# Patient Record
Sex: Female | Born: 1945 | Race: Asian | Hispanic: No | Marital: Married | Smoking: Never smoker
Health system: Southern US, Community
[De-identification: ages and names within clinical notes are randomized; demographics above are authoritative.]

## PROBLEM LIST (undated history)

## (undated) DIAGNOSIS — J45909 Unspecified asthma, uncomplicated: Secondary | ICD-10-CM

## (undated) DIAGNOSIS — Z972 Presence of dental prosthetic device (complete) (partial): Secondary | ICD-10-CM

## (undated) DIAGNOSIS — I10 Essential (primary) hypertension: Secondary | ICD-10-CM

## (undated) DIAGNOSIS — H269 Unspecified cataract: Secondary | ICD-10-CM

## (undated) DIAGNOSIS — K219 Gastro-esophageal reflux disease without esophagitis: Secondary | ICD-10-CM

## (undated) HISTORY — DX: Unspecified asthma, uncomplicated: J45.909

## (undated) HISTORY — PX: EYE SURGERY: SHX253

## (undated) HISTORY — PX: KNEE ARTHROSCOPY: SUR90

## (undated) HISTORY — PX: TUBAL LIGATION: SHX77

## (undated) HISTORY — DX: Unspecified cataract: H26.9

## (undated) HISTORY — DX: Essential (primary) hypertension: I10

## (undated) HISTORY — PX: FOOT SURGERY: SHX648

---

## 2004-08-13 ENCOUNTER — Ambulatory Visit: Payer: Self-pay | Admitting: Unknown Physician Specialty

## 2005-10-07 ENCOUNTER — Ambulatory Visit: Payer: Self-pay | Admitting: Internal Medicine

## 2006-03-24 ENCOUNTER — Ambulatory Visit: Payer: Self-pay | Admitting: Internal Medicine

## 2006-05-26 ENCOUNTER — Ambulatory Visit: Payer: Self-pay | Admitting: Gastroenterology

## 2006-10-12 ENCOUNTER — Ambulatory Visit: Payer: Self-pay | Admitting: Internal Medicine

## 2006-11-21 ENCOUNTER — Emergency Department: Payer: Self-pay | Admitting: Emergency Medicine

## 2007-10-16 ENCOUNTER — Ambulatory Visit: Payer: Self-pay | Admitting: Internal Medicine

## 2008-10-18 ENCOUNTER — Ambulatory Visit: Payer: Self-pay | Admitting: Internal Medicine

## 2008-12-20 ENCOUNTER — Ambulatory Visit: Payer: Self-pay | Admitting: Internal Medicine

## 2009-11-02 ENCOUNTER — Emergency Department: Payer: Self-pay | Admitting: Emergency Medicine

## 2009-12-04 ENCOUNTER — Ambulatory Visit: Payer: Self-pay | Admitting: Unknown Physician Specialty

## 2010-01-06 ENCOUNTER — Ambulatory Visit: Payer: Self-pay | Admitting: Unknown Physician Specialty

## 2010-02-05 ENCOUNTER — Ambulatory Visit: Payer: Self-pay | Admitting: Internal Medicine

## 2010-07-20 ENCOUNTER — Ambulatory Visit: Payer: Self-pay

## 2011-08-19 ENCOUNTER — Ambulatory Visit: Payer: Self-pay | Admitting: Obstetrics and Gynecology

## 2012-11-07 ENCOUNTER — Ambulatory Visit: Payer: Self-pay | Admitting: Obstetrics and Gynecology

## 2012-11-28 ENCOUNTER — Ambulatory Visit: Payer: Self-pay | Admitting: Obstetrics and Gynecology

## 2012-12-19 ENCOUNTER — Ambulatory Visit: Payer: Self-pay | Admitting: Obstetrics and Gynecology

## 2013-01-29 ENCOUNTER — Ambulatory Visit: Payer: Self-pay | Admitting: Internal Medicine

## 2013-06-13 ENCOUNTER — Ambulatory Visit: Payer: Self-pay | Admitting: Surgery

## 2013-12-20 ENCOUNTER — Ambulatory Visit: Payer: Self-pay | Admitting: Internal Medicine

## 2014-02-06 ENCOUNTER — Ambulatory Visit: Payer: Self-pay | Admitting: Internal Medicine

## 2014-09-27 ENCOUNTER — Emergency Department: Payer: Self-pay | Admitting: Emergency Medicine

## 2014-10-08 ENCOUNTER — Emergency Department: Payer: Self-pay | Admitting: Emergency Medicine

## 2014-12-11 ENCOUNTER — Ambulatory Visit
Admit: 2014-12-11 | Disposition: A | Discharge status: Home / Self Care | Payer: Self-pay | Attending: Internal Medicine | Admitting: Internal Medicine

## 2015-03-10 HISTORY — PX: TEAR DUCT PROBING: SHX793

## 2015-03-11 ENCOUNTER — Ambulatory Visit
Admission: RE | Admit: 2015-03-11 | Discharge: 2015-03-11 | Disposition: A | Discharge status: Home / Self Care | Payer: PPO | Source: Ambulatory Visit | Attending: Internal Medicine | Admitting: Internal Medicine

## 2015-03-11 ENCOUNTER — Other Ambulatory Visit: Payer: Self-pay | Admitting: Internal Medicine

## 2015-03-11 ENCOUNTER — Ambulatory Visit
Admission: RE | Admit: 2015-03-11 | Discharge: 2015-03-11 | Disposition: A | Discharge status: Home / Self Care | Payer: PPO | Source: Ambulatory Visit | Attending: Cardiology | Admitting: Cardiology

## 2015-03-11 DIAGNOSIS — M25561 Pain in right knee: Secondary | ICD-10-CM | POA: Diagnosis present

## 2015-03-11 DIAGNOSIS — M1711 Unilateral primary osteoarthritis, right knee: Secondary | ICD-10-CM | POA: Insufficient documentation

## 2015-03-11 DIAGNOSIS — M898X5 Other specified disorders of bone, thigh: Secondary | ICD-10-CM

## 2015-03-11 DIAGNOSIS — R05 Cough: Secondary | ICD-10-CM

## 2015-03-11 DIAGNOSIS — R059 Cough, unspecified: Secondary | ICD-10-CM

## 2015-03-11 DIAGNOSIS — M79604 Pain in right leg: Secondary | ICD-10-CM | POA: Diagnosis present

## 2015-03-13 DIAGNOSIS — K219 Gastro-esophageal reflux disease without esophagitis: Secondary | ICD-10-CM | POA: Insufficient documentation

## 2015-03-13 DIAGNOSIS — I10 Essential (primary) hypertension: Secondary | ICD-10-CM | POA: Insufficient documentation

## 2015-04-24 ENCOUNTER — Ambulatory Visit (INDEPENDENT_AMBULATORY_CARE_PROVIDER_SITE_OTHER): Payer: PPO | Admitting: Obstetrics and Gynecology

## 2015-04-24 ENCOUNTER — Encounter: Payer: Self-pay | Admitting: Obstetrics and Gynecology

## 2015-04-24 VITALS — BP 170/73 | HR 93 | Resp 16 | Ht 63.0 in | Wt 170.5 lb

## 2015-04-24 DIAGNOSIS — N814 Uterovaginal prolapse, unspecified: Secondary | ICD-10-CM | POA: Diagnosis not present

## 2015-04-24 DIAGNOSIS — Z4689 Encounter for fitting and adjustment of other specified devices: Secondary | ICD-10-CM | POA: Diagnosis not present

## 2015-04-24 NOTE — Progress Notes (Signed)
GYNECOLOGY PROGRESS NOTE  Subjective:    Patient ID: Amanda Cruz, female    DOB: 1945/09/21, 69 y.o.   MRN: [id number]  HPI  Patient is a 69 y.o. No obstetric history on file. female who presents for a pessary check. She reports no vaginal bleeding or discharge. She denies pelvic discomfort and difficulty urinating or moving her bowels. ?  The following portions of the patient's history were reviewed and updated as appropriate: allergies, current medications, past family history, past medical history, past social history, past surgical history and problem list.  Review of Systems A comprehensive review of systems was negative.   Objective:   There were no vitals taken for this visit. The patient's Size 4 ring pessary with support was removed, cleaned and replaced without complications. Speculum examination revealed normal vaginal mucosa with no lesions or lacerations.    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  Rubie Maid, MD [facility]

## 2015-07-03 ENCOUNTER — Ambulatory Visit: Payer: PPO | Admitting: Obstetrics and Gynecology

## 2015-10-16 ENCOUNTER — Ambulatory Visit (INDEPENDENT_AMBULATORY_CARE_PROVIDER_SITE_OTHER): Payer: PPO | Admitting: Obstetrics and Gynecology

## 2015-10-16 ENCOUNTER — Encounter: Payer: Self-pay | Admitting: Obstetrics and Gynecology

## 2015-10-16 VITALS — BP 184/85 | HR 102 | Ht 63.0 in | Wt 166.3 lb

## 2015-10-16 DIAGNOSIS — I83899 Varicose veins of unspecified lower extremities with other complications: Secondary | ICD-10-CM | POA: Diagnosis not present

## 2015-10-16 DIAGNOSIS — N950 Postmenopausal bleeding: Secondary | ICD-10-CM

## 2015-10-16 DIAGNOSIS — R05 Cough: Secondary | ICD-10-CM | POA: Diagnosis not present

## 2015-10-16 DIAGNOSIS — R0689 Other abnormalities of breathing: Secondary | ICD-10-CM | POA: Diagnosis not present

## 2015-10-16 DIAGNOSIS — R0600 Dyspnea, unspecified: Secondary | ICD-10-CM | POA: Diagnosis not present

## 2015-10-16 NOTE — Progress Notes (Signed)
    GYNECOLOGY PROGRESS NOTE  Subjective:    Patient ID: Amanda Cruz, female    DOB: 10/28/1945, 70 y.o.   MRN: [id number]  HPI  Patient is a 70 y.o. Marland Kitchen female who presents for complaints of PMB. Patient with pessary in place for cystocele (Stage III) with uterine prolapse (Stage II). Has not had pessary checked in 6 months due to caring for family member with ailments. Notes that the bleeding has been ongoing x 1 month.  Bleeding is described as spotting to light bleeding, occuring almost daily.  Denies any pelvic pain, passage of clots, dysuria or hematuria. Notes that she does not use her Trimo-San gel regularly as prescribed  The following portions of the patient's history were reviewed and updated as appropriate: allergies, current medications, past family history, past medical history, past social history, past surgical history and problem list.  Review of Systems A comprehensive review of systems was negative except for: Respiratory: positive for cough   Objective:   Blood pressure 184/85, pulse 102, height 5\' 3"  (1.6 m), weight 166 lb 4.8 oz (75.433 kg). General appearance: alert and no distress Abdomen: soft, non-tender; bowel sounds normal; no masses,  no organomegaly Pelvic:  External genitalia was normal.  The patient's Size 4 ring pessary with support was removed and cleaned. Speculum examination revealed atrophic but otherwise normal vaginal mucosa with no lesions or lacerations.  Cystocele Grade III present. Cervix appeared friable with small amount of blood.  Uterine prolapse (Grade II).  Uterus normal size, shape, consistency.  Adnexae non-palpable.  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  Will order pelvic ultrasound to assess uterine lining; if thickened, will need endometrial biopsy.  Will also order TSH and CBC.  Removed pessary to allow cervix time to heal. Patient to f/u in 2 weeks.  If cervix no longer friable, will reinsert.   Rubie Maid, MD [facility]

## 2015-10-17 LAB — CBC
HEMATOCRIT: 37.6 % (ref 34.0–46.6)
HEMOGLOBIN: 13 g/dL (ref 11.1–15.9)
MCH: 30.1 pg (ref 26.6–33.0)
MCHC: 34.6 g/dL (ref 31.5–35.7)
MCV: 87 fL (ref 79–97)
NRBC: 0 % (ref 0–0)
Platelets: 189 10*3/uL (ref 150–379)
RBC: 4.32 x10E6/uL (ref 3.77–5.28)
RDW: 13.1 % (ref 12.3–15.4)
WBC: 4 10*3/uL (ref 3.4–10.8)

## 2015-10-17 LAB — TSH: TSH: 1.52 u[IU]/mL (ref 0.450–4.500)

## 2015-11-04 ENCOUNTER — Ambulatory Visit (INDEPENDENT_AMBULATORY_CARE_PROVIDER_SITE_OTHER): Payer: PPO | Admitting: Obstetrics and Gynecology

## 2015-11-04 ENCOUNTER — Ambulatory Visit (INDEPENDENT_AMBULATORY_CARE_PROVIDER_SITE_OTHER): Payer: PPO

## 2015-11-04 ENCOUNTER — Encounter: Payer: Self-pay | Admitting: Obstetrics and Gynecology

## 2015-11-04 VITALS — BP 169/73 | HR 80 | Ht 63.0 in | Wt 167.1 lb

## 2015-11-04 DIAGNOSIS — N950 Postmenopausal bleeding: Secondary | ICD-10-CM

## 2015-11-04 DIAGNOSIS — D259 Leiomyoma of uterus, unspecified: Secondary | ICD-10-CM

## 2015-11-04 DIAGNOSIS — N814 Uterovaginal prolapse, unspecified: Secondary | ICD-10-CM | POA: Diagnosis not present

## 2015-11-04 DIAGNOSIS — N952 Postmenopausal atrophic vaginitis: Secondary | ICD-10-CM | POA: Diagnosis not present

## 2015-11-04 MED ORDER — OXYQUINOLONE SULFATE 0.025 % VA GEL: 1.0000 | VAGINAL | Status: DC

## 2015-11-09 NOTE — Progress Notes (Signed)
GYNECOLOGY PROGRESS NOTE  Subjective:    Patient ID: Amanda Cruz, female    DOB: 1945-12-16, 70 y.o.   MRN: [id number]  HPI  Patient is a 70 y.o. Marland Kitchen female who presents for f/u of ultrasound for complaints of PMB. Patient was instructed last visit to leave pessary out x 2 weeks in case it was causing vaginal irritation and bleeding.  Notes that she left if out for ~ 1 week, but was having difficulty voiding without it, so she reinserted it.  Has not had any further bleeding episodes.  Reported last visit that she does not use her Trimo-San gel regularly as prescribed, and had left pessary in place x 6 months without having it removed and cleaned.   The following portions of the patient's history were reviewed and updated as appropriate: allergies, current medications, past family history, past medical history, past social history, past surgical history and problem list.  Review of Systems A comprehensive review of systems was negative except for: Respiratory: positive for cough   Objective:   Blood pressure 169/73, pulse 80, height 5\' 3"  (1.6 m), weight 167 lb 1.6 oz (75.796 kg). General appearance: alert and no distress Abdomen: soft, non-tender; bowel sounds normal; no masses,  no organomegaly Pelvic:  External genitalia was normal.   Speculum examination revealed atrophic but otherwise normal vaginal mucosa with no lesions or lacerations.  No blood in vaginal vault.  Cystocele Grade III present. Cervix appeared less friable. Uterine prolapse (Grade II).  Uterus normal size, shape, consistency.  Adnexae non-palpable.  Extremities: extremities normal, atraumatic, no cyanosis or edema and venous stasis dermatitis noted on right lower leg Neurologic: Grossly normal    Labs:  Results for orders placed or performed in visit on 10/16/15  TSH  Result Value Ref Range   TSH 1.520 0.450 - 4.500 uIU/mL  CBC  Result Value Ref Range   WBC 4.0 3.4 - 10.8 x10E3/uL   RBC 4.32 3.77 - 5.28  x10E6/uL   Hemoglobin 13.0 11.1 - 15.9 g/dL   Hematocrit 37.6 34.0 - 46.6 %   MCV 87 79 - 97 fL   MCH 30.1 26.6 - 33.0 pg   MCHC 34.6 31.5 - 35.7 g/dL   RDW 13.1 12.3 - 15.4 %   Platelets 189 150 - 379 x10E3/uL   NRBC 0 0-0 %    Imaging 11/04/2015:  Indications:PMB Findings:  The uterus measures 5.8 x 2.1 x 3.7 cm. Echo texture is heterogenous with evidence of focal mass. Within the uterus is a suspected fibroid measuring: Fibroid 1: left lower uterine segment 1 x 1 x .9cm The Endometrium measures 4.9 mm.  Ovaries are not visualized.  Survey of the adnexa demonstrates no adnexal masses.  There is no free fluid in the cul de sac.  Impression: 1. Fibroid left LUS 2. Endometrium 4.9 mm   Recommendations: 1.Clinical correlation with the patient's History and Physical Exam.   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  No need for endometrial biopsy at this time. TSH and CBC also normal.  Discussed need to use Trimo-san gel 1-2 x weekly. Still declines use of estrogen cream for pessary maintenance and treatment for vaginal atrophy. Reinserted vaginal pessary (  size 4 ring with support) today.  RTC in 2-3 months for next pessary check.    A total of 15 minutes were spent face-to-face with the patient during this encounter and over half of that time dealt with counseling and coordination of care.   Rubie Maid, MD [facility]

## 2016-01-21 DIAGNOSIS — M170 Bilateral primary osteoarthritis of knee: Secondary | ICD-10-CM | POA: Diagnosis not present

## 2016-02-05 ENCOUNTER — Ambulatory Visit (INDEPENDENT_AMBULATORY_CARE_PROVIDER_SITE_OTHER): Payer: PPO | Admitting: Obstetrics and Gynecology

## 2016-02-05 ENCOUNTER — Encounter: Payer: Self-pay | Admitting: Obstetrics and Gynecology

## 2016-02-05 VITALS — BP 168/70 | HR 88 | Ht 63.0 in | Wt 165.5 lb

## 2016-02-05 DIAGNOSIS — Z4689 Encounter for fitting and adjustment of other specified devices: Secondary | ICD-10-CM | POA: Diagnosis not present

## 2016-02-05 DIAGNOSIS — F4321 Adjustment disorder with depressed mood: Secondary | ICD-10-CM

## 2016-02-05 DIAGNOSIS — Z634 Disappearance and death of family member: Secondary | ICD-10-CM

## 2016-02-08 NOTE — Progress Notes (Signed)
   GYNECOLOGY PROGRESS NOTE  Subjective:    Patient ID: Amanda Cruz, female    DOB: [birth year], 70 y.o.   MRN: [id number]  HPI  Patient is a 70 y.o. female who presents for a pessary check. She reports no vaginal bleeding or discharge. She denies pelvic discomfort and difficulty urinating or moving her bowels. ?  The following portions of the patient's history were reviewed and updated as appropriate: allergies, current medications, past family history, past medical history, past social history, past surgical history and problem list.   Review of Systems Behavioral/Psych: positive for sadness and grief.  Notes that her son recently passed away ~ 6 weeks ago   Objective:   Blood pressure 168/70, pulse 88, height 5\' 3"  (1.6 m), weight 165 lb 8 oz (75.07 kg). Gen App: NAD, sad Pelvis:The patient's Size 4 ring pessary with support was removed, cleaned and replaced without complications. Speculum examination revealed normal vaginal mucosa with no lesions or lacerations. Psych: normal though process, no agitation, no SI/HI, tearful when discussing her son.     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 Patient currently grieving, no evidence of depression at this time.  Offered comfort to patient, advised to notify MD if symptoms change, she begins having harmful thoughts, unable to perform ADL due to sadness/depression.     Rubie Maid, MD [facility]

## 2016-07-28 DIAGNOSIS — H2513 Age-related nuclear cataract, bilateral: Secondary | ICD-10-CM | POA: Diagnosis not present

## 2017-06-28 DIAGNOSIS — M7711 Lateral epicondylitis, right elbow: Secondary | ICD-10-CM | POA: Diagnosis not present

## 2017-06-28 DIAGNOSIS — I83899 Varicose veins of unspecified lower extremities with other complications: Secondary | ICD-10-CM | POA: Diagnosis not present

## 2017-06-28 DIAGNOSIS — I10 Essential (primary) hypertension: Secondary | ICD-10-CM | POA: Diagnosis not present

## 2017-06-28 DIAGNOSIS — Z23 Encounter for immunization: Secondary | ICD-10-CM | POA: Diagnosis not present

## 2017-06-28 DIAGNOSIS — R0600 Dyspnea, unspecified: Secondary | ICD-10-CM | POA: Diagnosis not present

## 2017-07-04 DIAGNOSIS — L309 Dermatitis, unspecified: Secondary | ICD-10-CM | POA: Diagnosis not present

## 2017-07-12 DIAGNOSIS — I8312 Varicose veins of left lower extremity with inflammation: Secondary | ICD-10-CM | POA: Diagnosis not present

## 2017-07-12 DIAGNOSIS — I10 Essential (primary) hypertension: Secondary | ICD-10-CM | POA: Diagnosis not present

## 2017-07-12 DIAGNOSIS — I8311 Varicose veins of right lower extremity with inflammation: Secondary | ICD-10-CM | POA: Diagnosis not present

## 2017-07-12 DIAGNOSIS — M7711 Lateral epicondylitis, right elbow: Secondary | ICD-10-CM | POA: Diagnosis not present

## 2017-08-01 DIAGNOSIS — I10 Essential (primary) hypertension: Secondary | ICD-10-CM | POA: Diagnosis not present

## 2017-08-01 DIAGNOSIS — E7849 Other hyperlipidemia: Secondary | ICD-10-CM | POA: Diagnosis not present

## 2017-08-01 DIAGNOSIS — R5381 Other malaise: Secondary | ICD-10-CM | POA: Diagnosis not present

## 2017-08-01 DIAGNOSIS — E119 Type 2 diabetes mellitus without complications: Secondary | ICD-10-CM | POA: Diagnosis not present

## 2017-08-04 DIAGNOSIS — Z0000 Encounter for general adult medical examination without abnormal findings: Secondary | ICD-10-CM | POA: Diagnosis not present

## 2017-08-15 DIAGNOSIS — L209 Atopic dermatitis, unspecified: Secondary | ICD-10-CM | POA: Diagnosis not present

## 2017-08-15 DIAGNOSIS — I8393 Asymptomatic varicose veins of bilateral lower extremities: Secondary | ICD-10-CM | POA: Diagnosis not present

## 2017-08-15 DIAGNOSIS — I781 Nevus, non-neoplastic: Secondary | ICD-10-CM | POA: Diagnosis not present

## 2017-09-26 DIAGNOSIS — R21 Rash and other nonspecific skin eruption: Secondary | ICD-10-CM | POA: Diagnosis not present

## 2017-09-26 DIAGNOSIS — L299 Pruritus, unspecified: Secondary | ICD-10-CM | POA: Diagnosis not present

## 2017-09-26 DIAGNOSIS — D485 Neoplasm of uncertain behavior of skin: Secondary | ICD-10-CM | POA: Diagnosis not present

## 2017-09-26 DIAGNOSIS — L309 Dermatitis, unspecified: Secondary | ICD-10-CM | POA: Diagnosis not present

## 2017-10-12 DIAGNOSIS — I8311 Varicose veins of right lower extremity with inflammation: Secondary | ICD-10-CM | POA: Diagnosis not present

## 2017-10-12 DIAGNOSIS — B353 Tinea pedis: Secondary | ICD-10-CM | POA: Diagnosis not present

## 2017-10-12 DIAGNOSIS — L578 Other skin changes due to chronic exposure to nonionizing radiation: Secondary | ICD-10-CM | POA: Diagnosis not present

## 2017-10-13 DIAGNOSIS — I83899 Varicose veins of unspecified lower extremities with other complications: Secondary | ICD-10-CM | POA: Diagnosis not present

## 2017-10-13 DIAGNOSIS — I10 Essential (primary) hypertension: Secondary | ICD-10-CM | POA: Diagnosis not present

## 2017-10-13 DIAGNOSIS — R0600 Dyspnea, unspecified: Secondary | ICD-10-CM | POA: Diagnosis not present

## 2017-10-13 DIAGNOSIS — J441 Chronic obstructive pulmonary disease with (acute) exacerbation: Secondary | ICD-10-CM | POA: Diagnosis not present

## 2017-10-20 DIAGNOSIS — H2513 Age-related nuclear cataract, bilateral: Secondary | ICD-10-CM | POA: Diagnosis not present

## 2017-11-03 DIAGNOSIS — J441 Chronic obstructive pulmonary disease with (acute) exacerbation: Secondary | ICD-10-CM | POA: Diagnosis not present

## 2017-11-03 DIAGNOSIS — I10 Essential (primary) hypertension: Secondary | ICD-10-CM | POA: Diagnosis not present

## 2017-11-03 DIAGNOSIS — I8311 Varicose veins of right lower extremity with inflammation: Secondary | ICD-10-CM | POA: Diagnosis not present

## 2017-11-03 DIAGNOSIS — R0600 Dyspnea, unspecified: Secondary | ICD-10-CM | POA: Diagnosis not present

## 2017-12-20 DIAGNOSIS — R0600 Dyspnea, unspecified: Secondary | ICD-10-CM | POA: Diagnosis not present

## 2017-12-20 DIAGNOSIS — I10 Essential (primary) hypertension: Secondary | ICD-10-CM | POA: Diagnosis not present

## 2017-12-20 DIAGNOSIS — J441 Chronic obstructive pulmonary disease with (acute) exacerbation: Secondary | ICD-10-CM | POA: Diagnosis not present

## 2017-12-20 DIAGNOSIS — I8311 Varicose veins of right lower extremity with inflammation: Secondary | ICD-10-CM | POA: Diagnosis not present

## 2017-12-21 DIAGNOSIS — R5381 Other malaise: Secondary | ICD-10-CM | POA: Diagnosis not present

## 2017-12-21 DIAGNOSIS — R5383 Other fatigue: Secondary | ICD-10-CM | POA: Diagnosis not present

## 2017-12-21 DIAGNOSIS — R531 Weakness: Secondary | ICD-10-CM | POA: Diagnosis not present

## 2017-12-21 DIAGNOSIS — R109 Unspecified abdominal pain: Secondary | ICD-10-CM | POA: Diagnosis not present

## 2017-12-23 DIAGNOSIS — I8311 Varicose veins of right lower extremity with inflammation: Secondary | ICD-10-CM | POA: Diagnosis not present

## 2017-12-23 DIAGNOSIS — M25562 Pain in left knee: Secondary | ICD-10-CM | POA: Diagnosis not present

## 2017-12-23 DIAGNOSIS — J441 Chronic obstructive pulmonary disease with (acute) exacerbation: Secondary | ICD-10-CM | POA: Diagnosis not present

## 2017-12-23 DIAGNOSIS — R0600 Dyspnea, unspecified: Secondary | ICD-10-CM | POA: Diagnosis not present

## 2017-12-23 DIAGNOSIS — I10 Essential (primary) hypertension: Secondary | ICD-10-CM | POA: Diagnosis not present

## 2018-01-13 DIAGNOSIS — M25562 Pain in left knee: Secondary | ICD-10-CM | POA: Diagnosis not present

## 2018-01-13 DIAGNOSIS — I8311 Varicose veins of right lower extremity with inflammation: Secondary | ICD-10-CM | POA: Diagnosis not present

## 2018-01-13 DIAGNOSIS — I10 Essential (primary) hypertension: Secondary | ICD-10-CM | POA: Diagnosis not present

## 2018-01-13 DIAGNOSIS — R0600 Dyspnea, unspecified: Secondary | ICD-10-CM | POA: Diagnosis not present

## 2018-03-27 DIAGNOSIS — I8311 Varicose veins of right lower extremity with inflammation: Secondary | ICD-10-CM | POA: Diagnosis not present

## 2018-03-27 DIAGNOSIS — R0600 Dyspnea, unspecified: Secondary | ICD-10-CM | POA: Diagnosis not present

## 2018-03-27 DIAGNOSIS — M25562 Pain in left knee: Secondary | ICD-10-CM | POA: Diagnosis not present

## 2018-03-27 DIAGNOSIS — Z0000 Encounter for general adult medical examination without abnormal findings: Secondary | ICD-10-CM | POA: Diagnosis not present

## 2018-03-27 DIAGNOSIS — J441 Chronic obstructive pulmonary disease with (acute) exacerbation: Secondary | ICD-10-CM | POA: Diagnosis not present

## 2018-03-28 DIAGNOSIS — E119 Type 2 diabetes mellitus without complications: Secondary | ICD-10-CM | POA: Diagnosis not present

## 2018-03-28 DIAGNOSIS — R5381 Other malaise: Secondary | ICD-10-CM | POA: Diagnosis not present

## 2018-03-28 DIAGNOSIS — R531 Weakness: Secondary | ICD-10-CM | POA: Diagnosis not present

## 2018-03-31 ENCOUNTER — Other Ambulatory Visit: Payer: Self-pay

## 2018-03-31 DIAGNOSIS — Z1211 Encounter for screening for malignant neoplasm of colon: Secondary | ICD-10-CM

## 2018-04-24 DIAGNOSIS — R0600 Dyspnea, unspecified: Secondary | ICD-10-CM | POA: Diagnosis not present

## 2018-04-24 DIAGNOSIS — I8311 Varicose veins of right lower extremity with inflammation: Secondary | ICD-10-CM | POA: Diagnosis not present

## 2018-04-24 DIAGNOSIS — J441 Chronic obstructive pulmonary disease with (acute) exacerbation: Secondary | ICD-10-CM | POA: Diagnosis not present

## 2018-04-24 DIAGNOSIS — M25562 Pain in left knee: Secondary | ICD-10-CM | POA: Diagnosis not present

## 2018-05-08 ENCOUNTER — Encounter: Payer: Self-pay | Admitting: *Deleted

## 2018-05-09 ENCOUNTER — Ambulatory Visit: Payer: PPO | Admitting: Certified Registered"

## 2018-05-09 ENCOUNTER — Ambulatory Visit
Admission: RE | Admit: 2018-05-09 | Discharge: 2018-05-09 | Disposition: A | Discharge status: Home / Self Care | Payer: PPO | Source: Ambulatory Visit | Attending: Gastroenterology | Admitting: Gastroenterology

## 2018-05-09 ENCOUNTER — Encounter: Payer: Self-pay | Admitting: Emergency Medicine

## 2018-05-09 ENCOUNTER — Encounter
Admission: RE | Disposition: A | Discharge status: Home / Self Care | Payer: Self-pay | Source: Ambulatory Visit | Attending: Gastroenterology

## 2018-05-09 DIAGNOSIS — Z1211 Encounter for screening for malignant neoplasm of colon: Secondary | ICD-10-CM | POA: Diagnosis not present

## 2018-05-09 DIAGNOSIS — I10 Essential (primary) hypertension: Secondary | ICD-10-CM | POA: Insufficient documentation

## 2018-05-09 DIAGNOSIS — K219 Gastro-esophageal reflux disease without esophagitis: Secondary | ICD-10-CM | POA: Diagnosis not present

## 2018-05-09 DIAGNOSIS — K5790 Diverticulosis of intestine, part unspecified, without perforation or abscess without bleeding: Secondary | ICD-10-CM | POA: Diagnosis not present

## 2018-05-09 DIAGNOSIS — K640 First degree hemorrhoids: Secondary | ICD-10-CM | POA: Diagnosis not present

## 2018-05-09 DIAGNOSIS — K5730 Diverticulosis of large intestine without perforation or abscess without bleeding: Secondary | ICD-10-CM | POA: Insufficient documentation

## 2018-05-09 DIAGNOSIS — D125 Benign neoplasm of sigmoid colon: Secondary | ICD-10-CM

## 2018-05-09 DIAGNOSIS — K635 Polyp of colon: Secondary | ICD-10-CM

## 2018-05-09 DIAGNOSIS — Z0000 Encounter for general adult medical examination without abnormal findings: Secondary | ICD-10-CM

## 2018-05-09 HISTORY — PX: COLONOSCOPY WITH PROPOFOL: SHX5780

## 2018-05-09 SURGERY — COLONOSCOPY WITH PROPOFOL
Anesthesia: General

## 2018-05-09 MED ORDER — SODIUM CHLORIDE 0.9 % IV SOLN
INTRAVENOUS | Status: DC
  Administered 2018-05-09: 1000 mL via INTRAVENOUS

## 2018-05-09 MED ORDER — PROPOFOL 10 MG/ML IV BOLUS
INTRAVENOUS | Status: DC | PRN
  Administered 2018-05-09: 20 mg via INTRAVENOUS
  Administered 2018-05-09: 70 mg via INTRAVENOUS

## 2018-05-09 MED ORDER — PROPOFOL 500 MG/50ML IV EMUL
INTRAVENOUS | Status: DC | PRN
  Administered 2018-05-09: 120 ug/kg/min via INTRAVENOUS

## 2018-05-09 MED ORDER — FENTANYL CITRATE (PF) 100 MCG/2ML IJ SOLN: 25.0000 ug | INTRAMUSCULAR | Status: DC | PRN

## 2018-05-09 MED ORDER — PROPOFOL 500 MG/50ML IV EMUL
INTRAVENOUS | Status: AC
  Filled 2018-05-09: qty 50

## 2018-05-09 MED ORDER — ONDANSETRON HCL 4 MG/2ML IJ SOLN: 4.0000 mg | Freq: Once | INTRAMUSCULAR | Status: DC | PRN

## 2018-05-09 MED ORDER — LIDOCAINE HCL (CARDIAC) PF 100 MG/5ML IV SOSY
PREFILLED_SYRINGE | INTRAVENOUS | Status: DC | PRN
  Administered 2018-05-09: 50 mg via INTRAVENOUS

## 2018-05-09 NOTE — Anesthesia Procedure Notes (Signed)
Performed by: Xai Frerking, CRNA Pre-anesthesia Checklist: Patient identified, Emergency Drugs available, Suction available, Patient being monitored and Timeout performed Patient Re-evaluated:Patient Re-evaluated prior to induction Oxygen Delivery Method: Nasal cannula Induction Type: IV induction       

## 2018-05-09 NOTE — H&P (Signed)
[name], MD [address] [place], [place] [address] Phone: [phone] Fax : [phone]  Primary Care Physician:  [name], MD Primary Gastroenterologist:  Dr. Allen Norris  Pre-Procedure History & Physical: HPI:  Amanda Cruz is a 72 y.o. female is here for a screening colonoscopy.   Past Medical History:  Diagnosis Date  . Hypertension     Past Surgical History:  Procedure Laterality Date  . TEAR DUCT PROBING  03/10/2015  . TUBAL LIGATION      Prior to Admission medications   Medication Sig Start Date End Date Taking? Authorizing Provider  metoprolol (LOPRESSOR) 50 MG tablet  10/03/15  Yes [provider]  metoprolol succinate (TOPROL-XL) 50 MG 24 hr tablet Take 50 mg by mouth.   Yes [provider]  OXYQUINOLONE SULFATE VAGINAL (TRIMO-SAN) 0.025 % GEL Place 1 Applicatorful vaginally 2 (two) times a week. 11/04/15  Yes Rubie Maid, MD    Allergies as of 03/31/2018  . (No Known Allergies)    Family History  Problem Relation Age of Onset  . Stroke Mother   . Heart failure Mother   . Thyroid disease Paternal Grandfather     Social History   Socioeconomic History  . Marital status: Married    Spouse name: Not on file  . Number of children: Not on file  . Years of education: Not on file  . Highest education level: Not on file  Occupational History  . Not on file  Social Needs  . Financial resource strain: Somewhat hard  . Food insecurity:    Worry: Never true    Inability: Never true  . Transportation needs:    Medical: No    Non-medical: No  Tobacco Use  . Smoking status: Never Smoker  . Smokeless tobacco: Never Used  Substance and Sexual Activity  . Alcohol use: Yes    Alcohol/week: 1.0 standard drinks    Types: 1 Glasses of wine per week  . Drug use: No  . Sexual activity: Not Currently    Birth control/protection: Post-menopausal  Lifestyle  . Physical activity:    Days per week: 0 days    Minutes per  session: 0 min  . Stress: To some extent  Relationships  . Social connections:    Talks on phone: Patient refused    Gets together: Patient refused    Attends religious service: Patient refused    Active member of club or organization: Patient refused    Attends meetings of clubs or organizations: Patient refused    Relationship status: Patient refused  . Intimate partner violence:    Fear of current or ex partner: Patient refused    Emotionally abused: Patient refused    Physically abused: Patient refused    Forced sexual activity: Patient refused  Other Topics Concern  . Not on file  Social History Narrative  . Not on file    Review of Systems: See HPI, otherwise negative ROS  Physical Exam: BP (!) 170/86   Pulse 72   Temp (!) 96.3 F (35.7 C) (Tympanic)   Resp 18   Ht 5\' 3"  (1.6 m)   Wt 75.8 kg   SpO2 97%   BMI 29.58 kg/m  General:   Alert,  pleasant and cooperative in NAD Head:  Normocephalic and atraumatic. Neck:  Supple; no masses or thyromegaly. Lungs:  Clear throughout to auscultation.    Heart:  Regular rate and rhythm. Abdomen:  Soft, nontender and nondistended. Normal bowel sounds,  without guarding, and without rebound.   Neurologic:  Alert and  oriented x4;  grossly normal neurologically.  Impression/Plan: Amanda Cruz is now here to undergo a screening colonoscopy.  Risks, benefits, and alternatives regarding colonoscopy have been reviewed with the patient.  Questions have been answered.  All parties agreeable.

## 2018-05-09 NOTE — Anesthesia Preprocedure Evaluation (Addendum)
Anesthesia Evaluation  Patient identified by MRN, date of birth, ID band Patient awake    Reviewed: Allergy & Precautions, NPO status , Patient's Chart, lab work & pertinent test results, reviewed documented beta blocker date and time   Airway Mallampati: III       Dental  (+) Upper Dentures   Pulmonary neg pulmonary ROS,    Pulmonary exam normal        Cardiovascular hypertension, Pt. on medications and Pt. on home beta blockers Normal cardiovascular exam     Neuro/Psych negative neurological ROS  negative psych ROS   GI/Hepatic Neg liver ROS, GERD  Medicated,  Endo/Other  negative endocrine ROS  Renal/GU negative Renal ROS  negative genitourinary   Musculoskeletal negative musculoskeletal ROS (+)   Abdominal Normal abdominal exam  (+)   Peds negative pediatric ROS (+)  Hematology negative hematology ROS (+)   Anesthesia Other Findings   Reproductive/Obstetrics                            Anesthesia Physical Anesthesia Plan  ASA: II  Anesthesia Plan: General   Post-op Pain Management:    Induction: Intravenous  PONV Risk Score and Plan: Propofol infusion  Airway Management Planned: Nasal Cannula  Additional Equipment:   Intra-op Plan:   Post-operative Plan:   Informed Consent: I have reviewed the patients History and Physical, chart, labs and discussed the procedure including the risks, benefits and alternatives for the proposed anesthesia with the patient or authorized representative who has indicated his/her understanding and acceptance.   Dental advisory given  Plan Discussed with: CRNA and Surgeon  Anesthesia Plan Comments:         Anesthesia Quick Evaluation

## 2018-05-09 NOTE — Anesthesia Post-op Follow-up Note (Signed)
Anesthesia QCDR form completed.        

## 2018-05-09 NOTE — Transfer of Care (Signed)
Immediate Anesthesia Transfer of Care Note  Patient: Amanda Cruz  Procedure(s) Performed: COLONOSCOPY WITH PROPOFOL (N/A )  Patient Location: PACU  Anesthesia Type:General  Level of Consciousness: sedated  Airway & Oxygen Therapy: Patient Spontanous Breathing and Patient connected to nasal cannula oxygen  Post-op Assessment: Report given to RN and Post -op Vital signs reviewed and stable  Post vital signs: Reviewed and stable  Last Vitals:  Vitals Value Taken Time  BP 106/53 05/09/2018  9:06 AM  Temp 35.7 C 05/09/2018  9:00 AM  Pulse 70 05/09/2018  9:06 AM  Resp 14 05/09/2018  9:06 AM  SpO2 97 % 05/09/2018  9:06 AM    Last Pain:  Vitals:   05/09/18 0900  TempSrc: Tympanic  PainSc:          Complications: No apparent anesthesia complications

## 2018-05-09 NOTE — Op Note (Signed)
[facility] Patient Name: Amanda Cruz Procedure Date: 05/09/2018 8:45 AM MRN: [id number] Account #: [id number] Date of Birth: Oct 27, 1945 Admit Type: Outpatient Age: 72 Room: [facility] ENDO ROOM 4 Gender: Female Note Status: Finalized Procedure:            Colonoscopy Indications:          Screening for colorectal malignant neoplasm Providers:            [name] MD, MD Referring MD:         [name], MD (Referring MD) Medicines:            Propofol per Anesthesia Complications:        No immediate complications. Procedure:            Pre-Anesthesia Assessment:                       - Prior to the procedure, a History and Physical was                        performed, and patient medications and allergies were                        reviewed. The patient's tolerance of previous                        anesthesia was also reviewed. The risks and benefits of                        the procedure and the sedation options and risks were                        discussed with the patient. All questions were                        answered, and informed consent was obtained. Prior                        Anticoagulants: The patient has taken no previous                        anticoagulant or antiplatelet agents. ASA Grade                        Assessment: II - A patient with mild systemic disease.                        After reviewing the risks and benefits, the patient was                        deemed in satisfactory condition to undergo the                        procedure.                       After obtaining informed consent, the colonoscope was                        passed under direct vision. Throughout the procedure,  the patient's blood pressure, pulse, and oxygen                        saturations were monitored continuously. The                        Colonoscope was introduced through the anus and        advanced to the the cecum, identified by appendiceal                        orifice and ileocecal valve. The colonoscopy was                        performed without difficulty. The patient tolerated the                        procedure well. The quality of the bowel preparation                        was excellent. Findings:      The perianal and digital rectal examinations were normal.      Two sessile polyps were found in the sigmoid colon. The polyps were 2 to       3 mm in size. These polyps were removed with a cold biopsy forceps.       Resection and retrieval were complete.      Multiple small-mouthed diverticula were found in the sigmoid colon.      Non-bleeding internal hemorrhoids were found during retroflexion. The       hemorrhoids were Grade I (internal hemorrhoids that do not prolapse). Impression:           - Two 2 to 3 mm polyps in the sigmoid colon, removed                        with a cold biopsy forceps. Resected and retrieved.                       - Diverticulosis in the sigmoid colon.                       - Non-bleeding internal hemorrhoids. Recommendation:       - Discharge patient to home.                       - Resume previous diet.                       - Continue present medications.                       - Await pathology results.                       - Repeat colonoscopy in 5 years if polyp adenoma and 10                        years if hyperplastic Procedure Code(s):    --- Professional ---                       [phone], Colonoscopy, flexible; with biopsy, single or  multiple Diagnosis Code(s):    --- Professional ---                       Z12.11, Encounter for screening for malignant neoplasm                        of colon                       D12.5, Benign neoplasm of sigmoid colon CPT copyright 2017 [employer]. All rights reserved. The codes documented in this report are preliminary and upon coder review  may  be revised to meet current compliance requirements. [name] MD, MD 05/09/2018 9:03:36 AM This report has been signed electronically. Number of Addenda: 0 Note Initiated On: 05/09/2018 8:45 AM Scope Withdrawal Time: 0 hours 6 minutes 55 seconds  Total Procedure Duration: 0 hours 11 minutes 29 seconds       [facility]

## 2018-05-10 ENCOUNTER — Encounter: Payer: Self-pay | Admitting: Gastroenterology

## 2018-05-10 LAB — SURGICAL PATHOLOGY

## 2018-05-10 NOTE — Anesthesia Postprocedure Evaluation (Signed)
Anesthesia Post Note  Patient: Amanda Cruz  Procedure(s) Performed: COLONOSCOPY WITH PROPOFOL (N/A )  Patient location during evaluation: PACU Anesthesia Type: General Level of consciousness: awake and alert and oriented Pain management: pain level controlled Vital Signs Assessment: post-procedure vital signs reviewed and stable Respiratory status: spontaneous breathing Cardiovascular status: blood pressure returned to baseline Anesthetic complications: no     Last Vitals:  Vitals:   05/09/18 0920 05/09/18 0930  BP: 135/82 (!) 171/74  Pulse: 72 72  Resp: 17 18  Temp:    SpO2: 99% 99%    Last Pain:  Vitals:   05/10/18 0749  TempSrc:   PainSc: 0-No pain                 Darshana Curnutt

## 2018-05-11 ENCOUNTER — Encounter: Payer: Self-pay | Admitting: Gastroenterology

## 2018-05-26 DIAGNOSIS — I83899 Varicose veins of unspecified lower extremities with other complications: Secondary | ICD-10-CM | POA: Diagnosis not present

## 2018-05-26 DIAGNOSIS — J028 Acute pharyngitis due to other specified organisms: Secondary | ICD-10-CM | POA: Diagnosis not present

## 2018-05-26 DIAGNOSIS — E669 Obesity, unspecified: Secondary | ICD-10-CM | POA: Diagnosis not present

## 2018-05-26 DIAGNOSIS — J209 Acute bronchitis, unspecified: Secondary | ICD-10-CM | POA: Diagnosis not present

## 2018-06-05 DIAGNOSIS — J209 Acute bronchitis, unspecified: Secondary | ICD-10-CM | POA: Diagnosis not present

## 2018-06-05 DIAGNOSIS — I83899 Varicose veins of unspecified lower extremities with other complications: Secondary | ICD-10-CM | POA: Diagnosis not present

## 2018-06-05 DIAGNOSIS — I10 Essential (primary) hypertension: Secondary | ICD-10-CM | POA: Diagnosis not present

## 2018-06-05 DIAGNOSIS — E669 Obesity, unspecified: Secondary | ICD-10-CM | POA: Diagnosis not present

## 2018-06-12 DIAGNOSIS — J40 Bronchitis, not specified as acute or chronic: Secondary | ICD-10-CM | POA: Diagnosis not present

## 2018-07-24 DIAGNOSIS — I8310 Varicose veins of unspecified lower extremity with inflammation: Secondary | ICD-10-CM | POA: Diagnosis not present

## 2018-07-24 DIAGNOSIS — L720 Epidermal cyst: Secondary | ICD-10-CM | POA: Diagnosis not present

## 2018-08-07 ENCOUNTER — Encounter (INDEPENDENT_AMBULATORY_CARE_PROVIDER_SITE_OTHER): Payer: Self-pay | Admitting: Vascular Surgery

## 2018-08-07 ENCOUNTER — Ambulatory Visit (INDEPENDENT_AMBULATORY_CARE_PROVIDER_SITE_OTHER): Payer: PPO | Admitting: Vascular Surgery

## 2018-08-07 VITALS — BP 175/75 | HR 76 | Ht 63.0 in | Wt 168.0 lb

## 2018-08-07 DIAGNOSIS — E782 Mixed hyperlipidemia: Secondary | ICD-10-CM | POA: Diagnosis not present

## 2018-08-07 DIAGNOSIS — I872 Venous insufficiency (chronic) (peripheral): Secondary | ICD-10-CM | POA: Diagnosis not present

## 2018-08-07 DIAGNOSIS — K219 Gastro-esophageal reflux disease without esophagitis: Secondary | ICD-10-CM

## 2018-08-07 DIAGNOSIS — E785 Hyperlipidemia, unspecified: Secondary | ICD-10-CM | POA: Insufficient documentation

## 2018-08-07 DIAGNOSIS — I10 Essential (primary) hypertension: Secondary | ICD-10-CM | POA: Diagnosis not present

## 2018-08-07 NOTE — Progress Notes (Signed)
MRN : [id number]  Amanda Cruz is a 72 y.o. (1946-02-14) female who presents with chief complaint of  Chief Complaint  Patient presents with  . Advice Only    discoloration of bilateral LE  .  History of Present Illness:   Patient is seen for evaluation of leg pain and leg swelling. The patient first noticed the swelling remotely. The swelling is associated with pain and discoloration. The pain and swelling worsens with prolonged dependency and improves with elevation. The pain is unrelated to activity.  The patient notes that in the morning the legs are significantly improved but they steadily worsened throughout the course of the day. The patient also notes a steady worsening of the discoloration in the ankle and shin area.   The patient denies claudication symptoms.  The patient denies symptoms consistent with rest pain.  The patient denies and extensive history of DJD and LS spine disease.  The patient has no had any past angiography, interventions or vascular surgery.  Elevation makes the leg symptoms better, dependency makes them much worse. There is no history of ulcerations. The patient denies any recent changes in medications.  The patient has not been wearing graduated compression.  The patient denies a history of DVT or PE. There is no prior history of phlebitis. There is no history of primary lymphedema.  No history of malignancies. No history of trauma or groin or pelvic surgery. There is no history of radiation treatment to the groin or pelvis  The patient denies amaurosis fugax or recent TIA symptoms. There are no recent neurological changes noted. The patient denies recent episodes of angina or shortness of breath  Current Meds  Medication Sig  . albuterol (PROVENTIL HFA;VENTOLIN HFA) 108 (90 Base) MCG/ACT inhaler Inhale into the lungs.  Marland Kitchen atorvastatin (LIPITOR) 20 MG tablet   . metoprolol (LOPRESSOR) 50 MG tablet     Past Medical History:  Diagnosis Date    . Hypertension     Past Surgical History:  Procedure Laterality Date  . COLONOSCOPY WITH PROPOFOL N/A 05/09/2018   Procedure: COLONOSCOPY WITH PROPOFOL;  Surgeon: [name], MD;  Location: [facility] ENDOSCOPY;  Service: Endoscopy;  Laterality: N/A;  . TEAR DUCT PROBING  03/10/2015  . TUBAL LIGATION      Social History Social History   Tobacco Use  . Smoking status: Never Smoker  . Smokeless tobacco: Never Used  Substance Use Topics  . Alcohol use: Yes    Alcohol/week: 1.0 standard drinks    Types: 1 Glasses of wine per week  . Drug use: No    Family History Family History  Problem Relation Age of Onset  . Stroke Mother   . Heart failure Mother   . Thyroid disease Paternal Grandfather   No family history of bleeding/clotting disorders, porphyria or autoimmune disease   No Known Allergies   REVIEW OF SYSTEMS (Negative unless checked)  Constitutional: [] Weight loss  [] Fever  [] Chills Cardiac: [] Chest pain   [] Chest pressure   [] Palpitations   [] Shortness of breath when laying flat   [] Shortness of breath with exertion. Vascular:  [x] Pain in legs with walking   [x] Pain in legs with dependency  [] History of DVT   [] Phlebitis   [x] Swelling in legs   [] Varicose veins   [] Non-healing ulcers Pulmonary:   [] Uses home oxygen   [] Productive cough   [] Hemoptysis   [] Wheeze  [] COPD   [] Asthma Neurologic:  [] Dizziness   [] Seizures   [] History of stroke   [] History  of TIA  [] Aphasia   [] Vissual changes   [] Weakness or numbness in arm   [] Weakness or numbness in leg Musculoskeletal:   [] Joint swelling   [] Joint pain   [] Low back pain Hematologic:  [] Easy bruising  [] Easy bleeding   [] Hypercoagulable state   [] Anemic Gastrointestinal:  [] Diarrhea   [] Vomiting  [] Gastroesophageal reflux/heartburn   [] Difficulty swallowing. Genitourinary:  [] Chronic kidney disease   [] Difficult urination  [] Frequent urination   [] Blood in urine Skin:  [x] Rashes   [] Ulcers  Psychological:  [] History of  anxiety   []  History of major depression.  Physical Examination  Vitals:   08/07/18 0903  BP: (!) 175/75  Pulse: 76  Weight: 168 lb (76.2 kg)  Height: 5\' 3"  (1.6 m)   Body mass index is 29.76 kg/m. Gen: WD/WN, NAD Head: [place]/AT, No temporalis wasting.  Ear/Nose/Throat: Hearing grossly intact, nares w/o erythema or drainage, poor dentition Eyes: PER, EOMI, sclera nonicteric.  Neck: Supple, no masses.  No bruit or JVD.  Pulmonary:  Good air movement, clear to auscultation bilaterally, no use of accessory muscles.  Cardiac: RRR, normal S1, S2, no Murmurs. Vascular: scattered varicosities present bilaterally.  Moderate to severe venous stasis changes to the legs bilaterally right > left.  2-3+ soft pitting edema. Vessel Right Left  Radial Palpable Palpable  PT Palpable Palpable  DP Palpable Palpable  Gastrointestinal: soft, non-distended. No guarding/no peritoneal signs.  Musculoskeletal: M/S 5/5 throughout.  No deformity or atrophy.  Neurologic: CN 2-12 intact. Pain and light touch intact in extremities.  Symmetrical.  Speech is fluent. Motor exam as listed above. Psychiatric: Judgment intact, Mood & affect appropriate for pt's clinical situation. Dermatologic: No rashes or ulcers noted.  No changes consistent with cellulitis. Lymph : No Cervical lymphadenopathy, no lichenification or skin changes of chronic lymphedema.  CBC Lab Results  Component Value Date   WBC 4.0 10/16/2015   HGB 13.0 10/16/2015   HCT 37.6 10/16/2015   MCV 87 10/16/2015   PLT 189 10/16/2015    BMET No results found for: NA, K, CL, CO2, GLUCOSE, BUN, CREATININE, CALCIUM, GFRNONAA, GFRAA CrCl cannot be calculated (No successful lab value found.).  COAG No results found for: INR, PROTIME  Radiology No results found.   Assessment/Plan 1. Chronic venous insufficiency No surgery or intervention at this point in time.    I have had a long discussion with the patient regarding venous insufficiency and  why it  causes symptoms. I have discussed with the patient the chronic skin changes that accompany venous insufficiency and the long term sequela such as infection and ulceration.  Patient will begin wearing graduated compression stockings class 1 (20-30 mmHg) or compression wraps on a daily basis a prescription was given. The patient will put the stockings on first thing in the morning and removing them in the evening. The patient is instructed specifically not to sleep in the stockings.    In addition, behavioral modification including several periods of elevation of the lower extremities during the day will be continued. I have demonstrated that proper elevation is a position with the ankles at heart level.  The patient is instructed to begin routine exercise, especially walking on a daily basis  Patient should undergo duplex ultrasound of the venous system to ensure that DVT or reflux is not present.  Following the review of the ultrasound the patient will follow up in 2-3 months to reassess the degree of swelling and the control that graduated compression stockings or compression wraps  is offering.   The patient can be assessed for a Lymph Pump at that time - VAS [place] LOWER EXTREMITY VENOUS REFLUX; Future  2. Essential (primary) hypertension Continue antihypertensive medications as already ordered, these medications have been reviewed and there are no changes at this time.   3. Mixed hyperlipidemia Continue statin as ordered and reviewed, no changes at this time   4. Gastroesophageal reflux disease without esophagitis Continue PPI as already ordered, this medication has been reviewed and there are no changes at this time.  Avoidence of caffeine and alcohol  Moderate elevation of the head of the bed      Hortencia Pilar, MD  08/07/2018 9:19 AM

## 2018-08-10 DIAGNOSIS — E669 Obesity, unspecified: Secondary | ICD-10-CM | POA: Diagnosis not present

## 2018-08-10 DIAGNOSIS — J441 Chronic obstructive pulmonary disease with (acute) exacerbation: Secondary | ICD-10-CM | POA: Diagnosis not present

## 2018-08-10 DIAGNOSIS — R0602 Shortness of breath: Secondary | ICD-10-CM | POA: Diagnosis not present

## 2018-08-10 DIAGNOSIS — I10 Essential (primary) hypertension: Secondary | ICD-10-CM | POA: Diagnosis not present

## 2018-08-10 DIAGNOSIS — I83899 Varicose veins of unspecified lower extremities with other complications: Secondary | ICD-10-CM | POA: Diagnosis not present

## 2018-08-14 DIAGNOSIS — R0602 Shortness of breath: Secondary | ICD-10-CM | POA: Diagnosis not present

## 2018-08-14 DIAGNOSIS — J441 Chronic obstructive pulmonary disease with (acute) exacerbation: Secondary | ICD-10-CM | POA: Diagnosis not present

## 2018-08-14 DIAGNOSIS — I10 Essential (primary) hypertension: Secondary | ICD-10-CM | POA: Diagnosis not present

## 2018-08-14 DIAGNOSIS — I83899 Varicose veins of unspecified lower extremities with other complications: Secondary | ICD-10-CM | POA: Diagnosis not present

## 2018-08-17 ENCOUNTER — Ambulatory Visit (INDEPENDENT_AMBULATORY_CARE_PROVIDER_SITE_OTHER): Payer: PPO

## 2018-08-17 ENCOUNTER — Ambulatory Visit (INDEPENDENT_AMBULATORY_CARE_PROVIDER_SITE_OTHER): Payer: PPO | Admitting: Nurse Practitioner

## 2018-08-17 ENCOUNTER — Encounter (INDEPENDENT_AMBULATORY_CARE_PROVIDER_SITE_OTHER): Payer: Self-pay | Admitting: Nurse Practitioner

## 2018-08-17 VITALS — BP 189/77 | HR 74 | Resp 12 | Ht 63.0 in | Wt 168.4 lb

## 2018-08-17 DIAGNOSIS — E782 Mixed hyperlipidemia: Secondary | ICD-10-CM | POA: Diagnosis not present

## 2018-08-17 DIAGNOSIS — I872 Venous insufficiency (chronic) (peripheral): Secondary | ICD-10-CM

## 2018-08-17 DIAGNOSIS — I83893 Varicose veins of bilateral lower extremities with other complications: Secondary | ICD-10-CM | POA: Diagnosis not present

## 2018-08-17 DIAGNOSIS — I10 Essential (primary) hypertension: Secondary | ICD-10-CM

## 2018-08-24 ENCOUNTER — Encounter (INDEPENDENT_AMBULATORY_CARE_PROVIDER_SITE_OTHER): Payer: Self-pay | Admitting: Nurse Practitioner

## 2018-08-24 NOTE — Progress Notes (Addendum)
Subjective:    Patient ID: Amanda Cruz, female    DOB: 1946-07-13, 71 y.o.   MRN: [id number] Chief Complaint  Patient presents with  . Follow-up    HPI  Amanda Cruz is a 72 y.o. female presents today with complaints of pain, leg swelling and varicosities.  She states that the pain is more so localized around her knee area with some instances of a sharp shooting pain.  She also states that she has some burning and stinging in her lower extremities where her varicosities lie.  She has also had more swelling in her lower extremities.  She denies any ulcerations or wounds of her lower extremities.  She denies any recent trauma of her lower extremities.  The patient states that she has had bilateral endovenous laser ablations in the past.  She states that she is having some of the same pain and irritation that she has had previously.  She states that with the bilateral swelling that she has going on at this time it is been difficult to utilize medical grade 1 compression stockings.  She underwent a lower extremity venous reflux study today which revealed reflux at the level of the knee and the great saphenous vein of the right lower extremity.  Her left lower extremity revealed a Baker's cyst measuring 7.62 cm x 2.61 cm x 2.27 cm.  There is no evidence of DVT bilaterally there was no evidence of superficial venous thrombosis bilaterally.  Her right lower extremity also demonstrates that the great saphenous vein is not visualized from the mid to distal portion.  Past Medical History:  Diagnosis Date  . Hypertension     Past Surgical History:  Procedure Laterality Date  . COLONOSCOPY WITH PROPOFOL N/A 05/09/2018   Procedure: COLONOSCOPY WITH PROPOFOL;  Surgeon: [name], MD;  Location: [facility] ENDOSCOPY;  Service: Endoscopy;  Laterality: N/A;  . TEAR DUCT PROBING  03/10/2015  . TUBAL LIGATION      Social History   Socioeconomic History  . Marital status: Married    Spouse name:  Not on file  . Number of children: Not on file  . Years of education: Not on file  . Highest education level: Not on file  Occupational History  . Not on file  Social Needs  . Financial resource strain: Somewhat hard  . Food insecurity:    Worry: Never true    Inability: Never true  . Transportation needs:    Medical: No    Non-medical: No  Tobacco Use  . Smoking status: Never Smoker  . Smokeless tobacco: Never Used  Substance and Sexual Activity  . Alcohol use: Yes    Alcohol/week: 1.0 standard drinks    Types: 1 Glasses of wine per week  . Drug use: No  . Sexual activity: Not Currently    Birth control/protection: Post-menopausal  Lifestyle  . Physical activity:    Days per week: 0 days    Minutes per session: 0 min  . Stress: To some extent  Relationships  . Social connections:    Talks on phone: Patient refused    Gets together: Patient refused    Attends religious service: Patient refused    Active member of club or organization: Patient refused    Attends meetings of clubs or organizations: Patient refused    Relationship status: Patient refused  . Intimate partner violence:    Fear of current or ex partner: Patient refused    Emotionally abused: Patient refused  Physically abused: Patient refused    Forced sexual activity: Patient refused  Other Topics Concern  . Not on file  Social History Narrative  . Not on file    Family History  Problem Relation Age of Onset  . Stroke Mother   . Heart failure Mother   . Thyroid disease Paternal Grandfather     No Known Allergies   Review of Systems   Review of Systems: Negative Unless Checked Constitutional: [] Weight loss  [] Fever  [] Chills Cardiac: [] Chest pain   []  Atrial Fibrillation  [] Palpitations   [] Shortness of breath when laying flat   [] Shortness of breath with exertion. [] Shortness of breath at rest Vascular:  [] Pain in legs with walking   [x] Pain in legs with standing [] Pain in legs when laying  flat   [] Claudication    [] Pain in feet when laying flat    [] History of DVT   [] Phlebitis   [x] Swelling in legs   [x] Varicose veins   [] Non-healing ulcers Pulmonary:   [] Uses home oxygen   [] Productive cough   [] Hemoptysis   [] Wheeze  [] COPD   [] Asthma Neurologic:  [] Dizziness   [] Seizures  [] Blackouts [] History of stroke   [] History of TIA  [] Aphasia   [] Temporary Blindness   [] Weakness or numbness in arm   [] Weakness or numbness in leg Musculoskeletal:   [x] Joint swelling   [x] Joint pain   [] Low back pain  []  History of Knee Replacement [x] Arthritis [] back Surgeries  []  Spinal Stenosis    Hematologic:  [] Easy bruising  [] Easy bleeding   [] Hypercoagulable state   [] Anemic Gastrointestinal:  [] Diarrhea   [] Vomiting  [] Gastroesophageal reflux/heartburn   [] Difficulty swallowing. [] Abdominal pain Genitourinary:  [] Chronic kidney disease   [] Difficult urination  [] Anuric   [] Blood in urine [] Frequent urination  [] Burning with urination   [] Hematuria Skin:  [] Rashes   [] Ulcers [] Wounds Psychological:  [] History of anxiety   []  History of major depression  []  Memory Difficulties     Objective:   Physical Exam  BP (!) 189/77 (BP Location: Right Arm, Patient Position: Sitting)   Pulse 74   Resp 12   Ht 5\' 3"  (1.6 m)   Wt 168 lb 6.4 oz (76.4 kg)   BMI 29.83 kg/m   Gen: WD/WN, NAD Head: [place]/AT, No temporalis wasting.  Ear/Nose/Throat: Hearing grossly intact, nares w/o erythema or drainage Eyes: PER, EOMI, sclera nonicteric.  Neck: Supple, no masses.  No JVD.  Pulmonary:  Good air movement, no use of accessory muscles.  Cardiac: RRR Vascular:  Scattered spider veins and small varicosities measuring 2 to 3 mm, 2+ nonpitting edema bilaterally Vessel Right Left  Radial Palpable Palpable  Dorsalis Pedis Palpable Palpable  Posterior Tibial Palpable Palpable   Gastrointestinal: soft, non-distended. No guarding/no peritoneal signs.  Musculoskeletal: M/S 5/5 throughout.  No deformity or atrophy.    Neurologic: Pain and light touch intact in extremities.  Symmetrical.  Speech is fluent. Motor exam as listed above. Psychiatric: Judgment intact, Mood & affect appropriate for pt's clinical situation. Dermatologic:No Ulcers Noted.  No changes consistent with cellulitis. Lymph : No Cervical lymphadenopathy, no lichenification or skin changes of chronic lymphedema.      Assessment & Plan:   1. Chronic venous insufficiency She underwent a lower extremity venous reflux study today which revealed reflux at the level of the knee and the great saphenous vein of the right lower extremity.  Her left lower extremity revealed a Baker's cyst measuring 7.62 cm x 2.61 cm x 2.27 cm.  There  is no evidence of DVT bilaterally there was no evidence of superficial venous thrombosis bilaterally.  Her right lower extremity also demonstrates that the great saphenous vein is not visualized from the mid to distal portion.  Spoke with patient by using medical grade 1 pressure stockings to further control and assist with her lower extremity swelling.  We talked extensively about the different types of brands of compression stockings that may allow her a better fit so that she can wear them without pain and difficulty as she is doing currently.  The patient's husband currently has a lymphedema pump and she will try to utilize his to see how she likes it as well as to see how it works for her.  The patient has had a previous history of bilateral great saphenous vein ablations.  I do not feel that the patient would benefit from a repeat endovenous laser ablation of the reflux that is in her right lower extremity as it is a very small area, which is likely not to be as successful.  The patient will continue wearing the graduated compression stockings and using the over-the-counter pain medications to treat her symptoms.     2. Essential (primary) hypertension Continue antihypertensive medications as already ordered, these  medications have been reviewed and there are no changes at this time.   3. 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  Patient should undergo injection sclerotherapy to treat the residual varicosities.  The risks, benefits and alternative therapies were reviewed in detail with the patient.  All questions were answered.  The patient agrees to proceed with sclerotherapy at their convenience.      Current Outpatient Medications on File Prior to Visit  Medication Sig Dispense Refill  . albuterol (PROVENTIL HFA;VENTOLIN HFA) 108 (90 Base) MCG/ACT inhaler Inhale into the lungs.    Marland Kitchen atorvastatin (LIPITOR) 20 MG tablet     . metoprolol succinate (TOPROL-XL) 50 MG 24 hr tablet Take 50 mg by mouth.    Levin Erp SULFATE VAGINAL (TRIMO-SAN) 0.025 % GEL Place 1 Applicatorful vaginally 2 (two) times a week. 1 Tube 3   No current facility-administered medications on file prior to visit.     There are no Patient Instructions on file for this visit. No follow-ups on file.   Kris Hartmann, NP  This note was completed with Sales executive.  Any errors are purely unintentional.

## 2018-08-25 DIAGNOSIS — I83893 Varicose veins of bilateral lower extremities with other complications: Secondary | ICD-10-CM | POA: Insufficient documentation

## 2018-09-07 ENCOUNTER — Ambulatory Visit (INDEPENDENT_AMBULATORY_CARE_PROVIDER_SITE_OTHER): Payer: PPO | Admitting: Nurse Practitioner

## 2018-09-07 ENCOUNTER — Encounter (INDEPENDENT_AMBULATORY_CARE_PROVIDER_SITE_OTHER): Payer: Self-pay | Admitting: Nurse Practitioner

## 2018-09-07 VITALS — BP 147/77 | HR 92 | Resp 16 | Ht 63.0 in | Wt 165.2 lb

## 2018-09-07 DIAGNOSIS — I83812 Varicose veins of left lower extremities with pain: Secondary | ICD-10-CM | POA: Diagnosis not present

## 2018-09-07 DIAGNOSIS — I83811 Varicose veins of right lower extremities with pain: Secondary | ICD-10-CM

## 2018-09-07 DIAGNOSIS — I83893 Varicose veins of bilateral lower extremities with other complications: Secondary | ICD-10-CM

## 2018-09-12 ENCOUNTER — Encounter (INDEPENDENT_AMBULATORY_CARE_PROVIDER_SITE_OTHER): Payer: Self-pay | Admitting: Nurse Practitioner

## 2018-09-12 NOTE — Progress Notes (Signed)
Varicose veins of lef lower extremity with inflammation (454.1  I83.10) Current Plans   Indication: Patient presents with symptomatic varicose veins of the left  lower extremity.   Procedure: Sclerotherapy using hypertonic saline mixed with 1% Lidocaine was performed on the left lower extremity. Compression wraps were placed. The patient tolerated the procedure well.

## 2018-09-25 DIAGNOSIS — L720 Epidermal cyst: Secondary | ICD-10-CM | POA: Diagnosis not present

## 2018-09-28 ENCOUNTER — Encounter (INDEPENDENT_AMBULATORY_CARE_PROVIDER_SITE_OTHER): Payer: Self-pay | Admitting: Nurse Practitioner

## 2018-09-28 ENCOUNTER — Ambulatory Visit (INDEPENDENT_AMBULATORY_CARE_PROVIDER_SITE_OTHER): Payer: PPO | Admitting: Nurse Practitioner

## 2018-09-28 VITALS — BP 145/69 | HR 78 | Resp 16 | Ht 63.0 in | Wt 165.6 lb

## 2018-09-28 DIAGNOSIS — I83893 Varicose veins of bilateral lower extremities with other complications: Secondary | ICD-10-CM

## 2018-09-28 NOTE — Progress Notes (Signed)
Varicose veins of left lower extremity with inflammation (454.1  I83.10) Current Plans   Indication: Patient presents with symptomatic varicose veins of the left lower extremity.   Procedure: Sclerotherapy using hypertonic saline mixed with 1% Lidocaine was performed on the left lower extremity. Compression wraps were placed. The patient tolerated the procedure well. 

## 2018-10-12 ENCOUNTER — Encounter (INDEPENDENT_AMBULATORY_CARE_PROVIDER_SITE_OTHER): Payer: Self-pay | Admitting: Nurse Practitioner

## 2018-10-12 ENCOUNTER — Ambulatory Visit (INDEPENDENT_AMBULATORY_CARE_PROVIDER_SITE_OTHER): Payer: PPO | Admitting: Nurse Practitioner

## 2018-10-12 VITALS — BP 148/95 | HR 69 | Resp 16 | Ht 63.0 in | Wt 167.4 lb

## 2018-10-12 DIAGNOSIS — I872 Venous insufficiency (chronic) (peripheral): Secondary | ICD-10-CM | POA: Diagnosis not present

## 2018-10-23 ENCOUNTER — Encounter (INDEPENDENT_AMBULATORY_CARE_PROVIDER_SITE_OTHER): Payer: Self-pay | Admitting: Nurse Practitioner

## 2018-10-23 NOTE — Progress Notes (Signed)
Varicose veins of right  lower extremity with inflammation (454.1  I83.10) Current Plans   Indication: Patient presents with symptomatic varicose veins of the right  lower extremity.   Procedure: Sclerotherapy using hypertonic saline mixed with 1% Lidocaine was performed on the right lower extremity. Compression wraps were placed. The patient tolerated the procedure well. 

## 2018-11-02 ENCOUNTER — Ambulatory Visit (INDEPENDENT_AMBULATORY_CARE_PROVIDER_SITE_OTHER): Payer: PPO | Admitting: Nurse Practitioner

## 2018-11-02 ENCOUNTER — Other Ambulatory Visit: Payer: Self-pay

## 2018-11-02 ENCOUNTER — Encounter (INDEPENDENT_AMBULATORY_CARE_PROVIDER_SITE_OTHER): Payer: Self-pay | Admitting: Nurse Practitioner

## 2018-11-02 VITALS — BP 187/75 | HR 86 | Resp 10 | Ht 63.0 in | Wt 170.0 lb

## 2018-11-02 DIAGNOSIS — I83893 Varicose veins of bilateral lower extremities with other complications: Secondary | ICD-10-CM

## 2018-11-02 DIAGNOSIS — L99 Other disorders of skin and subcutaneous tissue in diseases classified elsewhere: Secondary | ICD-10-CM | POA: Diagnosis not present

## 2018-11-02 DIAGNOSIS — L720 Epidermal cyst: Secondary | ICD-10-CM | POA: Diagnosis not present

## 2018-11-02 DIAGNOSIS — L98499 Non-pressure chronic ulcer of skin of other sites with unspecified severity: Secondary | ICD-10-CM | POA: Diagnosis not present

## 2018-11-02 MED ORDER — DOXYCYCLINE HYCLATE 100 MG PO CAPS: 100.0000 mg | ORAL_CAPSULE | Freq: Two times a day (BID) | ORAL | 0 refills | Status: DC

## 2018-11-02 NOTE — Progress Notes (Signed)
Varicose veins of the posterior bilateral  lower extremities with inflammation (454.1  I83.10) Current Plans   Indication: Patient presents with symptomatic varicose veins of the posterior lower extremities    Procedure: Sclerotherapy using hypertonic saline mixed with 1% Lidocaine was performed on the posterior lower extremities. Compression wraps were placed. The patient tolerated the procedure well.

## 2018-12-04 ENCOUNTER — Ambulatory Visit (INDEPENDENT_AMBULATORY_CARE_PROVIDER_SITE_OTHER): Payer: PPO | Admitting: Nurse Practitioner

## 2019-02-13 DIAGNOSIS — D485 Neoplasm of uncertain behavior of skin: Secondary | ICD-10-CM | POA: Diagnosis not present

## 2019-02-13 DIAGNOSIS — L720 Epidermal cyst: Secondary | ICD-10-CM | POA: Diagnosis not present

## 2019-02-20 DIAGNOSIS — L720 Epidermal cyst: Secondary | ICD-10-CM | POA: Diagnosis not present

## 2019-03-30 ENCOUNTER — Other Ambulatory Visit: Payer: Self-pay

## 2019-06-22 DIAGNOSIS — Z20828 Contact with and (suspected) exposure to other viral communicable diseases: Secondary | ICD-10-CM | POA: Diagnosis not present

## 2019-07-03 DIAGNOSIS — R5381 Other malaise: Secondary | ICD-10-CM | POA: Diagnosis not present

## 2019-07-03 DIAGNOSIS — I10 Essential (primary) hypertension: Secondary | ICD-10-CM | POA: Diagnosis not present

## 2019-07-03 DIAGNOSIS — Z0000 Encounter for general adult medical examination without abnormal findings: Secondary | ICD-10-CM | POA: Diagnosis not present

## 2019-07-03 DIAGNOSIS — R0602 Shortness of breath: Secondary | ICD-10-CM | POA: Diagnosis not present

## 2019-07-03 DIAGNOSIS — E669 Obesity, unspecified: Secondary | ICD-10-CM | POA: Diagnosis not present

## 2019-07-03 DIAGNOSIS — E119 Type 2 diabetes mellitus without complications: Secondary | ICD-10-CM | POA: Diagnosis not present

## 2019-07-03 DIAGNOSIS — E7849 Other hyperlipidemia: Secondary | ICD-10-CM | POA: Diagnosis not present

## 2019-07-03 DIAGNOSIS — J441 Chronic obstructive pulmonary disease with (acute) exacerbation: Secondary | ICD-10-CM | POA: Diagnosis not present

## 2019-07-17 DIAGNOSIS — H35411 Lattice degeneration of retina, right eye: Secondary | ICD-10-CM | POA: Diagnosis not present

## 2019-08-03 DIAGNOSIS — H2512 Age-related nuclear cataract, left eye: Secondary | ICD-10-CM | POA: Diagnosis not present

## 2019-08-03 DIAGNOSIS — I10 Essential (primary) hypertension: Secondary | ICD-10-CM | POA: Diagnosis not present

## 2019-08-16 ENCOUNTER — Encounter: Payer: Self-pay | Admitting: Ophthalmology

## 2019-08-16 ENCOUNTER — Other Ambulatory Visit: Payer: Self-pay

## 2019-08-17 ENCOUNTER — Other Ambulatory Visit
Admission: RE | Admit: 2019-08-17 | Discharge: 2019-08-17 | Disposition: A | Discharge status: Home / Self Care | Payer: PPO | Source: Ambulatory Visit | Attending: Ophthalmology | Admitting: Ophthalmology

## 2019-08-17 ENCOUNTER — Other Ambulatory Visit: Payer: PPO

## 2019-08-17 DIAGNOSIS — Z01812 Encounter for preprocedural laboratory examination: Secondary | ICD-10-CM | POA: Insufficient documentation

## 2019-08-17 DIAGNOSIS — Z20828 Contact with and (suspected) exposure to other viral communicable diseases: Secondary | ICD-10-CM | POA: Diagnosis not present

## 2019-08-17 LAB — SARS CORONAVIRUS 2 (TAT 6-24 HRS): SARS Coronavirus 2: NEGATIVE

## 2019-08-20 NOTE — Discharge Instructions (Signed)

## 2019-08-22 ENCOUNTER — Other Ambulatory Visit: Payer: Self-pay

## 2019-08-22 ENCOUNTER — Ambulatory Visit
Admission: RE | Admit: 2019-08-22 | Discharge: 2019-08-22 | Disposition: A | Discharge status: Home / Self Care | Payer: PPO | Attending: Ophthalmology | Admitting: Ophthalmology

## 2019-08-22 ENCOUNTER — Encounter: Payer: Self-pay | Admitting: Ophthalmology

## 2019-08-22 ENCOUNTER — Encounter
Admission: RE | Disposition: A | Discharge status: Home / Self Care | Payer: Self-pay | Source: Home / Self Care | Attending: Ophthalmology

## 2019-08-22 ENCOUNTER — Ambulatory Visit: Payer: PPO | Admitting: Anesthesiology

## 2019-08-22 DIAGNOSIS — I10 Essential (primary) hypertension: Secondary | ICD-10-CM | POA: Insufficient documentation

## 2019-08-22 DIAGNOSIS — H2512 Age-related nuclear cataract, left eye: Secondary | ICD-10-CM | POA: Insufficient documentation

## 2019-08-22 DIAGNOSIS — M1990 Unspecified osteoarthritis, unspecified site: Secondary | ICD-10-CM | POA: Insufficient documentation

## 2019-08-22 DIAGNOSIS — J45909 Unspecified asthma, uncomplicated: Secondary | ICD-10-CM | POA: Insufficient documentation

## 2019-08-22 DIAGNOSIS — H25812 Combined forms of age-related cataract, left eye: Secondary | ICD-10-CM | POA: Diagnosis not present

## 2019-08-22 HISTORY — DX: Gastro-esophageal reflux disease without esophagitis: K21.9

## 2019-08-22 HISTORY — PX: CATARACT EXTRACTION W/PHACO: SHX586

## 2019-08-22 HISTORY — DX: Presence of dental prosthetic device (complete) (partial): Z97.2

## 2019-08-22 SURGERY — PHACOEMULSIFICATION, CATARACT, WITH IOL INSERTION
Anesthesia: Monitor Anesthesia Care | Site: Eye | Laterality: Left

## 2019-08-22 MED ORDER — LIDOCAINE HCL (PF) 2 % IJ SOLN
INTRAOCULAR | Status: DC | PRN
  Administered 2019-08-22: 1 mL

## 2019-08-22 MED ORDER — EPINEPHRINE PF 1 MG/ML IJ SOLN
INTRAOCULAR | Status: DC | PRN
  Administered 2019-08-22: 54 mL via OPHTHALMIC

## 2019-08-22 MED ORDER — TETRACAINE HCL 0.5 % OP SOLN
1.0000 [drp] | OPHTHALMIC | Status: DC | PRN
  Administered 2019-08-22 (×3): 1 [drp] via OPHTHALMIC

## 2019-08-22 MED ORDER — BRIMONIDINE TARTRATE-TIMOLOL 0.2-0.5 % OP SOLN
OPHTHALMIC | Status: DC | PRN
  Administered 2019-08-22: 1 [drp] via OPHTHALMIC

## 2019-08-22 MED ORDER — CEFUROXIME OPHTHALMIC INJECTION 1 MG/0.1 ML
INJECTION | OPHTHALMIC | Status: DC | PRN
  Administered 2019-08-22: 0.1 mL via INTRACAMERAL

## 2019-08-22 MED ORDER — MIDAZOLAM HCL 2 MG/2ML IJ SOLN
INTRAMUSCULAR | Status: DC | PRN
  Administered 2019-08-22 (×2): 1 mg via INTRAVENOUS

## 2019-08-22 MED ORDER — ACETAMINOPHEN 160 MG/5ML PO SOLN: 325.0000 mg | Freq: Once | ORAL | Status: DC

## 2019-08-22 MED ORDER — FENTANYL CITRATE (PF) 100 MCG/2ML IJ SOLN
INTRAMUSCULAR | Status: DC | PRN
  Administered 2019-08-22 (×2): 25 ug via INTRAVENOUS
  Administered 2019-08-22: 50 ug via INTRAVENOUS

## 2019-08-22 MED ORDER — MOXIFLOXACIN HCL 0.5 % OP SOLN
1.0000 [drp] | OPHTHALMIC | Status: DC | PRN
  Administered 2019-08-22 (×3): 1 [drp] via OPHTHALMIC

## 2019-08-22 MED ORDER — LACTATED RINGERS IV SOLN: 10.0000 mL/h | INTRAVENOUS | Status: DC

## 2019-08-22 MED ORDER — NA HYALUR & NA CHOND-NA HYALUR 0.4-0.35 ML IO KIT
PACK | INTRAOCULAR | Status: DC | PRN
  Administered 2019-08-22: 1 mL via INTRAOCULAR

## 2019-08-22 MED ORDER — ACETAMINOPHEN 325 MG PO TABS: 325.0000 mg | ORAL_TABLET | Freq: Once | ORAL | Status: DC

## 2019-08-22 MED ORDER — ARMC OPHTHALMIC DILATING DROPS
1.0000 "application " | OPHTHALMIC | Status: DC | PRN
  Administered 2019-08-22 (×3): 1 via OPHTHALMIC

## 2019-08-22 MED ORDER — LABETALOL HCL 5 MG/ML IV SOLN
INTRAVENOUS | Status: DC | PRN
  Administered 2019-08-22: 5 mg via INTRAVENOUS

## 2019-08-22 SURGICAL SUPPLY — 18 items
CANNULA ANT/CHMB 27G (MISCELLANEOUS) ×1 IMPLANT
CANNULA ANT/CHMB 27GA (MISCELLANEOUS) ×2 IMPLANT
GLOVE SURG LX 7.5 STRW (GLOVE) ×1
GLOVE SURG LX STRL 7.5 STRW (GLOVE) ×1 IMPLANT
GLOVE SURG TRIUMPH 8.0 PF LTX (GLOVE) ×2 IMPLANT
GOWN STRL REUS W/ TWL LRG LVL3 (GOWN DISPOSABLE) ×2 IMPLANT
GOWN STRL REUS W/TWL LRG LVL3 (GOWN DISPOSABLE) ×2
LENS IOL IQ PAN TRC 30 20.0 IMPLANT
LENS IOL PANOP TORIC 30 20.0 ×1 IMPLANT
LENS IOL PANOPTIX TORIC 20.0 ×1 IMPLANT
MARKER SKIN DUAL TIP RULER LAB (MISCELLANEOUS) ×2 IMPLANT
PACK CATARACT BRASINGTON (MISCELLANEOUS) ×2 IMPLANT
PACK EYE AFTER SURG (MISCELLANEOUS) ×2 IMPLANT
PACK OPTHALMIC (MISCELLANEOUS) ×2 IMPLANT
SYR 3ML LL SCALE MARK (SYRINGE) ×2 IMPLANT
SYR TB 1ML LUER SLIP (SYRINGE) ×2 IMPLANT
WATER STERILE IRR 500ML POUR (IV SOLUTION) ×2 IMPLANT
WIPE NON LINTING 3.25X3.25 (MISCELLANEOUS) ×2 IMPLANT

## 2019-08-22 NOTE — Anesthesia Procedure Notes (Signed)
Procedure Name: MAC Date/Time: 08/22/2019 9:25 AM Performed by: Georga Bora, CRNA Pre-anesthesia Checklist: Patient identified, Emergency Drugs available, Suction available, Patient being monitored and Timeout performed Patient Re-evaluated:Patient Re-evaluated prior to induction Oxygen Delivery Method: Nasal cannula

## 2019-08-22 NOTE — Anesthesia Postprocedure Evaluation (Signed)
Anesthesia Post Note  Patient: Amanda Cruz  Procedure(s) Performed: CATARACT EXTRACTION PHACO AND INTRAOCULAR LENS PLACEMENT (IOC) LEFT  9.98  01:10.4  14.2% (Left Eye)     Patient location during evaluation: PACU Anesthesia Type: MAC Level of consciousness: awake and alert and oriented Pain management: satisfactory to patient Vital Signs Assessment: post-procedure vital signs reviewed and stable Respiratory status: spontaneous breathing, nonlabored ventilation and respiratory function stable Cardiovascular status: blood pressure returned to baseline and stable Postop Assessment: Adequate PO intake and No signs of nausea or vomiting Anesthetic complications: no    Raliegh Ip

## 2019-08-22 NOTE — Anesthesia Preprocedure Evaluation (Signed)
Anesthesia Evaluation  Patient identified by MRN, date of birth, ID band Patient awake    Reviewed: Allergy & Precautions, H&P , NPO status , Patient's Chart, lab work & pertinent test results  Airway Mallampati: II  TM Distance: >3 FB Neck ROM: full    Dental  (+) Upper Dentures, Lower Dentures   Pulmonary    Pulmonary exam normal breath sounds clear to auscultation       Cardiovascular hypertension, Normal cardiovascular exam Rhythm:regular Rate:Normal     Neuro/Psych    GI/Hepatic GERD  ,  Endo/Other    Renal/GU      Musculoskeletal   Abdominal   Peds  Hematology   Anesthesia Other Findings   Reproductive/Obstetrics                             Anesthesia Physical Anesthesia Plan  ASA: II  Anesthesia Plan: MAC   Post-op Pain Management:    Induction:   PONV Risk Score and Plan: 2 and TIVA, Midazolam and Treatment may vary due to age or medical condition  Airway Management Planned:   Additional Equipment:   Intra-op Plan:   Post-operative Plan:   Informed Consent: I have reviewed the patients History and Physical, chart, labs and discussed the procedure including the risks, benefits and alternatives for the proposed anesthesia with the patient or authorized representative who has indicated his/her understanding and acceptance.       Plan Discussed with: CRNA  Anesthesia Plan Comments:         Anesthesia Quick Evaluation

## 2019-08-22 NOTE — Op Note (Signed)
LOCATION:  [place]   PREOPERATIVE DIAGNOSIS:  Nuclear sclerotic cataract of the left eye.  H25.12  POSTOPERATIVE DIAGNOSIS:  Nuclear sclerotic cataract of the left eye.   PROCEDURE:  Phacoemulsification with Toric posterior chamber intraocular lens placement of the left eye.  Ultrasound time: Procedure(s): CATARACT EXTRACTION PHACO AND INTRAOCULAR LENS PLACEMENT (IOC) LEFT  9.98  01:10.4  14.2% (Left) LENS:[id number] 20.0 D Panoptix Toric Toric intraocular lens with 1.5 diopters of cylindrical power with axis orientation at 9 degrees.    SURGEON:  Wyonia Hough, MD   ANESTHESIA:  Topical with tetracaine drops and 2% Xylocaine jelly, augmented with 1% preservative-free intracameral lidocaine.  COMPLICATIONS:  None.   DESCRIPTION OF PROCEDURE:  The patient was identified in the holding room and transported to the operating suite and placed in the supine position under the operating microscope.  The left eye was identified as the operative eye, and it was prepped and draped in the usual sterile ophthalmic fashion.    A clear-corneal paracentesis incision was made at the 1:30 position.  0.5 ml of preservative-free 1% lidocaine was injected into the anterior chamber. The anterior chamber was filled with Viscoat.  A 2.4 millimeter near clear corneal incision was then made at the 10:30 position.  A cystotome and capsulorrhexis forceps were then used to make a curvilinear capsulorrhexis.  Hydrodissection and hydrodelineation were then performed using balanced salt solution.   Phacoemulsification was then used in stop and chop fashion to remove the lens, nucleus and epinucleus.  The remaining cortex was aspirated using the irrigation and aspiration handpiece.  Provisc viscoelastic was then placed into the capsular bag to distend it for lens placement.  The Verion digital marker was used to align the implant at the intended axis.   A 20.0 diopter lens was then injected into the  capsular bag.  It was rotated clockwise until the axis marks on the lens were approximately 15 degrees in the counterclockwise direction to the intended alignment.  The viscoelastic was aspirated from the eye using the irrigation aspiration handpiece.  Then, a Koch spatula through the sideport incision was used to rotate the lens in a clockwise direction until the axis markings of the intraocular lens were lined up with the Verion alignment.  Balanced salt solution was then used to hydrate the wounds. Cefuroxime 0.1 ml of a 10mg /ml solution was injected into the anterior chamber for a dose of 1 mg of intracameral antibiotic at the completion of the case.    The eye was noted to have a physiologic pressure and there was no wound leak noted.   Timolol and Brimonidine drops were applied to the eye.  The patient was taken to the recovery room in stable condition having had no complications of anesthesia or surgery.  Deisi Salonga 08/22/2019, 9:52 AM

## 2019-08-22 NOTE — H&P (Signed)

## 2019-08-22 NOTE — Transfer of Care (Signed)
Immediate Anesthesia Transfer of Care Note  Patient: Amanda Cruz  Procedure(s) Performed: CATARACT EXTRACTION PHACO AND INTRAOCULAR LENS PLACEMENT (IOC) LEFT  9.98  01:10.4  14.2% (Left Eye)  Patient Location: PACU  Anesthesia Type: MAC  Level of Consciousness: awake, alert  and patient cooperative  Airway and Oxygen Therapy: Patient Spontanous Breathing and Patient connected to supplemental oxygen  Post-op Assessment: Post-op Vital signs reviewed, Patient's Cardiovascular Status Stable, Respiratory Function Stable, Patent Airway and No signs of Nausea or vomiting  Post-op Vital Signs: Reviewed and stable  Complications: No apparent anesthesia complications

## 2019-08-28 DIAGNOSIS — J45909 Unspecified asthma, uncomplicated: Secondary | ICD-10-CM | POA: Diagnosis not present

## 2019-08-28 DIAGNOSIS — H2511 Age-related nuclear cataract, right eye: Secondary | ICD-10-CM | POA: Diagnosis not present

## 2019-08-30 ENCOUNTER — Institutional Professional Consult (permissible substitution): Payer: PPO | Admitting: Pulmonary Disease

## 2019-08-31 DIAGNOSIS — U071 COVID-19: Secondary | ICD-10-CM

## 2019-08-31 HISTORY — DX: COVID-19: U07.1

## 2019-09-11 ENCOUNTER — Other Ambulatory Visit: Payer: Self-pay

## 2019-09-11 ENCOUNTER — Encounter: Payer: Self-pay | Admitting: Ophthalmology

## 2019-09-17 ENCOUNTER — Other Ambulatory Visit
Admission: RE | Admit: 2019-09-17 | Discharge: 2019-09-17 | Disposition: A | Discharge status: Home / Self Care | Payer: PPO | Source: Ambulatory Visit | Attending: Ophthalmology | Admitting: Ophthalmology

## 2019-09-17 DIAGNOSIS — Z20822 Contact with and (suspected) exposure to covid-19: Secondary | ICD-10-CM | POA: Diagnosis not present

## 2019-09-17 DIAGNOSIS — Z01812 Encounter for preprocedural laboratory examination: Secondary | ICD-10-CM | POA: Diagnosis not present

## 2019-09-17 LAB — SARS CORONAVIRUS 2 (TAT 6-24 HRS): SARS Coronavirus 2: NEGATIVE

## 2019-09-18 NOTE — Discharge Instructions (Signed)

## 2019-09-19 ENCOUNTER — Ambulatory Visit
Admission: RE | Admit: 2019-09-19 | Discharge: 2019-09-19 | Disposition: A | Discharge status: Home / Self Care | Payer: PPO | Attending: Ophthalmology | Admitting: Ophthalmology

## 2019-09-19 ENCOUNTER — Ambulatory Visit: Payer: PPO | Admitting: Anesthesiology

## 2019-09-19 ENCOUNTER — Other Ambulatory Visit: Payer: Self-pay

## 2019-09-19 ENCOUNTER — Encounter
Admission: RE | Disposition: A | Discharge status: Home / Self Care | Payer: Self-pay | Source: Home / Self Care | Attending: Ophthalmology

## 2019-09-19 ENCOUNTER — Encounter: Payer: Self-pay | Admitting: Ophthalmology

## 2019-09-19 DIAGNOSIS — H2511 Age-related nuclear cataract, right eye: Secondary | ICD-10-CM | POA: Diagnosis not present

## 2019-09-19 DIAGNOSIS — M1990 Unspecified osteoarthritis, unspecified site: Secondary | ICD-10-CM | POA: Diagnosis not present

## 2019-09-19 DIAGNOSIS — Z79899 Other long term (current) drug therapy: Secondary | ICD-10-CM | POA: Insufficient documentation

## 2019-09-19 DIAGNOSIS — J45909 Unspecified asthma, uncomplicated: Secondary | ICD-10-CM | POA: Insufficient documentation

## 2019-09-19 DIAGNOSIS — I10 Essential (primary) hypertension: Secondary | ICD-10-CM | POA: Insufficient documentation

## 2019-09-19 DIAGNOSIS — Z7982 Long term (current) use of aspirin: Secondary | ICD-10-CM | POA: Diagnosis not present

## 2019-09-19 DIAGNOSIS — E7800 Pure hypercholesterolemia, unspecified: Secondary | ICD-10-CM | POA: Insufficient documentation

## 2019-09-19 DIAGNOSIS — H25811 Combined forms of age-related cataract, right eye: Secondary | ICD-10-CM | POA: Diagnosis not present

## 2019-09-19 DIAGNOSIS — K219 Gastro-esophageal reflux disease without esophagitis: Secondary | ICD-10-CM | POA: Insufficient documentation

## 2019-09-19 HISTORY — PX: CATARACT EXTRACTION W/PHACO: SHX586

## 2019-09-19 SURGERY — PHACOEMULSIFICATION, CATARACT, WITH IOL INSERTION
Anesthesia: Monitor Anesthesia Care | Site: Eye | Laterality: Right

## 2019-09-19 MED ORDER — FENTANYL CITRATE (PF) 100 MCG/2ML IJ SOLN
INTRAMUSCULAR | Status: DC | PRN
  Administered 2019-09-19 (×2): 25 ug via INTRAVENOUS
  Administered 2019-09-19: 50 ug via INTRAVENOUS

## 2019-09-19 MED ORDER — LACTATED RINGERS IV SOLN: 100.0000 mL/h | INTRAVENOUS | Status: DC

## 2019-09-19 MED ORDER — NA HYALUR & NA CHOND-NA HYALUR 0.4-0.35 ML IO KIT
PACK | INTRAOCULAR | Status: DC | PRN
  Administered 2019-09-19: 1 mL via INTRAOCULAR

## 2019-09-19 MED ORDER — MIDAZOLAM HCL 2 MG/2ML IJ SOLN
INTRAMUSCULAR | Status: DC | PRN
  Administered 2019-09-19 (×2): 1 mg via INTRAVENOUS

## 2019-09-19 MED ORDER — ARMC OPHTHALMIC DILATING DROPS
1.0000 "application " | OPHTHALMIC | Status: DC | PRN
  Administered 2019-09-19 (×3): 1 via OPHTHALMIC

## 2019-09-19 MED ORDER — BRIMONIDINE TARTRATE-TIMOLOL 0.2-0.5 % OP SOLN
OPHTHALMIC | Status: DC | PRN
  Administered 2019-09-19: 1 [drp] via OPHTHALMIC

## 2019-09-19 MED ORDER — MOXIFLOXACIN HCL 0.5 % OP SOLN
1.0000 [drp] | OPHTHALMIC | Status: DC | PRN
  Administered 2019-09-19 (×3): 1 [drp] via OPHTHALMIC

## 2019-09-19 MED ORDER — CEFUROXIME OPHTHALMIC INJECTION 1 MG/0.1 ML
INJECTION | OPHTHALMIC | Status: DC | PRN
  Administered 2019-09-19: 0.1 mL via INTRACAMERAL

## 2019-09-19 MED ORDER — TETRACAINE HCL 0.5 % OP SOLN
1.0000 [drp] | OPHTHALMIC | Status: DC | PRN
  Administered 2019-09-19 (×3): 1 [drp] via OPHTHALMIC

## 2019-09-19 MED ORDER — LACTATED RINGERS IV SOLN: INTRAVENOUS | Status: DC

## 2019-09-19 MED ORDER — EPINEPHRINE PF 1 MG/ML IJ SOLN
INTRAOCULAR | Status: DC | PRN
  Administered 2019-09-19: 62 mL via OPHTHALMIC

## 2019-09-19 MED ORDER — LIDOCAINE HCL (PF) 2 % IJ SOLN
INTRAOCULAR | Status: DC | PRN
  Administered 2019-09-19: 1 mL

## 2019-09-19 SURGICAL SUPPLY — 17 items
CANNULA ANT/CHMB 27G (MISCELLANEOUS) ×1 IMPLANT
CANNULA ANT/CHMB 27GA (MISCELLANEOUS) ×2 IMPLANT
GLOVE SURG LX 7.5 STRW (GLOVE) ×1
GLOVE SURG LX STRL 7.5 STRW (GLOVE) ×1 IMPLANT
GLOVE SURG TRIUMPH 8.0 PF LTX (GLOVE) ×2 IMPLANT
GOWN STRL REUS W/ TWL LRG LVL3 (GOWN DISPOSABLE) ×2 IMPLANT
GOWN STRL REUS W/TWL LRG LVL3 (GOWN DISPOSABLE) ×2
LENS IOL ACRSF IQ PAN 19.5 IMPLANT
LENS IOL IQ PANOPTIX 19.5 ×2 IMPLANT
MARKER SKIN DUAL TIP RULER LAB (MISCELLANEOUS) ×2 IMPLANT
PACK CATARACT BRASINGTON (MISCELLANEOUS) ×2 IMPLANT
PACK EYE AFTER SURG (MISCELLANEOUS) ×2 IMPLANT
PACK OPTHALMIC (MISCELLANEOUS) ×2 IMPLANT
SYR 3ML LL SCALE MARK (SYRINGE) ×2 IMPLANT
SYR TB 1ML LUER SLIP (SYRINGE) ×2 IMPLANT
WATER STERILE IRR 500ML POUR (IV SOLUTION) ×2 IMPLANT
WIPE NON LINTING 3.25X3.25 (MISCELLANEOUS) ×2 IMPLANT

## 2019-09-19 NOTE — Anesthesia Procedure Notes (Signed)
Procedure Name: MAC Performed by: Dashana Guizar, CRNA Pre-anesthesia Checklist: Patient identified, Emergency Drugs available, Suction available, Timeout performed and Patient being monitored Patient Re-evaluated:Patient Re-evaluated prior to induction Oxygen Delivery Method: Nasal cannula Placement Confirmation: positive ETCO2       

## 2019-09-19 NOTE — H&P (Signed)

## 2019-09-19 NOTE — Op Note (Signed)
D OCATION:  [place]   PREOPERATIVE DIAGNOSIS:    Nuclear sclerotic cataract right eye. H25.11   POSTOPERATIVE DIAGNOSIS:  Nuclear sclerotic cataract right eye.     PROCEDURE:  Phacoemusification with posterior chamber intraocular lens placement of the right eye   ULTRASOUND TIME: Procedure(s): CATARACT EXTRACTION PHACO AND INTRAOCULAR LENS PLACEMENT (IOC) RIGHT PANOPTIX LENS 7.44  00:52.4  14.3% (Right)  LENS:   Implant Name Type Inv. Item Serial No. Manufacturer Lot No. LRB No. Used Action  ACRYSOF IQ PANOPTIX IOL Intraocular Lens  [id number]   Right 1 Implanted    [id number] 19.5 D Panoptix IOL     SURGEON:  Wyonia Hough, MD   ANESTHESIA:  Topical with tetracaine drops and 2% Xylocaine jelly, augmented with 1% preservative-free intracameral lidocaine.    COMPLICATIONS:  None.   DESCRIPTION OF PROCEDURE:  The patient was identified in the holding room and transported to the operating room and placed in the supine position under the operating microscope.  The right eye was identified as the operative eye and it was prepped and draped in the usual sterile ophthalmic fashion.   A 1 millimeter clear-corneal paracentesis was made at the 12:00 position.  0.5 ml of preservative-free 1% lidocaine was injected into the anterior chamber. The anterior chamber was filled with Viscoat viscoelastic.  A 2.4 millimeter keratome was used to make a near-clear corneal incision at the 9:00 position.  A curvilinear capsulorrhexis was made with a cystotome and capsulorrhexis forceps.  Balanced salt solution was used to hydrodissect and hydrodelineate the nucleus.   Phacoemulsification was then used in stop and chop fashion to remove the lens nucleus and epinucleus.  The remaining cortex was then removed using the irrigation and aspiration handpiece. Provisc was then placed into the capsular bag to distend it for lens placement.  A lens was then injected into the capsular bag.  The  remaining viscoelastic was aspirated.   Wounds were hydrated with balanced salt solution.  The anterior chamber was inflated to a physiologic pressure with balanced salt solution.  No wound leaks were noted. Cefuroxime 0.1 ml of a 10mg /ml solution was injected into the anterior chamber for a dose of 1 mg of intracameral antibiotic at the completion of the case.    Timolol and Brimonidine drops were applied to the eye.  The patient was taken to the recovery room in stable condition without complications of anesthesia or surgery.   Meganne Rita 09/19/2019, 9:26 AM

## 2019-09-19 NOTE — Anesthesia Postprocedure Evaluation (Signed)
Anesthesia Post Note  Patient: Amanda Cruz  Procedure(s) Performed: CATARACT EXTRACTION PHACO AND INTRAOCULAR LENS PLACEMENT (IOC) RIGHT PANOPTIX LENS 7.44  00:52.4  14.3% (Right Eye)     Patient location during evaluation: PACU Anesthesia Type: MAC Level of consciousness: awake and alert Pain management: pain level controlled Vital Signs Assessment: post-procedure vital signs reviewed and stable Respiratory status: spontaneous breathing, nonlabored ventilation, respiratory function stable and patient connected to nasal cannula oxygen Cardiovascular status: stable and blood pressure returned to baseline Postop Assessment: no apparent nausea or vomiting Anesthetic complications: no    Breton Berns

## 2019-09-19 NOTE — Anesthesia Preprocedure Evaluation (Signed)
Anesthesia Evaluation  Patient identified by MRN, date of birth, ID band Patient awake    Reviewed: NPO status   History of Anesthesia Complications Negative for: history of anesthetic complications  Airway Mallampati: II  TM Distance: >3 FB Neck ROM: full    Dental no notable dental hx.    Pulmonary neg pulmonary ROS,    Pulmonary exam normal        Cardiovascular Exercise Tolerance: Good hypertension, Normal cardiovascular exam     Neuro/Psych negative neurological ROS  negative psych ROS   GI/Hepatic Neg liver ROS, GERD  Controlled,  Endo/Other  negative endocrine ROS  Renal/GU negative Renal ROS  negative genitourinary   Musculoskeletal   Abdominal   Peds  Hematology negative hematology ROS (+)   Anesthesia Other Findings Covid: NEG.  Had ECCE on 12/23  Reproductive/Obstetrics                             Anesthesia Physical Anesthesia Plan  ASA: II  Anesthesia Plan: MAC   Post-op Pain Management:    Induction:   PONV Risk Score and Plan: 2 and Midazolam and Ondansetron  Airway Management Planned:   Additional Equipment:   Intra-op Plan:   Post-operative Plan:   Informed Consent: I have reviewed the patients History and Physical, chart, labs and discussed the procedure including the risks, benefits and alternatives for the proposed anesthesia with the patient or authorized representative who has indicated his/her understanding and acceptance.       Plan Discussed with: CRNA  Anesthesia Plan Comments:         Anesthesia Quick Evaluation

## 2019-09-19 NOTE — Transfer of Care (Signed)
Immediate Anesthesia Transfer of Care Note  Patient: Amanda Cruz  Procedure(s) Performed: CATARACT EXTRACTION PHACO AND INTRAOCULAR LENS PLACEMENT (IOC) RIGHT PANOPTIX LENS 7.44  00:52.4  14.3% (Right Eye)  Patient Location: PACU  Anesthesia Type: MAC  Level of Consciousness: awake, alert  and patient cooperative  Airway and Oxygen Therapy: Patient Spontanous Breathing and Patient connected to supplemental oxygen  Post-op Assessment: Post-op Vital signs reviewed, Patient's Cardiovascular Status Stable, Respiratory Function Stable, Patent Airway and No signs of Nausea or vomiting  Post-op Vital Signs: Reviewed and stable  Complications: No apparent anesthesia complications

## 2019-09-20 ENCOUNTER — Encounter: Payer: Self-pay | Admitting: *Deleted

## 2019-09-26 ENCOUNTER — Other Ambulatory Visit: Payer: Self-pay

## 2019-09-26 ENCOUNTER — Ambulatory Visit
Admission: RE | Admit: 2019-09-26 | Discharge: 2019-09-26 | Disposition: A | Discharge status: Home / Self Care | Payer: PPO | Source: Ambulatory Visit | Attending: Pulmonary Disease | Admitting: Pulmonary Disease

## 2019-09-26 ENCOUNTER — Encounter: Payer: Self-pay | Admitting: Pulmonary Disease

## 2019-09-26 ENCOUNTER — Ambulatory Visit: Payer: PPO | Admitting: Pulmonary Disease

## 2019-09-26 VITALS — BP 160/92 | HR 76 | Temp 97.0°F | Ht 63.0 in | Wt 166.0 lb

## 2019-09-26 DIAGNOSIS — R0602 Shortness of breath: Secondary | ICD-10-CM | POA: Diagnosis not present

## 2019-09-26 DIAGNOSIS — R059 Cough, unspecified: Secondary | ICD-10-CM

## 2019-09-26 DIAGNOSIS — K219 Gastro-esophageal reflux disease without esophagitis: Secondary | ICD-10-CM

## 2019-09-26 DIAGNOSIS — R05 Cough: Secondary | ICD-10-CM

## 2019-09-26 MED ORDER — PANTOPRAZOLE SODIUM 40 MG PO TBEC: 40.0000 mg | DELAYED_RELEASE_TABLET | Freq: Every day | ORAL | 6 refills | Status: DC

## 2019-09-26 NOTE — Progress Notes (Signed)
    Assessment & Plan:  1. Cough (Primary) - DG Chest 2 View; Future  2. LPRD (laryngopharyngeal reflux disease)  3. Gastroesophageal reflux disease, unspecified whether esophagitis present   Patient Instructions  We will give you a trial of Protonix  1 tablet daily  Try not eating past 8:00 in the evening  We will perform a chest x-ray today  We will see you in follow-up in  4 to 6 weeks time  Please note: late entry documentation due to logistical difficulties during COVID-19 pandemic. This note is filed for information purposes only, and is not intended to be used for billing, nor does it represent the full scope/nature of the visit in question. Please see any associated scanned media linked to date of encounter for additional pertinent information.  Subjective:    HPI: Amanda Cruz is a 74 y.o. female presenting to the pulmonology clinic on 09/26/2019 with report of: pulmonary consult (per Dr. Britta- pt reports of chronic cough, occ wheezing and sob with exertion- cough is non prod. )     Outpatient Encounter Medications as of 09/26/2019  Medication Sig   Biotin w/ Vitamins C & E (HAIR/SKIN/NAILS PO) Take 1 tablet by mouth daily.   Cholecalciferol  (VITAMIN D3) 25 MCG (1000 UT) CAPS Take 1,000 Units by mouth daily.   [DISCONTINUED] alum & mag hydroxide-simeth (MAALOX/MYLANTA) 200-200-20 MG/5ML suspension Take by mouth as needed for indigestion or heartburn. (Patient not taking: Reported on 06/13/2020)   [DISCONTINUED] ASPIRIN  81 PO Take by mouth every other day. (Patient not taking: Reported on 05/20/2021)   [DISCONTINUED] atorvastatin  (LIPITOR ) 20 MG tablet    [DISCONTINUED] fexofenadine (ALLEGRA) 180 MG tablet Take 180 mg by mouth as needed.  (Patient not taking: Reported on 08/08/2020)   [DISCONTINUED] metoprolol  succinate (TOPROL -XL) 50 MG 24 hr tablet Take 50 mg by mouth daily.   [DISCONTINUED] TELMISARTAN PO Take by mouth.   [DISCONTINUED] atorvastatin  (LIPITOR ) 40  MG tablet Take 40 mg by mouth daily.   [DISCONTINUED] pantoprazole  (PROTONIX ) 40 MG tablet Take 1 tablet (40 mg total) by mouth daily. (Patient not taking: Reported on 03/13/2020)   No facility-administered encounter medications on file as of 09/26/2019.      Objective:   Vitals:   09/26/19 1555  BP: (!) 160/92  Pulse: 76  Temp: (!) 97 F (36.1 C)  Height: 5' 3 (1.6 m)  Weight: 166 lb (75.3 kg)  SpO2: 95%  TempSrc: Temporal  BMI (Calculated): 29.41     Physical exam documentation is limited by delayed entry of information.

## 2019-09-26 NOTE — Patient Instructions (Signed)
We will give you a trial of Protonix 1 tablet daily  Try not eating past 8:00 in the evening  We will perform a chest x-ray today  We will see you in follow-up in  4 to 6 weeks time

## 2019-09-27 DIAGNOSIS — I10 Essential (primary) hypertension: Secondary | ICD-10-CM | POA: Diagnosis not present

## 2019-09-27 DIAGNOSIS — J441 Chronic obstructive pulmonary disease with (acute) exacerbation: Secondary | ICD-10-CM | POA: Diagnosis not present

## 2019-09-27 DIAGNOSIS — E669 Obesity, unspecified: Secondary | ICD-10-CM | POA: Diagnosis not present

## 2019-09-27 DIAGNOSIS — R0602 Shortness of breath: Secondary | ICD-10-CM | POA: Diagnosis not present

## 2019-10-25 ENCOUNTER — Ambulatory Visit: Payer: PPO | Admitting: Pulmonary Disease

## 2019-10-25 ENCOUNTER — Other Ambulatory Visit: Payer: Self-pay

## 2019-10-25 ENCOUNTER — Encounter: Payer: Self-pay | Admitting: Pulmonary Disease

## 2019-10-25 VITALS — BP 124/68 | HR 66 | Temp 97.7°F | Ht 63.0 in | Wt 168.4 lb

## 2019-10-25 DIAGNOSIS — K219 Gastro-esophageal reflux disease without esophagitis: Secondary | ICD-10-CM

## 2019-10-25 DIAGNOSIS — R059 Cough, unspecified: Secondary | ICD-10-CM

## 2019-10-25 DIAGNOSIS — R05 Cough: Secondary | ICD-10-CM

## 2019-10-25 NOTE — Progress Notes (Signed)
    Assessment & Plan:  1. Gastroesophageal reflux disease, unspecified whether esophagitis present (Primary) - Ambulatory referral to Gastroenterology  2. Cough  3. LPRD (laryngopharyngeal reflux disease)   Patient Instructions  Continue Protonix  (stomach pill)  To try to not eat after 8 PM at night  We will refer you to gastroenterology for evaluation of your reflux issues.  I think this is also what is making you cough.  Please note: late entry documentation due to logistical difficulties during COVID-19 pandemic. This note is filed for information purposes only, and is not intended to be used for billing, nor does it represent the full scope/nature of the visit in question. Please see any associated scanned media linked to date of encounter for additional pertinent information.  Subjective:    HPI: Amanda Cruz is a 74 y.o. female presenting to the pulmonology clinic on 10/25/2019 with report of: Follow-up (Pt states she is feeling good. Coughing is still the same. Wheezing at night. Pt denies any sob, fever, chills, or sweats)   Amanda Cruz is a 74 year old lifelong never smoker, who follows up for the issue of cough.  This is associated with severe reflux symptoms.  She was started on Protonix  at her initial visit.  She has noted minimal improvement.  Outpatient Encounter Medications as of 10/25/2019  Medication Sig   Biotin w/ Vitamins C & E (HAIR/SKIN/NAILS PO) Take 1 tablet by mouth daily.   Cholecalciferol  (VITAMIN D3) 25 MCG (1000 UT) CAPS Take 1,000 Units by mouth daily.   [DISCONTINUED] alum & mag hydroxide-simeth (MAALOX/MYLANTA) 200-200-20 MG/5ML suspension Take by mouth as needed for indigestion or heartburn. (Patient not taking: Reported on 06/13/2020)   [DISCONTINUED] ASPIRIN  81 PO Take by mouth every other day. (Patient not taking: Reported on 05/20/2021)   [DISCONTINUED] atorvastatin  (LIPITOR ) 40 MG tablet Take 40 mg by mouth daily.   [DISCONTINUED]  fexofenadine (ALLEGRA) 180 MG tablet Take 180 mg by mouth as needed.  (Patient not taking: Reported on 08/08/2020)   [DISCONTINUED] metoprolol  succinate (TOPROL -XL) 50 MG 24 hr tablet Take 50 mg by mouth daily.   [DISCONTINUED] pantoprazole  (PROTONIX ) 40 MG tablet Take 1 tablet (40 mg total) by mouth daily. (Patient not taking: Reported on 03/13/2020)   [DISCONTINUED] TELMISARTAN PO Take by mouth.   No facility-administered encounter medications on file as of 10/25/2019.      Objective:   Vitals:   10/25/19 1531  BP: 124/68  Pulse: 66  Temp: 97.7 F (36.5 C)  Height: 5' 3 (1.6 m)  Weight: 168 lb 6.4 oz (76.4 kg)  SpO2: 97% Comment: on ra  TempSrc: Temporal  BMI (Calculated): 29.84     Physical exam documentation is limited by delayed entry of information.

## 2019-10-25 NOTE — Patient Instructions (Signed)
Continue Protonix (stomach pill)  To try to not eat after 8 PM at night  We will refer you to gastroenterology for evaluation of your reflux issues.  I think this is also what is making you cough.

## 2019-11-29 DIAGNOSIS — R0602 Shortness of breath: Secondary | ICD-10-CM | POA: Diagnosis not present

## 2019-11-29 DIAGNOSIS — I10 Essential (primary) hypertension: Secondary | ICD-10-CM | POA: Diagnosis not present

## 2019-11-29 DIAGNOSIS — J441 Chronic obstructive pulmonary disease with (acute) exacerbation: Secondary | ICD-10-CM | POA: Diagnosis not present

## 2019-11-29 DIAGNOSIS — E669 Obesity, unspecified: Secondary | ICD-10-CM | POA: Diagnosis not present

## 2019-12-27 ENCOUNTER — Ambulatory Visit: Payer: PPO | Admitting: Gastroenterology

## 2019-12-27 ENCOUNTER — Encounter: Payer: Self-pay | Admitting: Gastroenterology

## 2019-12-27 ENCOUNTER — Other Ambulatory Visit: Payer: Self-pay

## 2019-12-27 VITALS — BP 161/67 | HR 78 | Temp 98.2°F | Ht 63.0 in | Wt 165.6 lb

## 2019-12-27 DIAGNOSIS — K219 Gastro-esophageal reflux disease without esophagitis: Secondary | ICD-10-CM | POA: Diagnosis not present

## 2019-12-27 NOTE — Progress Notes (Signed)
Primary Care Physician: [name], MD  Primary Gastroenterologist:  Dr. [name]  Chief Complaint  Patient presents with  . Gastroesophageal Reflux    HPI: Amanda Cruz is a 74 y.o. female here for GERD.  This patient had been seen by me in 2019 for screening colonoscopy.  At that time the patient had 2 small polyps that were serrated polyps.  The patient is now referred by her pulmonologist for GERD.  She reports a dry mouth and a bitter taste in her mouth. She also reports a voice change.  The patient reports that her voice has gotten better since she started on a PPI.  She does report that she continues to have a cough and mucus in the back of her throat.  She was told by pulmonology that may be caused by reflux.  The patient denies any heartburn symptoms at the present time.  She also reports that she sometimes has a bitter taste in her mouth.  Past Medical History:  Diagnosis Date  . GERD (gastroesophageal reflux disease)   . Hypertension   . Wears dentures    full upper and lower    Current Outpatient Medications  Medication Sig Dispense Refill  . alum & mag hydroxide-simeth (MAALOX/MYLANTA) 200-200-20 MG/5ML suspension Take by mouth as needed for indigestion or heartburn.    . ASPIRIN 81 PO Take by mouth every other day.    Marland Kitchen atorvastatin (LIPITOR) 40 MG tablet Take 40 mg by mouth daily.    . Biotin w/ Vitamins C & E (HAIR/SKIN/NAILS PO) Take by mouth daily.    . Cholecalciferol (VITAMIN D3) 25 MCG (1000 UT) CAPS Take by mouth daily.    . fexofenadine (ALLEGRA) 180 MG tablet Take 180 mg by mouth as needed.     . metoprolol succinate (TOPROL-XL) 50 MG 24 hr tablet Take 50 mg by mouth.    . pantoprazole (PROTONIX) 40 MG tablet Take 1 tablet (40 mg total) by mouth daily. 30 tablet 6  . telmisartan-hydrochlorothiazide (MICARDIS HCT) 80-12.5 MG tablet Take 1 tablet by mouth daily.     No current facility-administered medications for this visit.    Allergies as  of 12/27/2019  . (No Known Allergies)    ROS:  General: Negative for anorexia, weight loss, fever, chills, fatigue, weakness. ENT: Negative for hoarseness, difficulty swallowing , nasal congestion. CV: Negative for chest pain, angina, palpitations, dyspnea on exertion, peripheral edema.  Respiratory: Negative for dyspnea at rest, dyspnea on exertion, cough, sputum, wheezing.  GI: See history of present illness. GU:  Negative for dysuria, hematuria, urinary incontinence, urinary frequency, nocturnal urination.  Endo: Negative for unusual weight change.    Physical Examination:   BP (!) 161/67   Pulse 78   Temp 98.2 F (36.8 C) (Oral)   Ht 5\' 3"  (1.6 m)   Wt 165 lb 9.6 oz (75.1 kg)   BMI 29.33 kg/m   General: Well-nourished, well-developed in no acute distress.  Eyes: No icterus. Conjunctivae pink. Lungs: Clear to auscultation bilaterally. Non-labored. Heart: Regular rate and rhythm, no murmurs rubs or gallops.  Abdomen: Bowel sounds are normal, nontender, nondistended, no hepatosplenomegaly or masses, no abdominal bruits or hernia , no rebound or guarding.   Extremities: No lower extremity edema. No clubbing or deformities. Neuro: Alert and oriented x 3.  Grossly intact. Skin: Warm and dry, no jaundice.   Psych: Alert and cooperative, normal mood and affect.  Labs:    Imaging Studies: No results found.  Assessment and Plan:   Amanda Cruz is a 74 y.o. y/o female who comes in with a history of of a voice change a bitter taste in her mouth and a chronic cough.  The patient has been on a PPI without relief of her symptoms of the cough for the mucus but her voice is gotten better.  The patient will be set up for an EGD and will also be set up for esophageal impedance to see if she is actually having reflux as the cause of her symptoms.  The patient has made the plan and agrees with it.     [name], MD. Marval Regal    Note: This dictation was prepared with Dragon  dictation along with smaller phrase technology. Any transcriptional errors that result from this process are unintentional.

## 2020-01-03 ENCOUNTER — Other Ambulatory Visit: Payer: Self-pay

## 2020-01-03 DIAGNOSIS — K219 Gastro-esophageal reflux disease without esophagitis: Secondary | ICD-10-CM

## 2020-02-01 ENCOUNTER — Other Ambulatory Visit: Payer: Self-pay

## 2020-02-01 ENCOUNTER — Other Ambulatory Visit
Admission: RE | Admit: 2020-02-01 | Discharge: 2020-02-01 | Disposition: A | Discharge status: Home / Self Care | Payer: PPO | Source: Ambulatory Visit | Attending: Gastroenterology | Admitting: Gastroenterology

## 2020-02-01 DIAGNOSIS — Z20822 Contact with and (suspected) exposure to covid-19: Secondary | ICD-10-CM | POA: Diagnosis not present

## 2020-02-01 DIAGNOSIS — Z01812 Encounter for preprocedural laboratory examination: Secondary | ICD-10-CM | POA: Diagnosis not present

## 2020-02-02 LAB — SARS CORONAVIRUS 2 (TAT 6-24 HRS): SARS Coronavirus 2: NEGATIVE

## 2020-02-05 ENCOUNTER — Encounter
Admission: RE | Disposition: A | Discharge status: Home / Self Care | Payer: Self-pay | Source: Home / Self Care | Attending: Gastroenterology

## 2020-02-05 ENCOUNTER — Encounter: Payer: Self-pay | Admitting: Gastroenterology

## 2020-02-05 ENCOUNTER — Ambulatory Visit: Payer: PPO | Admitting: Certified Registered Nurse Anesthetist

## 2020-02-05 ENCOUNTER — Other Ambulatory Visit: Payer: Self-pay

## 2020-02-05 ENCOUNTER — Ambulatory Visit
Admission: RE | Admit: 2020-02-05 | Discharge: 2020-02-05 | Disposition: A | Discharge status: Home / Self Care | Payer: PPO | Attending: Gastroenterology | Admitting: Gastroenterology

## 2020-02-05 DIAGNOSIS — K2900 Acute gastritis without bleeding: Secondary | ICD-10-CM | POA: Diagnosis not present

## 2020-02-05 DIAGNOSIS — I10 Essential (primary) hypertension: Secondary | ICD-10-CM | POA: Insufficient documentation

## 2020-02-05 DIAGNOSIS — K219 Gastro-esophageal reflux disease without esophagitis: Secondary | ICD-10-CM | POA: Diagnosis not present

## 2020-02-05 DIAGNOSIS — Z79899 Other long term (current) drug therapy: Secondary | ICD-10-CM | POA: Diagnosis not present

## 2020-02-05 DIAGNOSIS — K449 Diaphragmatic hernia without obstruction or gangrene: Secondary | ICD-10-CM | POA: Insufficient documentation

## 2020-02-05 DIAGNOSIS — Z7982 Long term (current) use of aspirin: Secondary | ICD-10-CM | POA: Diagnosis not present

## 2020-02-05 DIAGNOSIS — K2970 Gastritis, unspecified, without bleeding: Secondary | ICD-10-CM | POA: Insufficient documentation

## 2020-02-05 DIAGNOSIS — K319 Disease of stomach and duodenum, unspecified: Secondary | ICD-10-CM | POA: Diagnosis not present

## 2020-02-05 HISTORY — PX: ESOPHAGOGASTRODUODENOSCOPY (EGD) WITH PROPOFOL: SHX5813

## 2020-02-05 SURGERY — ESOPHAGOGASTRODUODENOSCOPY (EGD) WITH PROPOFOL
Anesthesia: General

## 2020-02-05 MED ORDER — LIDOCAINE HCL (CARDIAC) PF 100 MG/5ML IV SOSY
PREFILLED_SYRINGE | INTRAVENOUS | Status: DC | PRN
  Administered 2020-02-05: 50 mg via INTRAVENOUS

## 2020-02-05 MED ORDER — PROPOFOL 500 MG/50ML IV EMUL
INTRAVENOUS | Status: DC | PRN
  Administered 2020-02-05: 150 ug/kg/min via INTRAVENOUS

## 2020-02-05 MED ORDER — PROPOFOL 500 MG/50ML IV EMUL
INTRAVENOUS | Status: AC
  Filled 2020-02-05: qty 50

## 2020-02-05 MED ORDER — SODIUM CHLORIDE 0.9 % IV SOLN: INTRAVENOUS | Status: DC

## 2020-02-05 MED ORDER — PROPOFOL 10 MG/ML IV BOLUS
INTRAVENOUS | Status: DC | PRN
  Administered 2020-02-05: 50 mg via INTRAVENOUS

## 2020-02-05 NOTE — Anesthesia Postprocedure Evaluation (Signed)
Anesthesia Post Note  Patient: Amanda Cruz  Procedure(s) Performed: ESOPHAGOGASTRODUODENOSCOPY (EGD) WITH PROPOFOL (N/A )  Patient location during evaluation: Endoscopy Anesthesia Type: General Level of consciousness: awake and alert Pain management: pain level controlled Vital Signs Assessment: post-procedure vital signs reviewed and stable Respiratory status: spontaneous breathing, nonlabored ventilation, respiratory function stable and patient connected to nasal cannula oxygen Cardiovascular status: blood pressure returned to baseline and stable Postop Assessment: no apparent nausea or vomiting Anesthetic complications: no     Last Vitals:  Vitals:   02/05/20 0937 02/05/20 0947  BP: (!) 187/85 (!) 171/75  Pulse:    Resp:    Temp:    SpO2:      Last Pain:  Vitals:   02/05/20 0947  TempSrc:   PainSc: 0-No pain                 Martha Clan

## 2020-02-05 NOTE — Anesthesia Preprocedure Evaluation (Signed)
Anesthesia Evaluation  Patient identified by MRN, date of birth, ID band Patient awake    Reviewed: Allergy & Precautions, NPO status , Patient's Chart, lab work & pertinent test results, reviewed documented beta blocker date and time   History of Anesthesia Complications Negative for: history of anesthetic complications  Airway Mallampati: III       Dental  (+) Upper Dentures, Dental Advidsory Given   Pulmonary neg pulmonary ROS,    Pulmonary exam normal        Cardiovascular Exercise Tolerance: Good hypertension, Pt. on medications and Pt. on home beta blockers (-) angina(-) Past MI and (-) Cardiac Stents Normal cardiovascular exam(-) dysrhythmias (-) Valvular Problems/Murmurs     Neuro/Psych negative neurological ROS  negative psych ROS   GI/Hepatic Neg liver ROS, GERD  Medicated,  Endo/Other  negative endocrine ROS  Renal/GU negative Renal ROS  negative genitourinary   Musculoskeletal negative musculoskeletal ROS (+)   Abdominal Normal abdominal exam  (+)   Peds negative pediatric ROS (+)  Hematology negative hematology ROS (+)   Anesthesia Other Findings Past Medical History: No date: GERD (gastroesophageal reflux disease) No date: Hypertension No date: Wears dentures     Comment:  full upper and lower   Reproductive/Obstetrics negative OB ROS                             Anesthesia Physical  Anesthesia Plan  ASA: II  Anesthesia Plan: General   Post-op Pain Management:    Induction: Intravenous  PONV Risk Score and Plan: Propofol infusion and TIVA  Airway Management Planned: Nasal Cannula and Natural Airway  Additional Equipment:   Intra-op Plan:   Post-operative Plan:   Informed Consent: I have reviewed the patients History and Physical, chart, labs and discussed the procedure including the risks, benefits and alternatives for the proposed anesthesia with the  patient or authorized representative who has indicated his/her understanding and acceptance.     Dental advisory given  Plan Discussed with: CRNA and Surgeon  Anesthesia Plan Comments:         Anesthesia Quick Evaluation

## 2020-02-05 NOTE — Op Note (Signed)
[facility] Patient Name: Amanda Cruz Procedure Date: 02/05/2020 8:52 AM MRN: [id number] Account #: [id number] Date of Birth: 1945-10-24 Admit Type: Outpatient Age: 74 Room: [facility] ENDO ROOM 4 Gender: Female Note Status: Finalized Procedure:             Upper GI endoscopy Indications:           Heartburn Providers:             [name] MD, MD Medicines:             Propofol per Anesthesia Complications:         No immediate complications. Procedure:             Pre-Anesthesia Assessment:                        - Prior to the procedure, a History and Physical was                         performed, and patient medications and allergies were                         reviewed. The patient's tolerance of previous                         anesthesia was also reviewed. The risks and benefits                         of the procedure and the sedation options and risks                         were discussed with the patient. All questions were                         answered, and informed consent was obtained. Prior                         Anticoagulants: The patient has taken no previous                         anticoagulant or antiplatelet agents. ASA Grade                         Assessment: II - A patient with mild systemic disease.                         After reviewing the risks and benefits, the patient                         was deemed in satisfactory condition to undergo the                         procedure.                        After obtaining informed consent, the endoscope was                         passed under direct vision. Throughout the procedure,  the patient's blood pressure, pulse, and oxygen                         saturations were monitored continuously. The Endoscope                         was introduced through the mouth, and advanced to the                         second part of duodenum. The upper GI  endoscopy was                         accomplished without difficulty. The patient tolerated                         the procedure well. Findings:      A 5 cm hiatal hernia was present.      Localized moderate inflammation characterized by erythema was found in       the gastric antrum. Biopsies were taken with a cold forceps for       histology.      The examined duodenum was normal.      Two biopsies were obtained with cold forceps for histology in the lower       third of the esophagus. Impression:            - 5 cm hiatal hernia.                        - Gastritis. Biopsied.                        - Normal examined duodenum.                        - Two biopsies were obtained in the lower third of the                         esophagus. Recommendation:        - Await pathology results.                        - Discharge patient to home.                        - Resume previous diet.                        - Continue present medications.                        - Await pathology results. Procedure Code(s):     --- Professional ---                        [id number], Esophagogastroduodenoscopy, flexible,                         transoral; with biopsy, single or multiple Diagnosis Code(s):     --- Professional ---                        R12, Heartburn  K29.70, Gastritis, unspecified, without bleeding CPT copyright 2019 American Medical Association. All rights reserved. The codes documented in this report are preliminary and upon coder review may  be revised to meet current compliance requirements. [name] MD, MD 02/05/2020 9:13:01 AM This report has been signed electronically. Number of Addenda: 0 Note Initiated On: 02/05/2020 8:52 AM Estimated Blood Loss:  Estimated blood loss: none. Estimated blood loss: none.      [facility]

## 2020-02-05 NOTE — H&P (Signed)
Amanda Lame, MD [address] [place], [place] [address] Phone:[phone] Fax : [phone]  Primary Care Physician:  Amanda Athens, MD Primary Gastroenterologist:  Amanda Cruz  Pre-Procedure History & Physical: HPI:  Amanda Cruz is a 74 y.o. female is here for an endoscopy.   Past Medical History:  Diagnosis Date  . GERD (gastroesophageal reflux disease)   . Hypertension   . Wears dentures    full upper and lower    Past Surgical History:  Procedure Laterality Date  . CATARACT EXTRACTION W/PHACO Left 08/22/2019   Procedure: CATARACT EXTRACTION PHACO AND INTRAOCULAR LENS PLACEMENT (IOC) LEFT  9.98  01:10.4  14.2%;  Surgeon: Amanda Koyanagi, MD;  Location: [place];  Service: Ophthalmology;  Laterality: Left;  . CATARACT EXTRACTION W/PHACO Right 09/19/2019   Procedure: CATARACT EXTRACTION PHACO AND INTRAOCULAR LENS PLACEMENT (IOC) RIGHT PANOPTIX LENS 7.44  00:52.4  14.3%;  Surgeon: Amanda Koyanagi, MD;  Location: [place];  Service: Ophthalmology;  Laterality: Right;  . COLONOSCOPY WITH PROPOFOL N/A 05/09/2018   Procedure: COLONOSCOPY WITH PROPOFOL;  Surgeon: Amanda Lame, MD;  Location: [facility] ENDOSCOPY;  Service: Endoscopy;  Laterality: N/A;  . TEAR DUCT PROBING  03/10/2015  . TUBAL LIGATION      Prior to Admission medications   Medication Sig Start Date End Date Taking? Authorizing Provider  alum & mag hydroxide-simeth (MAALOX/MYLANTA) 200-200-20 MG/5ML suspension Take by mouth as needed for indigestion or heartburn.    [provider]  ASPIRIN 81 PO Take by mouth every other day.    [provider]  atorvastatin (LIPITOR) 40 MG tablet Take 40 mg by mouth daily. 09/13/19   [provider]  Biotin w/ Vitamins C & E (HAIR/SKIN/NAILS PO) Take by mouth daily.    [provider]  Cholecalciferol (VITAMIN D3) 25 MCG (1000 UT) CAPS Take by mouth daily.    [provider]  fexofenadine  (ALLEGRA) 180 MG tablet Take 180 mg by mouth as needed.     [provider]  metoprolol succinate (TOPROL-XL) 50 MG 24 hr tablet Take 50 mg by mouth.    [provider]  pantoprazole (PROTONIX) 40 MG tablet Take 1 tablet (40 mg total) by mouth daily. 09/26/19   Amanda Pita, MD  telmisartan-hydrochlorothiazide (MICARDIS HCT) 80-12.5 MG tablet Take 1 tablet by mouth daily. 10/31/19   [provider]    Allergies as of 01/03/2020  . (No Known Allergies)    Family History  Problem Relation Age of Onset  . Stroke Mother   . Heart failure Mother   . Thyroid disease Paternal Grandfather     Social History   Socioeconomic History  . Marital status: Married    Spouse name: Not on file  . Number of children: Not on file  . Years of education: Not on file  . Highest education level: Not on file  Occupational History  . Not on file  Tobacco Use  . Smoking status: Never Smoker  . Smokeless tobacco: Never Used  Substance and Sexual Activity  . Alcohol use: Yes    Alcohol/week: 1.0 standard drinks    Types: 1 Glasses of wine per week  . Drug use: No  . Sexual activity: Not Currently    Birth control/protection: Post-menopausal  Other Topics Concern  . Not on file  Social History Narrative  . Not on file   Social Determinants of Health   Financial Resource Strain:   . Difficulty of Paying Living  Expenses:   Food Insecurity:   . Worried About Charity fundraiser in the Last Year:   . Arboriculturist in the Last Year:   Transportation Needs:   . Film/video editor (Medical):   Marland Kitchen Lack of Transportation (Non-Medical):   Physical Activity:   . Days of Exercise per Week:   . Minutes of Exercise per Session:   Stress:   . Feeling of Stress :   Social Connections:   . Frequency of Communication with Friends and Family:   . Frequency of Social Gatherings with Friends and Family:   . Attends Religious Services:   . Active Member of Clubs or  Organizations:   . Attends Archivist Meetings:   Marland Kitchen Marital Status:   Intimate Partner Violence:   . Fear of Current or Ex-Partner:   . Emotionally Abused:   Marland Kitchen Physically Abused:   . Sexually Abused:     Review of Systems: See HPI, otherwise negative ROS  Physical Exam: There were no vitals taken for this visit. General:   Alert,  pleasant and cooperative in NAD Head:  Normocephalic and atraumatic. Neck:  Supple; no masses or thyromegaly. Lungs:  Clear throughout to auscultation.    Heart:  Regular rate and rhythm. Abdomen:  Soft, nontender and nondistended. Normal bowel sounds, without guarding, and without rebound.   Neurologic:  Alert and  oriented x4;  grossly normal neurologically.  Impression/Plan: [name] is here for an endoscopy to be performed for GERD  Risks, benefits, limitations, and alternatives regarding  endoscopy have been reviewed with the patient.  Questions have been answered.  All parties agreeable.   Amanda Lame, MD  02/05/2020, 8:23 AM

## 2020-02-05 NOTE — Transfer of Care (Signed)
Immediate Anesthesia Transfer of Care Note  Patient: Amanda Cruz  Procedure(s) Performed: ESOPHAGOGASTRODUODENOSCOPY (EGD) WITH PROPOFOL (N/A )  Patient Location: PACU  Anesthesia Type:General  Level of Consciousness: awake and drowsy  Airway & Oxygen Therapy: Patient Spontanous Breathing  Post-op Assessment: Report given to RN and Post -op Vital signs reviewed and stable  Post vital signs: Reviewed and stable  Last Vitals:  Vitals Value Taken Time  BP 141/71 02/05/20 0918  Temp    Pulse 69 02/05/20 0918  Resp 16 02/05/20 0918  SpO2 96 % 02/05/20 0918    Last Pain:  Vitals:   02/05/20 0826  TempSrc: Temporal  PainSc: 0-No pain         Complications: none

## 2020-02-06 ENCOUNTER — Encounter: Payer: Self-pay | Admitting: *Deleted

## 2020-02-06 LAB — SURGICAL PATHOLOGY

## 2020-02-07 ENCOUNTER — Encounter: Payer: Self-pay | Admitting: Gastroenterology

## 2020-03-04 ENCOUNTER — Ambulatory Visit: Payer: PPO | Admitting: Internal Medicine

## 2020-03-13 ENCOUNTER — Encounter: Payer: Self-pay | Admitting: Internal Medicine

## 2020-03-13 ENCOUNTER — Ambulatory Visit (INDEPENDENT_AMBULATORY_CARE_PROVIDER_SITE_OTHER): Payer: PPO | Admitting: Internal Medicine

## 2020-03-13 ENCOUNTER — Other Ambulatory Visit: Payer: Self-pay

## 2020-03-13 VITALS — BP 154/77 | HR 91 | Ht 63.0 in | Wt 167.3 lb

## 2020-03-13 DIAGNOSIS — I872 Venous insufficiency (chronic) (peripheral): Secondary | ICD-10-CM | POA: Diagnosis not present

## 2020-03-13 DIAGNOSIS — I10 Essential (primary) hypertension: Secondary | ICD-10-CM

## 2020-03-13 DIAGNOSIS — K219 Gastro-esophageal reflux disease without esophagitis: Secondary | ICD-10-CM | POA: Diagnosis not present

## 2020-03-13 DIAGNOSIS — E782 Mixed hyperlipidemia: Secondary | ICD-10-CM

## 2020-03-13 NOTE — Assessment & Plan Note (Signed)
Patient has an endoscopy which was unremarkable

## 2020-03-13 NOTE — Assessment & Plan Note (Signed)
-   Today, the patient's blood pressure is well managed on regime. - The patient will continue the current treatment regimen.  - I encouraged the patient to eat a low-sodium diet to help control blood pressure. - I encouraged the patient to live an active lifestyle and complete activities that increases heart rate to 85% target heart rate at least 5 times per week for one hour.

## 2020-03-13 NOTE — Assessment & Plan Note (Signed)
Insufficiency and likely fixation of the skin of the both legs.  The front.  dont have deep venous thrombosis.  Referred back to the dermatologist for evaluation.

## 2020-03-13 NOTE — Assessment & Plan Note (Signed)
On statin.

## 2020-03-13 NOTE — Progress Notes (Signed)
Patient ID: Amanda Cruz, female   DOB: 1946/07/17, 73 y.o.   MRN: [id number]    Established Patient Office Visit  Subjective:  Patient ID: Amanda Cruz, female    DOB: 1945/09/25  Age: 74 y.o. MRN: [id number]  CC:  Chief Complaint  Patient presents with  . Hypertension    3 month blood pressure follow up    HPI  Brain Amanda Cruz presents for a 3 month follow up regarding her blood pressure. She reports that she has a chronic cough. She is currently taking Allegra for this. 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 The patient saw a dermatologist for this problem but the medication she was given did not help. Denies shortness of breath, weight loss, chest pain, and hemoptysis. She has never smoked.  The patient had an upper endoscopy on 02/05/2020 by Dr. Allen Norris which revealed a 5 cm hiatal hernia and gastritis. Duodenum was normal.   Past Medical History:  Diagnosis Date  . GERD (gastroesophageal reflux disease)   . Hypertension   . Wears dentures    full upper and lower    Past Surgical History:  Procedure Laterality Date  . CATARACT EXTRACTION W/PHACO Left 08/22/2019   Procedure: CATARACT EXTRACTION PHACO AND INTRAOCULAR LENS PLACEMENT (IOC) LEFT  9.98  01:10.4  14.2%;  Surgeon: Leandrew Koyanagi, MD;  Location: [place];  Service: Ophthalmology;  Laterality: Left;  . CATARACT EXTRACTION W/PHACO Right 09/19/2019   Procedure: CATARACT EXTRACTION PHACO AND INTRAOCULAR LENS PLACEMENT (IOC) RIGHT PANOPTIX LENS 7.44  00:52.4  14.3%;  Surgeon: Leandrew Koyanagi, MD;  Location: [place];  Service: Ophthalmology;  Laterality: Right;  . COLONOSCOPY WITH PROPOFOL N/A 05/09/2018   Procedure: COLONOSCOPY WITH PROPOFOL;  Surgeon: [name], MD;  Location: [facility] ENDOSCOPY;  Service: Endoscopy;  Laterality: N/A;  . ESOPHAGOGASTRODUODENOSCOPY (EGD) WITH PROPOFOL N/A 02/05/2020   Procedure: ESOPHAGOGASTRODUODENOSCOPY (EGD) WITH PROPOFOL;   Surgeon: [name], MD;  Location: [facility] ENDOSCOPY;  Service: Endoscopy;  Laterality: N/A;  . EYE SURGERY    . TEAR DUCT PROBING  03/10/2015  . TUBAL LIGATION      Family History  Problem Relation Age of Onset  . Stroke Mother   . Heart failure Mother   . Thyroid disease Paternal Grandfather     Social History   Socioeconomic History  . Marital status: Married    Spouse name: Not on file  . Number of children: Not on file  . Years of education: Not on file  . Highest education level: Not on file  Occupational History  . Not on file  Tobacco Use  . Smoking status: Never Smoker  . Smokeless tobacco: Never Used  Vaping Use  . Vaping Use: Never used  Substance and Sexual Activity  . Alcohol use: Not Currently    Alcohol/week: 1.0 standard drink    Types: 1 Glasses of wine per week  . Drug use: No  . Sexual activity: Not Currently    Birth control/protection: Post-menopausal  Other Topics Concern  . Not on file  Social History Narrative  . Not on file   Social Determinants of Health   Financial Resource Strain:   . Difficulty of Paying Living Expenses:   Food Insecurity:   . Worried About Charity fundraiser in the Last Year:   . Arboriculturist in the Last Year:   Transportation Needs:   . Film/video editor (Medical):   Marland Kitchen Lack of Transportation (  Non-Medical):   Physical Activity:   . Days of Exercise per Week:   . Minutes of Exercise per Session:   Stress:   . Feeling of Stress :   Social Connections:   . Frequency of Communication with Friends and Family:   . Frequency of Social Gatherings with Friends and Family:   . Attends Religious Services:   . Active Member of Clubs or Organizations:   . Attends Archivist Meetings:   Marland Kitchen Marital Status:   Intimate Partner Violence:   . Fear of Current or Ex-Partner:   . Emotionally Abused:   Marland Kitchen Physically Abused:   . Sexually Abused:      Current Outpatient Medications:  .  alum & mag  hydroxide-simeth (MAALOX/MYLANTA) 200-200-20 MG/5ML suspension, Take by mouth as needed for indigestion or heartburn., Disp: , Rfl:  .  ASPIRIN 81 PO, Take by mouth every other day., Disp: , Rfl:  .  atorvastatin (LIPITOR) 40 MG tablet, Take 40 mg by mouth daily., Disp: , Rfl:  .  Biotin w/ Vitamins C & E (HAIR/SKIN/NAILS PO), Take by mouth daily., Disp: , Rfl:  .  Cholecalciferol (VITAMIN D3) 25 MCG (1000 UT) CAPS, Take by mouth daily., Disp: , Rfl:  .  fexofenadine (ALLEGRA) 180 MG tablet, Take 180 mg by mouth as needed. , Disp: , Rfl:  .  metoprolol succinate (TOPROL-XL) 50 MG 24 hr tablet, Take 50 mg by mouth., Disp: , Rfl:  .  omeprazole (PRILOSEC) 40 MG capsule, Take 40 mg by mouth daily., Disp: , Rfl:  .  telmisartan-hydrochlorothiazide (MICARDIS HCT) 80-12.5 MG tablet, Take 1 tablet by mouth daily., Disp: , Rfl:    No Known Allergies  ROS Review of Systems  Constitutional: Negative.  Negative for unexpected weight change.  HENT: Negative.        Denies hemoptysis.  Eyes: Negative.   Respiratory: Positive for cough (chronic). Negative for shortness of breath.   Cardiovascular: Negative.  Negative for chest pain.  Gastrointestinal: Negative.   Endocrine: Negative.   Genitourinary: Negative.   Musculoskeletal: Negative.   Skin: Positive for color change (rash/bruising on anterior lower legs).  Allergic/Immunologic: Positive for environmental allergies (cough).  Neurological: Negative.   Hematological: Negative.   Psychiatric/Behavioral: Negative.   All other systems reviewed and are negative.     Objective:    Physical Exam Constitutional:      General: She is not in acute distress.    Appearance: Normal appearance. She is well-developed.     Interventions: Face mask in place.  HENT:     Head: Normocephalic and atraumatic.     Right Ear: Hearing normal.     Left Ear: Hearing normal.     Mouth/Throat:     Mouth: No oral lesions.  Eyes:     Conjunctiva/sclera:  Conjunctivae normal.     Pupils: Pupils are equal, round, and reactive to light.  Cardiovascular:     Rate and Rhythm: Normal rate and regular rhythm.     Heart sounds: Normal heart sounds. No murmur heard.  No friction rub. No gallop.   Pulmonary:     Effort: Pulmonary effort is normal.     Breath sounds: Normal breath sounds. No wheezing, rhonchi or rales.  Abdominal:     General: Bowel sounds are normal.     Palpations: Abdomen is soft. There is no mass.     Tenderness: There is no abdominal tenderness.  Musculoskeletal:        General: No  tenderness. Normal range of motion.     Cervical back: Normal range of motion and neck supple.  Lymphadenopathy:     Cervical: No cervical adenopathy.     Right cervical: No superficial cervical adenopathy. Skin:    General: Skin is warm and dry.     Findings: No bruising, erythema, lesion or rash.     Comments: Lichen planus on anterior aspects of lower legs  Neurological:     Mental Status: She is alert and oriented to person, place, and time.  Psychiatric:        Behavior: Behavior normal.        Thought Content: Thought content normal.        Judgment: Judgment normal.     BP (!) 154/77   Pulse 91   Ht _0  (1.6 m)   Wt 167 lb 4.8 oz (75.9 kg)   BMI 29.64 kg/m  Wt Readings from Last 3 Encounters:  03/13/20 167 lb 4.8 oz (75.9 kg)  02/05/20 163 lb (73.9 kg)  12/27/19 165 lb 9.6 oz (75.1 kg)     Health Maintenance Due  Topic Date Due  . Hepatitis C Screening  Never done  . TETANUS/TDAP  Never done  . PNA vac Low Risk Adult (1 of 2 - PCV13) Never done  . COVID-19 Vaccine (2 - Pfizer 2-dose series) 11/14/2019    There are no preventive care reminders to display for this patient.  Lab Results  Component Value Date   TSH 1.520 10/16/2015   Lab Results  Component Value Date   WBC 4.0 10/16/2015   HGB 13.0 10/16/2015   HCT 37.6 10/16/2015   MCV 87 10/16/2015   PLT 189 10/16/2015   No results found for: NA, K,  CHLORIDE, CO2, GLUCOSE, BUN, CREATININE, BILITOT, ALKPHOS, AST, ALT, PROT, ALBUMIN, CALCIUM, ANIONGAP, EGFR, GFR No results found for: CHOL No results found for: HDL No results found for: LDLCALC No results found for: TRIG No results found for: CHOLHDL No results found for: HGBA1C    Assessment & Plan:   Problem List Items Addressed This Visit      Cardiovascular and Mediastinum   Essential (primary) hypertension - Primary    - Today, the patient's blood pressure is well managed on regime. - The patient will continue the current treatment regimen.  - I encouraged the patient to eat a low-sodium diet to help control blood pressure. - I encouraged the patient to live an active lifestyle and complete activities that increases heart rate to 85% target heart rate at least 5 times per week for one hour.          Chronic venous insufficiency    Insufficiency and likely fixation of the skin of the both legs.  The front.  dont have deep venous thrombosis.  Referred back to the dermatologist for evaluation.        Digestive   Acid reflux    Patient has an endoscopy which was unremarkable      Relevant Medications   omeprazole (PRILOSEC) 40 MG capsule     Other   Hyperlipidemia    On statin.         No orders of the defined types were placed in this encounter.   Follow-up: Return in about 2 months (around 05/14/2020).   By signing my name below, I, De Burrs, attest that this documentation has been prepared under the direction and in the presence of [name], MD. Electronically Signed: De Burrs, Medical  Scribe. 03/13/20. 3:05 PM. I personally performed the services described in this documentation, which was SCRIBED in my presence. The recorded information has been reviewed and considered accurate. It has been edited as necessary during review. [name], MD

## 2020-04-17 ENCOUNTER — Other Ambulatory Visit: Payer: Self-pay

## 2020-04-17 ENCOUNTER — Encounter: Payer: Self-pay | Admitting: Internal Medicine

## 2020-04-17 ENCOUNTER — Ambulatory Visit (INDEPENDENT_AMBULATORY_CARE_PROVIDER_SITE_OTHER): Payer: PPO | Admitting: Internal Medicine

## 2020-04-17 VITALS — BP 160/82 | HR 87 | Ht 62.0 in | Wt 163.4 lb

## 2020-04-17 DIAGNOSIS — I10 Essential (primary) hypertension: Secondary | ICD-10-CM

## 2020-04-17 DIAGNOSIS — E782 Mixed hyperlipidemia: Secondary | ICD-10-CM | POA: Diagnosis not present

## 2020-04-17 DIAGNOSIS — L309 Dermatitis, unspecified: Secondary | ICD-10-CM | POA: Insufficient documentation

## 2020-04-17 DIAGNOSIS — K219 Gastro-esophageal reflux disease without esophagitis: Secondary | ICD-10-CM | POA: Diagnosis not present

## 2020-04-17 DIAGNOSIS — N814 Uterovaginal prolapse, unspecified: Secondary | ICD-10-CM

## 2020-04-17 DIAGNOSIS — Z0000 Encounter for general adult medical examination without abnormal findings: Secondary | ICD-10-CM | POA: Diagnosis not present

## 2020-04-17 LAB — POCT URINALYSIS DIPSTICK
Bilirubin, UA: NEGATIVE
Blood, UA: NEGATIVE
Glucose, UA: NEGATIVE
Ketones, UA: NEGATIVE
Nitrite, UA: NEGATIVE
Protein, UA: NEGATIVE
Spec Grav, UA: 1.015 (ref 1.010–1.025)
Urobilinogen, UA: 0.2 E.U./dL
pH, UA: 5.5 (ref 5.0–8.0)

## 2020-04-17 MED ORDER — NITROFURANTOIN MACROCRYSTAL 25 MG PO CAPS: 25.0000 mg | ORAL_CAPSULE | Freq: Four times a day (QID) | ORAL | 0 refills | Status: AC

## 2020-04-17 NOTE — Assessment & Plan Note (Signed)
Ref to dermatology

## 2020-04-17 NOTE — Assessment & Plan Note (Signed)
Will treat  uti

## 2020-04-17 NOTE — Progress Notes (Signed)
Established Patient Office Visit  Subjective:  Patient ID: Amanda Cruz, female    DOB: November 08, 1945  Age: 74 y.o. MRN: [id number]  CC:  Chief Complaint  Patient presents with  . Annual Exam    Urinary Tract Infection  This is a recurrent problem. The quality of the pain is described as burning. The pain is at a severity of 2/10. The pain is mild. There has been no fever. The fever has been present for 3 - 4 days. She is not sexually active. Pertinent negatives include no chills, flank pain, hematuria, nausea or sweats. There is no history of kidney stones.    [name] presents for check up  Past Medical History:  Diagnosis Date  . GERD (gastroesophageal reflux disease)   . Hypertension   . Wears dentures    full upper and lower    Past Surgical History:  Procedure Laterality Date  . CATARACT EXTRACTION W/PHACO Left 08/22/2019   Procedure: CATARACT EXTRACTION PHACO AND INTRAOCULAR LENS PLACEMENT (IOC) LEFT  9.98  01:10.4  14.2%;  Surgeon: Leandrew Koyanagi, MD;  Location: [place];  Service: Ophthalmology;  Laterality: Left;  . CATARACT EXTRACTION W/PHACO Right 09/19/2019   Procedure: CATARACT EXTRACTION PHACO AND INTRAOCULAR LENS PLACEMENT (IOC) RIGHT PANOPTIX LENS 7.44  00:52.4  14.3%;  Surgeon: Leandrew Koyanagi, MD;  Location: [place];  Service: Ophthalmology;  Laterality: Right;  . COLONOSCOPY WITH PROPOFOL N/A 05/09/2018   Procedure: COLONOSCOPY WITH PROPOFOL;  Surgeon: [name], MD;  Location: [facility] ENDOSCOPY;  Service: Endoscopy;  Laterality: N/A;  . ESOPHAGOGASTRODUODENOSCOPY (EGD) WITH PROPOFOL N/A 02/05/2020   Procedure: ESOPHAGOGASTRODUODENOSCOPY (EGD) WITH PROPOFOL;  Surgeon: [name], MD;  Location: [facility] ENDOSCOPY;  Service: Endoscopy;  Laterality: N/A;  . EYE SURGERY    . TEAR DUCT PROBING  03/10/2015  . TUBAL LIGATION      Family History  Problem Relation Age of Onset  . Stroke Mother   . Heart failure Mother   .  Thyroid disease Paternal Grandfather     Social History   Socioeconomic History  . Marital status: Married    Spouse name: Not on file  . Number of children: Not on file  . Years of education: Not on file  . Highest education level: Not on file  Occupational History  . Not on file  Tobacco Use  . Smoking status: Never Smoker  . Smokeless tobacco: Never Used  Vaping Use  . Vaping Use: Never used  Substance and Sexual Activity  . Alcohol use: Not Currently    Alcohol/week: 1.0 standard drink    Types: 1 Glasses of wine per week  . Drug use: No  . Sexual activity: Not Currently    Birth control/protection: Post-menopausal  Other Topics Concern  . Not on file  Social History Narrative  . Not on file   Social Determinants of Health   Financial Resource Strain:   . Difficulty of Paying Living Expenses: Not on file  Food Insecurity:   . Worried About Charity fundraiser in the Last Year: Not on file  . Ran Out of Food in the Last Year: Not on file  Transportation Needs:   . Lack of Transportation (Medical): Not on file  . Lack of Transportation (Non-Medical): Not on file  Physical Activity:   . Days of Exercise per Week: Not on file  . Minutes of Exercise per Session: Not on file  Stress:   . Feeling of Stress : Not on  file  Social Connections:   . Frequency of Communication with Friends and Family: Not on file  . Frequency of Social Gatherings with Friends and Family: Not on file  . Attends Religious Services: Not on file  . Active Member of Clubs or Organizations: Not on file  . Attends Archivist Meetings: Not on file  . Marital Status: Not on file  Intimate Partner Violence:   . Fear of Current or Ex-Partner: Not on file  . Emotionally Abused: Not on file  . Physically Abused: Not on file  . Sexually Abused: Not on file     Current Outpatient Medications:  .  alum & mag hydroxide-simeth (MAALOX/MYLANTA) 200-200-20 MG/5ML suspension, Take by mouth as  needed for indigestion or heartburn., Disp: , Rfl:  .  ASPIRIN 81 PO, Take by mouth every other day., Disp: , Rfl:  .  atorvastatin (LIPITOR) 40 MG tablet, Take 40 mg by mouth daily., Disp: , Rfl:  .  Biotin w/ Vitamins C & E (HAIR/SKIN/NAILS PO), Take by mouth daily., Disp: , Rfl:  .  Cholecalciferol (VITAMIN D3) 25 MCG (1000 UT) CAPS, Take by mouth daily., Disp: , Rfl:  .  fexofenadine (ALLEGRA) 180 MG tablet, Take 180 mg by mouth as needed. , Disp: , Rfl:  .  metoprolol succinate (TOPROL-XL) 50 MG 24 hr tablet, Take 50 mg by mouth., Disp: , Rfl:  .  omeprazole (PRILOSEC) 40 MG capsule, Take 40 mg by mouth daily., Disp: , Rfl:  .  telmisartan-hydrochlorothiazide (MICARDIS HCT) 80-12.5 MG tablet, Take 1 tablet by mouth daily., Disp: , Rfl:  .  nitrofurantoin (MACRODANTIN) 25 MG capsule, Take 1 capsule (25 mg total) by mouth 4 (four) times daily for 10 days., Disp: 40 capsule, Rfl: 0   No Known Allergies  ROS Review of Systems  Constitutional: Negative for chills.  Gastrointestinal: Negative for nausea.  Genitourinary: Negative for flank pain and hematuria.      Objective:    Physical Exam  BP (!) 160/82   Pulse 87   Ht '5\' 2"'  (1.575 m)   Wt 163 lb 6.4 oz (74.1 kg)   BMI 29.89 kg/m  Wt Readings from Last 3 Encounters:  04/17/20 163 lb 6.4 oz (74.1 kg)  03/13/20 167 lb 4.8 oz (75.9 kg)  02/05/20 163 lb (73.9 kg)     Health Maintenance Due  Topic Date Due  . Hepatitis C Screening  Never done  . TETANUS/TDAP  Never done  . PNA vac Low Risk Adult (1 of 2 - PCV13) Never done  . COVID-19 Vaccine (2 - Pfizer 2-dose series) 11/14/2019  . INFLUENZA VACCINE  03/30/2020    There are no preventive care reminders to display for this patient.  Lab Results  Component Value Date   TSH 1.520 10/16/2015   Lab Results  Component Value Date   WBC 4.0 10/16/2015   HGB 13.0 10/16/2015   HCT 37.6 10/16/2015   MCV 87 10/16/2015   PLT 189 10/16/2015   No results found for: NA, K,  CHLORIDE, CO2, GLUCOSE, BUN, CREATININE, BILITOT, ALKPHOS, AST, ALT, PROT, ALBUMIN, CALCIUM, ANIONGAP, EGFR, GFR No results found for: CHOL No results found for: HDL No results found for: LDLCALC No results found for: TRIG No results found for: CHOLHDL No results found for: HGBA1C    Assessment & Plan:   Problem List Items Addressed This Visit      Cardiovascular and Mediastinum   Essential (primary) hypertension    - Today, the  patient's blood pressure is well managed on betablocker's  And  Arbs. - The patient will  Controlled on the current treatment regimen.  - I encouraged the patient to eat a low-sodium diet to help control blood pressure. - I encouraged the patient to live an active lifestyle and complete activities that increases heart rate to 85% target heart rate at least 5 times per week for one hour.            Digestive   Acid reflux    - The patient's GERD is stable on medication.  - Instructed the patient to avoid eating spicy and acidic foods, as well as foods high in fat. - Instructed the patient to avoid eating large meals or meals 2-3 hours prior to sleeping.         Musculoskeletal and Integument   Dermatitis    Ref to dermatology         Genitourinary   Cystocele with uterine prolapse    ch problem      Relevant Medications   nitrofurantoin (MACRODANTIN) 25 MG capsule   Other Relevant Orders   POCT urinalysis dipstick (Completed)     Other   Annual physical exam    Will treat  uti      Hyperlipidemia - Primary    - The patient's hyperlipidemia is stable on lipitor. - The patient will continue the current treatment regimen.  - I encouraged the patient to eat more vegetables and whole wheat, and to avoid fatty foods like whole milk, hard cheese, egg yolks, margarine, baked sweets, and fried foods.  - I encouraged the patient to live an active lifestyle and complete activities for 40 minutes at least three times per week.  - I instructed  the patient to go to the ER if they begin having chest pain.            Meds ordered this encounter  Medications  . nitrofurantoin (MACRODANTIN) 25 MG capsule    Sig: Take 1 capsule (25 mg total) by mouth 4 (four) times daily for 10 days.    Dispense:  40 capsule    Refill:  0    Follow-up: No follow-ups on file.    [name], MD

## 2020-04-17 NOTE — Assessment & Plan Note (Signed)
-   Today, the patient's blood pressure is well managed on betablocker's  And  Arbs. - The patient will  Controlled on the current treatment regimen.  - I encouraged the patient to eat a low-sodium diet to help control blood pressure. - I encouraged the patient to live an active lifestyle and complete activities that increases heart rate to 85% target heart rate at least 5 times per week for one hour.

## 2020-04-17 NOTE — Assessment & Plan Note (Signed)
ch problem 

## 2020-04-17 NOTE — Assessment & Plan Note (Signed)
-   The patient's GERD is stable on medication.  - Instructed the patient to avoid eating spicy and acidic foods, as well as foods high in fat. - Instructed the patient to avoid eating large meals or meals 2-3 hours prior to sleeping. 

## 2020-04-17 NOTE — Assessment & Plan Note (Addendum)
-   The patient's hyperlipidemia is stable on lipitor. - The patient will continue the current treatment regimen.  - I encouraged the patient to eat more vegetables and whole wheat, and to avoid fatty foods like whole milk, hard cheese, egg yolks, margarine, baked sweets, and fried foods.  - I encouraged the patient to live an active lifestyle and complete activities for 40 minutes at least three times per week.  - I instructed the patient to go to the ER if they begin having chest pain.   

## 2020-04-22 DIAGNOSIS — J301 Allergic rhinitis due to pollen: Secondary | ICD-10-CM | POA: Diagnosis not present

## 2020-04-22 DIAGNOSIS — R0982 Postnasal drip: Secondary | ICD-10-CM | POA: Diagnosis not present

## 2020-04-28 ENCOUNTER — Other Ambulatory Visit: Payer: Self-pay | Admitting: Internal Medicine

## 2020-05-08 DIAGNOSIS — H26492 Other secondary cataract, left eye: Secondary | ICD-10-CM | POA: Diagnosis not present

## 2020-05-23 DIAGNOSIS — H9211 Otorrhea, right ear: Secondary | ICD-10-CM | POA: Diagnosis not present

## 2020-05-23 DIAGNOSIS — R0982 Postnasal drip: Secondary | ICD-10-CM | POA: Diagnosis not present

## 2020-05-23 DIAGNOSIS — J300 Vasomotor rhinitis: Secondary | ICD-10-CM | POA: Diagnosis not present

## 2020-05-29 ENCOUNTER — Ambulatory Visit (INDEPENDENT_AMBULATORY_CARE_PROVIDER_SITE_OTHER): Payer: PPO | Admitting: Internal Medicine

## 2020-05-29 ENCOUNTER — Encounter: Payer: Self-pay | Admitting: Internal Medicine

## 2020-05-29 ENCOUNTER — Other Ambulatory Visit: Payer: Self-pay

## 2020-05-29 VITALS — HR 94 | Resp 20 | Ht 62.0 in | Wt 163.4 lb

## 2020-05-29 DIAGNOSIS — I872 Venous insufficiency (chronic) (peripheral): Secondary | ICD-10-CM

## 2020-05-29 DIAGNOSIS — U071 COVID-19: Secondary | ICD-10-CM | POA: Diagnosis not present

## 2020-05-29 DIAGNOSIS — J4520 Mild intermittent asthma, uncomplicated: Secondary | ICD-10-CM | POA: Diagnosis not present

## 2020-05-29 NOTE — Assessment & Plan Note (Addendum)
No swelling lower extremities pedal pulses 1+.

## 2020-05-29 NOTE — Assessment & Plan Note (Addendum)
Member alert and oriented wheezing end expiratory, constitutional within normal limits.  Discussed vitamin C and zinc son will pick that up from the pharmacy.  Also discussed monoclonal antibody effusion we will be calling her when appointment is set up member is 6 days post symptom onset.

## 2020-05-29 NOTE — Progress Notes (Signed)
Established Patient Office Visit  SUBJECTIVE:  Subjective  Patient ID: NYJA WESTBROOK, female    DOB: Mar 28, 1946  Age: 74 y.o. MRN: [id number]  CC: No chief complaint on file.   HPI TASIA LIZ is a 74 y.o. female presenting today for a COVID-19 screening.   She presents today with symptoms x50 days of ageusia, body myalgia, intermittent fever, and fatigue. Her husband was sent to the hospital today just prior to her visit with similar symptomology.   COVID-19 rapid test resulted POSITIVE.     Past Medical History:  Diagnosis Date  . GERD (gastroesophageal reflux disease)   . Hypertension   . Wears dentures    full upper and lower    Past Surgical History:  Procedure Laterality Date  . CATARACT EXTRACTION W/PHACO Left 08/22/2019   Procedure: CATARACT EXTRACTION PHACO AND INTRAOCULAR LENS PLACEMENT (IOC) LEFT  9.98  01:10.4  14.2%;  Surgeon: Leandrew Koyanagi, MD;  Location: [place];  Service: Ophthalmology;  Laterality: Left;  . CATARACT EXTRACTION W/PHACO Right 09/19/2019   Procedure: CATARACT EXTRACTION PHACO AND INTRAOCULAR LENS PLACEMENT (IOC) RIGHT PANOPTIX LENS 7.44  00:52.4  14.3%;  Surgeon: Leandrew Koyanagi, MD;  Location: [place];  Service: Ophthalmology;  Laterality: Right;  . COLONOSCOPY WITH PROPOFOL N/A 05/09/2018   Procedure: COLONOSCOPY WITH PROPOFOL;  Surgeon: [name], MD;  Location: [facility] ENDOSCOPY;  Service: Endoscopy;  Laterality: N/A;  . ESOPHAGOGASTRODUODENOSCOPY (EGD) WITH PROPOFOL N/A 02/05/2020   Procedure: ESOPHAGOGASTRODUODENOSCOPY (EGD) WITH PROPOFOL;  Surgeon: [name], MD;  Location: [facility] ENDOSCOPY;  Service: Endoscopy;  Laterality: N/A;  . EYE SURGERY    . TEAR DUCT PROBING  03/10/2015  . TUBAL LIGATION      Family History  Problem Relation Age of Onset  . Stroke Mother   . Heart failure Mother   . Thyroid disease Paternal Grandfather     Social History   Socioeconomic History  . Marital status:  Married    Spouse name: Not on file  . Number of children: Not on file  . Years of education: Not on file  . Highest education level: Not on file  Occupational History  . Not on file  Tobacco Use  . Smoking status: Never Smoker  . Smokeless tobacco: Never Used  Vaping Use  . Vaping Use: Never used  Substance and Sexual Activity  . Alcohol use: Not Currently    Alcohol/week: 1.0 standard drink    Types: 1 Glasses of wine per week  . Drug use: No  . Sexual activity: Not Currently    Birth control/protection: Post-menopausal  Other Topics Concern  . Not on file  Social History Narrative  . Not on file   Social Determinants of Health   Financial Resource Strain:   . Difficulty of Paying Living Expenses: Not on file  Food Insecurity:   . Worried About Charity fundraiser in the Last Year: Not on file  . Ran Out of Food in the Last Year: Not on file  Transportation Needs:   . Lack of Transportation (Medical): Not on file  . Lack of Transportation (Non-Medical): Not on file  Physical Activity:   . Days of Exercise per Week: Not on file  . Minutes of Exercise per Session: Not on file  Stress:   . Feeling of Stress : Not on file  Social Connections:   . Frequency of Communication with Friends and Family: Not on file  . Frequency of Social Gatherings with Friends  and Family: Not on file  . Attends Religious Services: Not on file  . Active Member of Clubs or Organizations: Not on file  . Attends Archivist Meetings: Not on file  . Marital Status: Not on file  Intimate Partner Violence:   . Fear of Current or Ex-Partner: Not on file  . Emotionally Abused: Not on file  . Physically Abused: Not on file  . Sexually Abused: Not on file     Current Outpatient Medications:  .  alum & mag hydroxide-simeth (MAALOX/MYLANTA) 200-200-20 MG/5ML suspension, Take by mouth as needed for indigestion or heartburn., Disp: , Rfl:  .  ASPIRIN 81 PO, Take by mouth every other day.,  Disp: , Rfl:  .  atorvastatin (LIPITOR) 40 MG tablet, Take 1 tablet by mouth once daily, Disp: 90 tablet, Rfl: 0 .  Biotin w/ Vitamins C & E (HAIR/SKIN/NAILS PO), Take by mouth daily., Disp: , Rfl:  .  Cholecalciferol (VITAMIN D3) 25 MCG (1000 UT) CAPS, Take by mouth daily., Disp: , Rfl:  .  fexofenadine (ALLEGRA) 180 MG tablet, Take 180 mg by mouth as needed. , Disp: , Rfl:  .  metoprolol succinate (TOPROL-XL) 50 MG 24 hr tablet, Take 50 mg by mouth., Disp: , Rfl:  .  omeprazole (PRILOSEC) 40 MG capsule, Take 40 mg by mouth daily., Disp: , Rfl:  .  telmisartan-hydrochlorothiazide (MICARDIS HCT) 80-12.5 MG tablet, Take 1 tablet by mouth daily., Disp: , Rfl:    No Known Allergies  ROS Review of Systems  Constitutional: Positive for fatigue and fever.  HENT: Negative.        Ageusia  Eyes: Negative.   Respiratory: Positive for wheezing.   Cardiovascular: Negative.   Gastrointestinal: Negative.   Endocrine: Negative.   Genitourinary: Negative.   Musculoskeletal: Positive for myalgias.  Skin: Negative.   Allergic/Immunologic: Negative.   Neurological: Negative.   Hematological: Negative.   Psychiatric/Behavioral: Negative.   All other systems reviewed and are negative.    OBJECTIVE:    Physical Exam Vitals reviewed.  Constitutional:      Appearance: Normal appearance.  HENT:     Mouth/Throat:     Mouth: Mucous membranes are moist.  Eyes:     Pupils: Pupils are equal, round, and reactive to light.  Neck:     Vascular: No carotid bruit or JVD.  Cardiovascular:     Rate and Rhythm: Normal rate and regular rhythm.     Pulses: Normal pulses.     Heart sounds: Normal heart sounds.  Pulmonary:     Effort: Pulmonary effort is normal.     Breath sounds: Examination of the right-upper field reveals wheezing. Examination of the left-upper field reveals wheezing. Wheezing present. No rhonchi or rales.  Abdominal:     General: Bowel sounds are normal.     Palpations: Abdomen is  soft. There is no hepatomegaly, splenomegaly or mass.     Tenderness: There is no abdominal tenderness.     Hernia: No hernia is present.  Musculoskeletal:        General: No tenderness.     Cervical back: Neck supple.     Right lower leg: No edema.     Left lower leg: No edema.  Skin:    General: Skin is warm.     Findings: No rash.  Neurological:     General: No focal deficit present.     Mental Status: She is alert and oriented to person, place, and time.  Sensory: Sensory deficit (ageusia) present.     Motor: No weakness.  Psychiatric:        Mood and Affect: Mood normal. Affect is flat.        Behavior: Behavior normal.     Pulse 94   Resp 20   Ht 5' 2" (1.575 m)   Wt 163 lb 6 oz (74.1 kg)   BMI 29.88 kg/m  Wt Readings from Last 3 Encounters:  05/29/20 163 lb 6 oz (74.1 kg)  04/17/20 163 lb 6.4 oz (74.1 kg)  03/13/20 167 lb 4.8 oz (75.9 kg)    Health Maintenance Due  Topic Date Due  . Hepatitis C Screening  Never done  . TETANUS/TDAP  Never done  . PNA vac Low Risk Adult (1 of 2 - PCV13) Never done  . COVID-19 Vaccine (2 - Pfizer 2-dose series) 11/14/2019  . INFLUENZA VACCINE  03/30/2020    There are no preventive care reminders to display for this patient.  CBC Latest Ref Rng & Units 10/16/2015  WBC 3.4 - 10.8 x10E3/uL 4.0  Hemoglobin 11.1 - 15.9 g/dL 13.0  Hematocrit 34.0 - 46.6 % 37.6  Platelets 150 - 379 x10E3/uL 189   No flowsheet data found.  Lab Results  Component Value Date   TSH 1.520 10/16/2015   No results found for: ALBUMIN, ANIONGAP, EGFR, GFR No results found for: CHOL, HDL, LDLCALC, CHOLHDL No results found for: TRIG No results found for: HGBA1C    ASSESSMENT & PLAN:   Problem List Items Addressed This Visit      Cardiovascular and Mediastinum   Chronic venous insufficiency - Primary    No swelling lower extremities pedal pulses 1+.        Other   COVID-19 virus infection    Member alert and oriented wheezing end  expiratory, constitutional within normal limits.  Discussed vitamin C and zinc son will pick that up from the pharmacy.  Also discussed monoclonal antibody effusion we will be calling her when appointment is set up member is 6 days post symptom onset.       Other Visit Diagnoses    Mild intermittent asthma without complication          No orders of the defined types were placed in this encounter.   Follow-up: No follow-ups on file.    [name], MD [facility] [address] [place],  [address]   By signing my name below, I, [employer], attest that this documentation has been prepared under the direction and in the presence of Beckie Salts, NP and Dr. [name] Electronically Signed: [name], MD 05/29/20, 12:56 PM  I personally performed the services described in this documentation, which was SCRIBED in my presence. The recorded information has been reviewed and considered accurate. It has been edited as necessary during review. [name], MD

## 2020-05-30 ENCOUNTER — Other Ambulatory Visit: Payer: Self-pay | Admitting: Nurse Practitioner

## 2020-05-30 ENCOUNTER — Ambulatory Visit (HOSPITAL_COMMUNITY)
Admission: RE | Admit: 2020-05-30 | Discharge: 2020-05-30 | Disposition: A | Discharge status: Home / Self Care | Payer: Medicare Other | Source: Ambulatory Visit | Attending: Pulmonary Disease | Admitting: Pulmonary Disease

## 2020-05-30 ENCOUNTER — Telehealth: Payer: Self-pay | Admitting: Nurse Practitioner

## 2020-05-30 DIAGNOSIS — U071 COVID-19: Secondary | ICD-10-CM

## 2020-05-30 DIAGNOSIS — Z23 Encounter for immunization: Secondary | ICD-10-CM | POA: Diagnosis not present

## 2020-05-30 MED ORDER — FAMOTIDINE IN NACL 20-0.9 MG/50ML-% IV SOLN: 20.0000 mg | Freq: Once | INTRAVENOUS | Status: DC | PRN

## 2020-05-30 MED ORDER — ACETAMINOPHEN 325 MG PO TABS
650.0000 mg | ORAL_TABLET | Freq: Four times a day (QID) | ORAL | Status: DC | PRN
  Administered 2020-05-30: 650 mg via ORAL
  Filled 2020-05-30: qty 2

## 2020-05-30 MED ORDER — ALBUTEROL SULFATE HFA 108 (90 BASE) MCG/ACT IN AERS: 2.0000 | INHALATION_SPRAY | Freq: Once | RESPIRATORY_TRACT | Status: DC | PRN

## 2020-05-30 MED ORDER — METHYLPREDNISOLONE SODIUM SUCC 125 MG IJ SOLR: 125.0000 mg | Freq: Once | INTRAMUSCULAR | Status: DC | PRN

## 2020-05-30 MED ORDER — DIPHENHYDRAMINE HCL 50 MG/ML IJ SOLN: 50.0000 mg | Freq: Once | INTRAMUSCULAR | Status: DC | PRN

## 2020-05-30 MED ORDER — SODIUM CHLORIDE 0.9 % IV BOLUS
1000.0000 mL | Freq: Once | INTRAVENOUS | Status: AC
  Administered 2020-05-30: 1000 mL via INTRAVENOUS

## 2020-05-30 MED ORDER — SODIUM CHLORIDE 0.9 % IV SOLN: INTRAVENOUS | Status: DC | PRN

## 2020-05-30 MED ORDER — EPINEPHRINE 0.3 MG/0.3ML IJ SOAJ: 0.3000 mg | Freq: Once | INTRAMUSCULAR | Status: DC | PRN

## 2020-05-30 MED ORDER — SODIUM CHLORIDE 0.9 % IV SOLN
1200.0000 mg | Freq: Once | INTRAVENOUS | Status: AC
  Administered 2020-05-30: 1200 mg via INTRAVENOUS

## 2020-05-30 NOTE — Progress Notes (Signed)
I connected by phone with Amanda Cruz on 05/30/2020 at 11:51 AM to discuss the potential use of a new treatment for mild to moderate COVID-19 viral infection in non-hospitalized patients.  This patient is a 74 y.o. female that meets the FDA criteria for Emergency Use Authorization of COVID monoclonal antibody casirivimab/imdevimab or bamlanivimab/eteseviamb.  Has a (+) direct SARS-CoV-2 viral test result  Has mild or moderate COVID-19   Is NOT hospitalized due to COVID-19  Is within 10 days of symptom onset  Has at least one of the high risk factor(s) for progression to severe COVID-19 and/or hospitalization as defined in EUA.  Specific high risk criteria : Older age (>/= 75 yo)   I have spoken and communicated the following to the patient or parent/caregiver regarding COVID monoclonal antibody treatment:  1. FDA has authorized the emergency use for the treatment of mild to moderate COVID-19 in adults and pediatric patients with positive results of direct SARS-CoV-2 viral testing who are 7 years of age and older weighing at least 40 kg, and who are at high risk for progressing to severe COVID-19 and/or hospitalization.  2. The significant known and potential risks and benefits of COVID monoclonal antibody, and the extent to which such potential risks and benefits are unknown.  3. Information on available alternative treatments and the risks and benefits of those alternatives, including clinical trials.  4. Patients treated with COVID monoclonal antibody should continue to self-isolate and use infection control measures (e.g., wear mask, isolate, social distance, avoid sharing personal items, clean and disinfect "high touch" surfaces, and frequent handwashing) according to CDC guidelines.   5. The patient or parent/caregiver has the option to accept or refuse COVID monoclonal antibody treatment.  After reviewing this information with the patient, the patient has agreed to receive one  of the available covid 19 monoclonal antibodies and will be provided an appropriate fact sheet prior to infusion. Fenton Foy, NP 05/30/2020 11:51 AM

## 2020-05-30 NOTE — Progress Notes (Signed)
  Diagnosis: COVID-19  Physician: Dr. Asencion Noble  Procedure: Covid Infusion Clinic Med: casirivimab\imdevimab infusion - Provided patient with casirivimab\imdevimab fact sheet for patients, parents and caregivers prior to infusion.  Complications: No immediate complications noted.  Discharge: Discharged home   Amanda Cruz 05/30/2020

## 2020-05-30 NOTE — Telephone Encounter (Signed)
Called to Discuss with patient about Covid symptoms and the use of the monoclonal antibody infusion for those with mild to moderate Covid symptoms and at a high risk of hospitalization.     Pt appears to qualify for this infusion due to co-morbid conditions and/or a member of an at-risk group in accordance with the FDA Emergency Use Authorization.    Unable to reach pt    

## 2020-05-30 NOTE — Discharge Instructions (Signed)

## 2020-05-30 NOTE — Progress Notes (Signed)
I connected by phone with Amanda Cruz on 05/30/2020 at 11:05 AM to discuss the potential use of a new treatment for mild to moderate COVID-19 viral infection in non-hospitalized patients.  This patient is a 74 y.o. female that meets the FDA criteria for Emergency Use Authorization of COVID monoclonal antibody casirivimab/imdevimab or bamlanivimab/eteseviamb.  Has a (+) direct SARS-CoV-2 viral test result  Has mild or moderate COVID-19   Is NOT hospitalized due to COVID-19  Is within 10 days of symptom onset  Has at least one of the high risk factor(s) for progression to severe COVID-19 and/or hospitalization as defined in EUA.  Specific high risk criteria : Older age (>/= 75 yo)   I have spoken and communicated the following to the patient or parent/caregiver regarding COVID monoclonal antibody treatment:  1. FDA has authorized the emergency use for the treatment of mild to moderate COVID-19 in adults and pediatric patients with positive results of direct SARS-CoV-2 viral testing who are 71 years of age and older weighing at least 40 kg, and who are at high risk for progressing to severe COVID-19 and/or hospitalization.  2. The significant known and potential risks and benefits of COVID monoclonal antibody, and the extent to which such potential risks and benefits are unknown.  3. Information on available alternative treatments and the risks and benefits of those alternatives, including clinical trials.  4. Patients treated with COVID monoclonal antibody should continue to self-isolate and use infection control measures (e.g., wear mask, isolate, social distance, avoid sharing personal items, clean and disinfect "high touch" surfaces, and frequent handwashing) according to CDC guidelines.   5. The patient or parent/caregiver has the option to accept or refuse COVID monoclonal antibody treatment.  After reviewing this information with the patient, the patient has agreed to receive one  of the available covid 19 monoclonal antibodies and will be provided an appropriate fact sheet prior to infusion. Fenton Foy, NP 05/30/2020 11:05 AM

## 2020-06-13 ENCOUNTER — Ambulatory Visit (INDEPENDENT_AMBULATORY_CARE_PROVIDER_SITE_OTHER): Payer: PPO | Admitting: Family Medicine

## 2020-06-13 ENCOUNTER — Encounter: Payer: Self-pay | Admitting: Family Medicine

## 2020-06-13 ENCOUNTER — Ambulatory Visit
Admission: RE | Admit: 2020-06-13 | Discharge: 2020-06-13 | Disposition: A | Discharge status: Home / Self Care | Payer: PPO | Source: Ambulatory Visit | Attending: Family Medicine | Admitting: Family Medicine

## 2020-06-13 ENCOUNTER — Other Ambulatory Visit: Payer: Self-pay

## 2020-06-13 VITALS — BP 158/92 | HR 86 | Ht 63.0 in | Wt 159.8 lb

## 2020-06-13 DIAGNOSIS — M79605 Pain in left leg: Secondary | ICD-10-CM

## 2020-06-13 DIAGNOSIS — I739 Peripheral vascular disease, unspecified: Secondary | ICD-10-CM

## 2020-06-13 DIAGNOSIS — M79662 Pain in left lower leg: Secondary | ICD-10-CM | POA: Diagnosis not present

## 2020-06-13 DIAGNOSIS — I10 Essential (primary) hypertension: Secondary | ICD-10-CM | POA: Diagnosis not present

## 2020-06-13 DIAGNOSIS — M5432 Sciatica, left side: Secondary | ICD-10-CM

## 2020-06-13 HISTORY — DX: Peripheral vascular disease, unspecified: I73.9

## 2020-06-13 MED ORDER — PREDNISONE 20 MG PO TABS: 20.0000 mg | ORAL_TABLET | Freq: Every day | ORAL | 0 refills | Status: AC

## 2020-06-13 NOTE — Assessment & Plan Note (Signed)
Patient is 2 weeks post Covid 19 where she was sedentary due to extreme fatigue. No redness or tenderness of lower left leg. She describes a feeling of fatigue in he left leg when walking short distances that she does not have in her R leg. I will send her for an U/S Left leg to rule out DVT and evaluate Arterial Flow.

## 2020-06-13 NOTE — Patient Instructions (Signed)
Sciatica   Sciatica is pain, weakness, tingling, or loss of feeling (numbness) along the sciatic nerve. The sciatic nerve starts in the lower back and goes down the back of each leg. Sciatica usually goes away on its own or with treatment. Sometimes, sciatica may come back (recur).  What are the causes? This condition happens when the sciatic nerve is pinched or has pressure put on it. This may be the result of:  A disk in between the bones of the spine bulging out too far (herniated disk).  Changes in the spinal disks that occur with aging.  A condition that affects a muscle in the butt.  Extra bone growth near the sciatic nerve.  A break (fracture) of the area between your hip bones (pelvis).  Pregnancy.  Tumor. This is rare.  What increases the risk? You are more likely to develop this condition if you:  Play sports that put pressure or stress on the spine.  Have poor strength and ease of movement (flexibility).  Have had a back injury in the past.  Have had back surgery.  Sit for long periods of time.  Do activities that involve bending or lifting over and over again.  Are very overweight (obese).  What are the signs or symptoms? Symptoms can vary from mild to very bad. They may include: 1. Any of these problems in the lower back, leg, hip, or butt: ? Mild tingling, loss of feeling, or dull aches. ? Burning sensations. ? Sharp pains. 2. Loss of feeling in the back of the calf or the sole of the foot. 3. Leg weakness. 4. Very bad back pain that makes it hard to move. These symptoms may get worse when you cough, sneeze, or laugh. They may also get worse when you sit or stand for long periods of time.  How is this treated? This condition often gets better without any treatment. However, treatment may include: 1. Changing or cutting back on physical activity when you have pain. 2. Doing exercises and stretching. 3. Putting ice or heat on the affected  area. 4. Medicines that help: ? To relieve pain and swelling. ? To relax your muscles. 5. Shots (injections) of medicines that help to relieve pain, irritation, and swelling. 6. Surgery.  Follow these instructions at home: Medicines 1. Take over-the-counter and prescription medicines only as told by your doctor. 2. Ask your doctor if the medicine prescribed to you: 1. Requires you to avoid driving or using heavy machinery. 2. Can cause trouble pooping (constipation). You may need to take these steps to prevent or treat trouble pooping:  Drink enough fluids to keep your pee (urine) pale yellow.  Take over-the-counter or prescription medicines.  Eat foods that are high in fiber. These include beans, whole grains, and fresh fruits and vegetables.  Limit foods that are high in fat and sugar. These include fried or sweet foods.  Managing pain     1. If told, put ice on the affected area. ? Put ice in a plastic bag. ? Place a towel between your skin and the bag. ? Leave the ice on for 20 minutes, 2-3 times a day. 2. If told, put heat on the affected area. Use the heat source that your doctor tells you to use, such as a moist heat pack or a heating pad. ? Place a towel between your skin and the heat source. ? Leave the heat on for 20-30 minutes. ? Remove the heat if your skin turns bright red.  This is very important if you are unable to feel pain, heat, or cold. You may have a greater risk of getting burned.  Activity  1. Return to your normal activities as told by your doctor. Ask your doctor what activities are safe for you. 2. Avoid activities that make your symptoms worse. 3. Take short rests during the day. ? When you rest for a long time, do some physical activity or stretching between periods of rest. ? Avoid sitting for a long time without moving. Get up and move around at least one time each hour. 4. Exercise and stretch regularly, as told by your doctor. 5. Do not lift  anything that is heavier than 10 lb (4.5 kg) while you have symptoms of sciatica. ? Avoid lifting heavy things even when you do not have symptoms. ? Avoid lifting heavy things over and over. 6. When you lift objects, always lift in a way that is safe for your body. To do this, you should: ? Bend your knees. ? Keep the object close to your body. ? Avoid twisting.  General instructions  Stay at a healthy weight.  Wear comfortable shoes that support your feet. Avoid wearing high heels.  Avoid sleeping on a mattress that is too soft or too hard. You might have less pain if you sleep on a mattress that is firm enough to support your back.  Keep all follow-up visits as told by your doctor. This is important.  Contact a doctor if: 1. You have pain that: ? Wakes you up when you are sleeping. ? Gets worse when you lie down. ? Is worse than the pain you have had in the past. ? Lasts longer than 4 weeks. 2. You lose weight without trying.  Get help right away if: 1. You cannot control when you pee (urinate) or poop (have a bowel movement). 2. You have weakness in any of these areas and it gets worse: ? Lower back. ? The area between your hip bones. ? Butt. ? Legs. 3. You have redness or swelling of your back. 4. You have a burning feeling when you pee.  Summary  Sciatica is pain, weakness, tingling, or loss of feeling (numbness) along the sciatic nerve.  This condition happens when the sciatic nerve is pinched or has pressure put on it.  Sciatica can cause pain, tingling, or loss of feeling (numbness) in the lower back, legs, hips, and butt.  Treatment often includes rest, exercise, medicines, and putting ice or heat on the affected area.  This information is not intended to replace advice given to you by your health care provider. Make sure you discuss any questions you have with your health care provider. Document Revised: 09/04/2018 Document Reviewed: 09/04/2018 Elsevier  Patient Education  [place].

## 2020-06-13 NOTE — Progress Notes (Signed)
Established Patient Office Visit  SUBJECTIVE:  Subjective  Patient ID: Amanda Cruz, female    DOB: [birth year]  Age: 74 y.o. MRN: [id number]  CC:  Chief Complaint  Patient presents with  . Leg Pain    Patient complains of left leg pain x1 week, pt reports her leg feels heavy, the pain is described as sharp, and difficulty bearing weight.     HPI Amanda Cruz is a 75 y.o. female presenting today for an evaluation of her leg pain.  She notes that she has been having left leg pain from her low back to left buttocks, down the thigh, calf, and to her ankle. She said her leg feels heavy and it hurts to put weight on her leg.   She was recently diagnosed with COVID19 and underwent a monoclonal infusion on 05/30/2020.    Past Medical History:  Diagnosis Date  . GERD (gastroesophageal reflux disease)   . Hypertension   . Wears dentures    full upper and lower    Past Surgical History:  Procedure Laterality Date  . CATARACT EXTRACTION W/PHACO Left 08/22/2019   Procedure: CATARACT EXTRACTION PHACO AND INTRAOCULAR LENS PLACEMENT (IOC) LEFT  9.98  01:10.4  14.2%;  Surgeon: Leandrew Koyanagi, MD;  Location: [place];  Service: Ophthalmology;  Laterality: Left;  . CATARACT EXTRACTION W/PHACO Right 09/19/2019   Procedure: CATARACT EXTRACTION PHACO AND INTRAOCULAR LENS PLACEMENT (IOC) RIGHT PANOPTIX LENS 7.44  00:52.4  14.3%;  Surgeon: Leandrew Koyanagi, MD;  Location: [place];  Service: Ophthalmology;  Laterality: Right;  . COLONOSCOPY WITH PROPOFOL N/A 05/09/2018   Procedure: COLONOSCOPY WITH PROPOFOL;  Surgeon: [name], MD;  Location: [facility] ENDOSCOPY;  Service: Endoscopy;  Laterality: N/A;  . ESOPHAGOGASTRODUODENOSCOPY (EGD) WITH PROPOFOL N/A 02/05/2020   Procedure: ESOPHAGOGASTRODUODENOSCOPY (EGD) WITH PROPOFOL;  Surgeon: [name], MD;  Location: [facility] ENDOSCOPY;  Service: Endoscopy;  Laterality: N/A;  . EYE SURGERY    . TEAR DUCT PROBING   03/10/2015  . TUBAL LIGATION      Family History  Problem Relation Age of Onset  . Stroke Mother   . Heart failure Mother   . Thyroid disease Paternal Grandfather     Social History   Socioeconomic History  . Marital status: Married    Spouse name: Not on file  . Number of children: Not on file  . Years of education: Not on file  . Highest education level: Not on file  Occupational History  . Not on file  Tobacco Use  . Smoking status: Never Smoker  . Smokeless tobacco: Never Used  Vaping Use  . Vaping Use: Never used  Substance and Sexual Activity  . Alcohol use: Not Currently    Alcohol/week: 1.0 standard drink    Types: 1 Glasses of wine per week  . Drug use: No  . Sexual activity: Not Currently    Birth control/protection: Post-menopausal  Other Topics Concern  . Not on file  Social History Narrative  . Not on file   Social Determinants of Health   Financial Resource Strain:   . Difficulty of Paying Living Expenses: Not on file  Food Insecurity:   . Worried About Charity fundraiser in the Last Year: Not on file  . Ran Out of Food in the Last Year: Not on file  Transportation Needs:   . Lack of Transportation (Medical): Not on file  . Lack of Transportation (Non-Medical): Not on file  Physical Activity:   . Days  of Exercise per Week: Not on file  . Minutes of Exercise per Session: Not on file  Stress:   . Feeling of Stress : Not on file  Social Connections:   . Frequency of Communication with Friends and Family: Not on file  . Frequency of Social Gatherings with Friends and Family: Not on file  . Attends Religious Services: Not on file  . Active Member of Clubs or Organizations: Not on file  . Attends Archivist Meetings: Not on file  . Marital Status: Not on file  Intimate Partner Violence:   . Fear of Current or Ex-Partner: Not on file  . Emotionally Abused: Not on file  . Physically Abused: Not on file  . Sexually Abused: Not on file      Current Outpatient Medications:  .  ASPIRIN 81 PO, Take by mouth every other day., Disp: , Rfl:  .  atorvastatin (LIPITOR) 40 MG tablet, Take 1 tablet by mouth once daily, Disp: 90 tablet, Rfl: 0 .  Biotin w/ Vitamins C & E (HAIR/SKIN/NAILS PO), Take by mouth daily., Disp: , Rfl:  .  Cholecalciferol (VITAMIN D3) 25 MCG (1000 UT) CAPS, Take by mouth daily., Disp: , Rfl:  .  fexofenadine (ALLEGRA) 180 MG tablet, Take 180 mg by mouth as needed. , Disp: , Rfl:  .  metoprolol succinate (TOPROL-XL) 50 MG 24 hr tablet, Take 50 mg by mouth., Disp: , Rfl:  .  omeprazole (PRILOSEC) 40 MG capsule, Take 40 mg by mouth daily., Disp: , Rfl:  .  predniSONE (DELTASONE) 20 MG tablet, Take 1 tablet (20 mg total) by mouth daily with breakfast for 10 days., Disp: 10 tablet, Rfl: 0 .  telmisartan-hydrochlorothiazide (MICARDIS HCT) 80-12.5 MG tablet, Take 1 tablet by mouth daily., Disp: , Rfl:    No Known Allergies  ROS Review of Systems  Constitutional: Positive for fatigue.  HENT: Negative.   Eyes: Negative.   Respiratory: Negative.   Cardiovascular: Negative.  Negative for leg swelling.  Gastrointestinal: Negative.   Endocrine: Negative.   Genitourinary: Negative.   Musculoskeletal: Positive for back pain (lower). Negative for neck pain.       + Left leg pain  Skin: Negative.   Allergic/Immunologic: Negative.   Neurological: Positive for weakness (general).  Hematological: Negative.   Psychiatric/Behavioral: Negative.   All other systems reviewed and are negative.    OBJECTIVE:    Physical Exam Vitals reviewed.  Constitutional:      Appearance: Normal appearance.  HENT:     Mouth/Throat:     Mouth: Mucous membranes are moist.  Eyes:     Pupils: Pupils are equal, round, and reactive to light.  Neck:     Vascular: No carotid bruit.  Cardiovascular:     Rate and Rhythm: Normal rate and regular rhythm.     Pulses: Normal pulses.     Heart sounds: Normal heart sounds.  Pulmonary:      Effort: Pulmonary effort is normal.     Breath sounds: Normal breath sounds.  Abdominal:     General: Bowel sounds are normal.     Palpations: Abdomen is soft. There is no hepatomegaly, splenomegaly or mass.     Tenderness: There is no abdominal tenderness.     Hernia: No hernia is present.  Musculoskeletal:        General: No tenderness.     Cervical back: Neck supple.     Right lower leg: No edema.     Left lower leg:  No edema.     Comments: Positive straight leg raise left side.   Skin:    Findings: No rash.  Neurological:     Mental Status: She is alert and oriented to person, place, and time.     Motor: No weakness.  Psychiatric:        Mood and Affect: Mood and affect normal.        Behavior: Behavior normal.     BP (!) 158/92   Pulse 86   Ht '5\' 3"'  (1.6 m)   Wt 159 lb 12.8 oz (72.5 kg)   BMI 28.31 kg/m  Wt Readings from Last 3 Encounters:  06/13/20 159 lb 12.8 oz (72.5 kg)  05/29/20 163 lb 6 oz (74.1 kg)  04/17/20 163 lb 6.4 oz (74.1 kg)    Health Maintenance Due  Topic Date Due  . Hepatitis C Screening  Never done  . TETANUS/TDAP  Never done  . PNA vac Low Risk Adult (1 of 2 - PCV13) Never done  . COVID-19 Vaccine (2 - Pfizer 2-dose series) 11/14/2019  . INFLUENZA VACCINE  03/30/2020    There are no preventive care reminders to display for this patient.  CBC Latest Ref Rng & Units 10/16/2015  WBC 3.4 - 10.8 x10E3/uL 4.0  Hemoglobin 11.1 - 15.9 g/dL 13.0  Hematocrit 34.0 - 46.6 % 37.6  Platelets 150 - 379 x10E3/uL 189   No flowsheet data found.  Lab Results  Component Value Date   TSH 1.520 10/16/2015   No results found for: ALBUMIN, ANIONGAP, EGFR, GFR No results found for: CHOL, HDL, LDLCALC, CHOLHDL No results found for: TRIG No results found for: HGBA1C    ASSESSMENT & PLAN:   Problem List Items Addressed This Visit      Cardiovascular and Mediastinum   Essential (primary) hypertension    Not at goal will re evaluate upon fu of [place] left  leg.         Nervous and Auditory   Sciatica of left side    Pain left leg with weakness when she walks, pain starts left buttocks and radiates down her left leg. Pos SLR sign.         Other   Claudication [facility]) - Primary    Patient is 2 weeks post Covid 19 where she was sedentary due to extreme fatigue. No redness or tenderness of lower left leg. She describes a feeling of fatigue in he left leg when walking short distances that she does not have in her R leg. I will send her for an U/S Left leg to rule out DVT and evaluate Arterial Flow.       Relevant Medications   predniSONE (DELTASONE) 20 MG tablet    Other Visit Diagnoses    Pain of left lower extremity       Relevant Orders   US Venous Img Lower Unilateral Left      Meds ordered this encounter  Medications  . predniSONE (DELTASONE) 20 MG tablet    Sig: Take 1 tablet (20 mg total) by mouth daily with breakfast for 10 days.    Dispense:  10 tablet    Refill:  0    Follow-up: No follow-ups on file.    Beckie Salts, [place] [address] [place], [place] [address]   By signing my name below, I, [employer], attest that this documentation has been prepared under the direction and in the presence of Beckie Salts, FNP Electronically Signed:  Beckie Salts, [place] 06/13/20, 3:34 PM I personally performed the services described in this documentation, which was SCRIBED in my presence. The recorded information has been reviewed and considered accurate. It has been edited as necessary during review. Beckie Salts, FNP

## 2020-06-13 NOTE — Assessment & Plan Note (Signed)
Pain left leg with weakness when she walks, pain starts left buttocks and radiates down her left leg. Pos SLR sign.

## 2020-06-13 NOTE — Assessment & Plan Note (Signed)
Not at goal will re evaluate upon fu of [place] left leg.

## 2020-06-17 DIAGNOSIS — H26491 Other secondary cataract, right eye: Secondary | ICD-10-CM | POA: Diagnosis not present

## 2020-06-19 ENCOUNTER — Ambulatory Visit: Payer: PPO | Admitting: Family Medicine

## 2020-06-24 ENCOUNTER — Encounter: Payer: Self-pay | Admitting: Internal Medicine

## 2020-06-24 ENCOUNTER — Ambulatory Visit (INDEPENDENT_AMBULATORY_CARE_PROVIDER_SITE_OTHER): Payer: PPO | Admitting: Internal Medicine

## 2020-06-24 ENCOUNTER — Other Ambulatory Visit: Payer: Self-pay

## 2020-06-24 VITALS — BP 187/77 | HR 72 | Ht 63.0 in | Wt 163.6 lb

## 2020-06-24 DIAGNOSIS — I10 Essential (primary) hypertension: Secondary | ICD-10-CM | POA: Diagnosis not present

## 2020-06-24 DIAGNOSIS — U071 COVID-19: Secondary | ICD-10-CM | POA: Diagnosis not present

## 2020-06-24 DIAGNOSIS — M5432 Sciatica, left side: Secondary | ICD-10-CM

## 2020-06-24 DIAGNOSIS — I872 Venous insufficiency (chronic) (peripheral): Secondary | ICD-10-CM | POA: Diagnosis not present

## 2020-06-24 DIAGNOSIS — E782 Mixed hyperlipidemia: Secondary | ICD-10-CM

## 2020-06-24 MED ORDER — MECLIZINE HCL 12.5 MG PO TABS: 12.5000 mg | ORAL_TABLET | Freq: Three times a day (TID) | ORAL | 0 refills | Status: DC | PRN

## 2020-06-24 NOTE — Assessment & Plan Note (Signed)
-   Today, the patient's blood pressure is not well managed on present treatment. - The patient will continue the current treatment regimen. And follow low salt diet - I encouraged the patient to eat a low-sodium diet to help control blood pressure. - I encouraged the patient to live an active lifestyle and complete activities that increases heart rate to 85% target heart rate at least 5 times per week for one hour.

## 2020-06-24 NOTE — Assessment & Plan Note (Addendum)
Dermatitis is better does not have any calf tenderness, patient was advised to follow low-salt diet and use Ace bandages if needed to decrease the swelling.  Patient is recovering well from her previous Covid infection.

## 2020-06-24 NOTE — Assessment & Plan Note (Signed)
Patient is recovering well from the Covid infection her chest is clear she does not have any fever complain of dizziness.  She can recall walk without any difficulty.

## 2020-06-24 NOTE — Progress Notes (Signed)
Established Patient Office Visit  Subjective:  Patient ID: Amanda Cruz, female    DOB: 06-11-1946  Age: 74 y.o. MRN: [id number]  CC:  Chief Complaint  Patient presents with  . Dizziness    Patient complains of dizziness for the past 3 days    HPI  C/o dizziness   Past Medical History:  Diagnosis Date  . GERD (gastroesophageal reflux disease)   . Hypertension   . Wears dentures    full upper and lower    Past Surgical History:  Procedure Laterality Date  . CATARACT EXTRACTION W/PHACO Left 08/22/2019   Procedure: CATARACT EXTRACTION PHACO AND INTRAOCULAR LENS PLACEMENT (IOC) LEFT  9.98  01:10.4  14.2%;  Surgeon: Leandrew Koyanagi, MD;  Location: [place];  Service: Ophthalmology;  Laterality: Left;  . CATARACT EXTRACTION W/PHACO Right 09/19/2019   Procedure: CATARACT EXTRACTION PHACO AND INTRAOCULAR LENS PLACEMENT (IOC) RIGHT PANOPTIX LENS 7.44  00:52.4  14.3%;  Surgeon: Leandrew Koyanagi, MD;  Location: [place];  Service: Ophthalmology;  Laterality: Right;  . COLONOSCOPY WITH PROPOFOL N/A 05/09/2018   Procedure: COLONOSCOPY WITH PROPOFOL;  Surgeon: [name], MD;  Location: [facility] ENDOSCOPY;  Service: Endoscopy;  Laterality: N/A;  . ESOPHAGOGASTRODUODENOSCOPY (EGD) WITH PROPOFOL N/A 02/05/2020   Procedure: ESOPHAGOGASTRODUODENOSCOPY (EGD) WITH PROPOFOL;  Surgeon: [name], MD;  Location: [facility] ENDOSCOPY;  Service: Endoscopy;  Laterality: N/A;  . EYE SURGERY    . TEAR DUCT PROBING  03/10/2015  . TUBAL LIGATION      Family History  Problem Relation Age of Onset  . Stroke Mother   . Heart failure Mother   . Thyroid disease Paternal Grandfather     Social History   Socioeconomic History  . Marital status: Married    Spouse name: Not on file  . Number of children: Not on file  . Years of education: Not on file  . Highest education level: Not on file  Occupational History  . Not on file  Tobacco Use  . Smoking status: Never Smoker    . Smokeless tobacco: Never Used  Vaping Use  . Vaping Use: Never used  Substance and Sexual Activity  . Alcohol use: Not Currently    Alcohol/week: 1.0 standard drink    Types: 1 Glasses of wine per week  . Drug use: No  . Sexual activity: Not Currently    Birth control/protection: Post-menopausal  Other Topics Concern  . Not on file  Social History Narrative  . Not on file   Social Determinants of Health   Financial Resource Strain:   . Difficulty of Paying Living Expenses: Not on file  Food Insecurity:   . Worried About Charity fundraiser in the Last Year: Not on file  . Ran Out of Food in the Last Year: Not on file  Transportation Needs:   . Lack of Transportation (Medical): Not on file  . Lack of Transportation (Non-Medical): Not on file  Physical Activity:   . Days of Exercise per Week: Not on file  . Minutes of Exercise per Session: Not on file  Stress:   . Feeling of Stress : Not on file  Social Connections:   . Frequency of Communication with Friends and Family: Not on file  . Frequency of Social Gatherings with Friends and Family: Not on file  . Attends Religious Services: Not on file  . Active Member of Clubs or Organizations: Not on file  . Attends Archivist Meetings: Not on file  .  Marital Status: Not on file  Intimate Partner Violence:   . Fear of Current or Ex-Partner: Not on file  . Emotionally Abused: Not on file  . Physically Abused: Not on file  . Sexually Abused: Not on file     Current Outpatient Medications:  .  ASPIRIN 81 PO, Take by mouth every other day., Disp: , Rfl:  .  atorvastatin (LIPITOR) 40 MG tablet, Take 1 tablet by mouth once daily, Disp: 90 tablet, Rfl: 0 .  Biotin w/ Vitamins C & E (HAIR/SKIN/NAILS PO), Take by mouth daily., Disp: , Rfl:  .  Cholecalciferol (VITAMIN D3) 25 MCG (1000 UT) CAPS, Take by mouth daily., Disp: , Rfl:  .  fexofenadine (ALLEGRA) 180 MG tablet, Take 180 mg by mouth as needed. , Disp: , Rfl:  .   meclizine (ANTIVERT) 12.5 MG tablet, Take 1 tablet (12.5 mg total) by mouth 3 (three) times daily as needed for dizziness., Disp: 30 tablet, Rfl: 0 .  metoprolol succinate (TOPROL-XL) 50 MG 24 hr tablet, Take 50 mg by mouth., Disp: , Rfl:  .  omeprazole (PRILOSEC) 40 MG capsule, Take 40 mg by mouth daily., Disp: , Rfl:  .  telmisartan-hydrochlorothiazide (MICARDIS HCT) 80-12.5 MG tablet, Take 1 tablet by mouth daily., Disp: , Rfl:    No Known Allergies  ROS Review of Systems  Constitutional: Negative for fatigue.  HENT: Positive for ear pain. Negative for sneezing.   Eyes: Negative for pain.  Respiratory: Negative for choking.   Cardiovascular: Negative for leg swelling.  Gastrointestinal: Negative for diarrhea.  Endocrine: Negative for cold intolerance and heat intolerance.  Genitourinary: Negative for difficulty urinating.  Musculoskeletal: Positive for arthralgias and back pain.      Objective:    Physical Exam Vitals reviewed.  Constitutional:      Appearance: Normal appearance.  HENT:     Mouth/Throat:     Mouth: Mucous membranes are moist.  Eyes:     Pupils: Pupils are equal, round, and reactive to light.  Neck:     Vascular: No carotid bruit.  Cardiovascular:     Rate and Rhythm: Normal rate and regular rhythm.     Pulses: Normal pulses.     Heart sounds: Normal heart sounds.  Pulmonary:     Effort: Pulmonary effort is normal.     Breath sounds: Normal breath sounds.  Abdominal:     General: Bowel sounds are normal.     Palpations: Abdomen is soft. There is no hepatomegaly, splenomegaly or mass.     Tenderness: There is no abdominal tenderness.     Hernia: No hernia is present.  Musculoskeletal:        General: No tenderness.     Cervical back: Neck supple.     Right lower leg: No edema.     Left lower leg: No edema.  Skin:    Findings: No rash.  Neurological:     Mental Status: She is alert and oriented to person, place, and time.     Motor: No  weakness.  Psychiatric:        Mood and Affect: Mood and affect normal.        Behavior: Behavior normal.     BP (!) 187/77   Pulse 72   Ht _0  (1.6 m)   Wt 163 lb 9.6 oz (74.2 kg)   BMI 28.98 kg/m  Wt Readings from Last 3 Encounters:  06/24/20 163 lb 9.6 oz (74.2 kg)  06/13/20 159 lb 12.8  oz (72.5 kg)  05/29/20 163 lb 6 oz (74.1 kg)     Health Maintenance Due  Topic Date Due  . Hepatitis C Screening  Never done  . TETANUS/TDAP  Never done  . PNA vac Low Risk Adult (1 of 2 - PCV13) Never done  . COVID-19 Vaccine (2 - Pfizer 2-dose series) 11/14/2019  . INFLUENZA VACCINE  03/30/2020    There are no preventive care reminders to display for this patient.  Lab Results  Component Value Date   TSH 1.520 10/16/2015   Lab Results  Component Value Date   WBC 4.0 10/16/2015   HGB 13.0 10/16/2015   HCT 37.6 10/16/2015   MCV 87 10/16/2015   PLT 189 10/16/2015   No results found for: NA, K, CHLORIDE, CO2, GLUCOSE, BUN, CREATININE, BILITOT, ALKPHOS, AST, ALT, PROT, ALBUMIN, CALCIUM, ANIONGAP, EGFR, GFR No results found for: CHOL No results found for: HDL No results found for: LDLCALC No results found for: TRIG No results found for: CHOLHDL No results found for: HGBA1C    Assessment & Plan:   Problem List Items Addressed This Visit      Cardiovascular and Mediastinum   Essential hypertension - Primary    - Today, the patient's blood pressure is not well managed on present treatment. - The patient will continue the current treatment regimen. And follow low salt diet - I encouraged the patient to eat a low-sodium diet to help control blood pressure. - I encouraged the patient to live an active lifestyle and complete activities that increases heart rate to 85% target heart rate at least 5 times per week for one hour.          Relevant Medications   meclizine (ANTIVERT) 12.5 MG tablet   Chronic venous insufficiency    Dermatitis is better does not have any calf  tenderness, patient was advised to follow low-salt diet and use Ace bandages if needed to decrease the swelling.  Patient is recovering well from her previous Covid infection.        Nervous and Auditory   Sciatica of left side    Patient has a sciatica with the pain going down to the left leg.  It is a chronic problem.  But under control with over-the-counter pain medication.  Patient was advised to continue walking and stay active.        Other   Hyperlipidemia    - The patient's hyperlipidemia is stable on statin. - The patient will continue the current treatment regimen.  - I encouraged the patient to eat more vegetables and whole wheat, and to avoid fatty foods like whole milk, hard cheese, egg yolks, margarine, baked sweets, and fried foods.  - I encouraged the patient to live an active lifestyle and complete activities for 40 minutes at least three times per week.  - I instructed the patient to go to the ER if they begin having chest pain.        COVID-19 virus infection    Patient is recovering well from the Covid infection her chest is clear she does not have any fever complain of dizziness.  She can recall walk without any difficulty.         Meds ordered this encounter  Medications  . meclizine (ANTIVERT) 12.5 MG tablet    Sig: Take 1 tablet (12.5 mg total) by mouth 3 (three) times daily as needed for dizziness.    Dispense:  30 tablet    Refill:  0  Follow-up: No follow-ups on file.    Yonael Tulloch, MD 

## 2020-06-24 NOTE — Assessment & Plan Note (Signed)

## 2020-06-24 NOTE — Assessment & Plan Note (Signed)
Patient has a sciatica with the pain going down to the left leg.  It is a chronic problem.  But under control with over-the-counter pain medication.  Patient was advised to continue walking and stay active.

## 2020-07-01 ENCOUNTER — Other Ambulatory Visit: Payer: Self-pay

## 2020-07-01 ENCOUNTER — Ambulatory Visit (INDEPENDENT_AMBULATORY_CARE_PROVIDER_SITE_OTHER): Payer: PPO | Admitting: Internal Medicine

## 2020-07-01 ENCOUNTER — Encounter: Payer: Self-pay | Admitting: Internal Medicine

## 2020-07-01 VITALS — BP 140/80 | HR 72 | Ht 65.0 in | Wt 164.7 lb

## 2020-07-01 DIAGNOSIS — E782 Mixed hyperlipidemia: Secondary | ICD-10-CM | POA: Diagnosis not present

## 2020-07-01 DIAGNOSIS — Z23 Encounter for immunization: Secondary | ICD-10-CM

## 2020-07-01 DIAGNOSIS — I83893 Varicose veins of bilateral lower extremities with other complications: Secondary | ICD-10-CM

## 2020-07-01 DIAGNOSIS — I10 Essential (primary) hypertension: Secondary | ICD-10-CM | POA: Diagnosis not present

## 2020-07-01 DIAGNOSIS — R42 Dizziness and giddiness: Secondary | ICD-10-CM | POA: Insufficient documentation

## 2020-07-01 DIAGNOSIS — M5432 Sciatica, left side: Secondary | ICD-10-CM | POA: Diagnosis not present

## 2020-07-01 NOTE — Assessment & Plan Note (Signed)
Patient was advised on the diet and she was advised to continue taking her Lipitor 40 mg tablet 1 a day.

## 2020-07-01 NOTE — Assessment & Plan Note (Addendum)
Complain of pain in the back going to the left side consistent with sciatica

## 2020-07-01 NOTE — Progress Notes (Signed)
Established Patient Office Visit  Subjective:  Patient ID: Amanda Cruz, female    DOB: [birth year]  Age: 74 y.o. MRN: [id number]  CC:  Chief Complaint  Patient presents with  . Hypertension    BP still elevated     Hypertension This is a chronic problem. The current episode started more than 1 year ago. The problem has been gradually improving since onset. The problem is uncontrolled. Associated symptoms include malaise/fatigue. Pertinent negatives include no anxiety, blurred vision, chest pain, headaches, orthopnea, palpitations, peripheral edema, PND, shortness of breath or sweats. Risk factors for coronary artery disease include obesity and post-menopausal state. There is no history of kidney disease, CAD/MI, CVA, heart failure or left ventricular hypertrophy. There is no history of sleep apnea or a thyroid problem.    Amanda Cruz presents for dissiness and bp check  Past Medical History:  Diagnosis Date  . GERD (gastroesophageal reflux disease)   . Hypertension   . Wears dentures    full upper and lower    Past Surgical History:  Procedure Laterality Date  . CATARACT EXTRACTION W/PHACO Left 08/22/2019   Procedure: CATARACT EXTRACTION PHACO AND INTRAOCULAR LENS PLACEMENT (IOC) LEFT  9.98  01:10.4  14.2%;  Surgeon: Leandrew Koyanagi, MD;  Location: [place];  Service: Ophthalmology;  Laterality: Left;  . CATARACT EXTRACTION W/PHACO Right 09/19/2019   Procedure: CATARACT EXTRACTION PHACO AND INTRAOCULAR LENS PLACEMENT (IOC) RIGHT PANOPTIX LENS 7.44  00:52.4  14.3%;  Surgeon: Leandrew Koyanagi, MD;  Location: [place];  Service: Ophthalmology;  Laterality: Right;  . COLONOSCOPY WITH PROPOFOL N/A 05/09/2018   Procedure: COLONOSCOPY WITH PROPOFOL;  Surgeon: [name], MD;  Location: [facility] ENDOSCOPY;  Service: Endoscopy;  Laterality: N/A;  . ESOPHAGOGASTRODUODENOSCOPY (EGD) WITH PROPOFOL N/A 02/05/2020   Procedure: ESOPHAGOGASTRODUODENOSCOPY (EGD)  WITH PROPOFOL;  Surgeon: [name], MD;  Location: [facility] ENDOSCOPY;  Service: Endoscopy;  Laterality: N/A;  . EYE SURGERY    . TEAR DUCT PROBING  03/10/2015  . TUBAL LIGATION      Family History  Problem Relation Age of Onset  . Stroke Mother   . Heart failure Mother   . Thyroid disease Paternal Grandfather     Social History   Socioeconomic History  . Marital status: Married    Spouse name: Not on file  . Number of children: Not on file  . Years of education: Not on file  . Highest education level: Not on file  Occupational History  . Not on file  Tobacco Use  . Smoking status: Never Smoker  . Smokeless tobacco: Never Used  Vaping Use  . Vaping Use: Never used  Substance and Sexual Activity  . Alcohol use: Not Currently    Alcohol/week: 1.0 standard drink    Types: 1 Glasses of wine per week  . Drug use: No  . Sexual activity: Not Currently    Birth control/protection: Post-menopausal  Other Topics Concern  . Not on file  Social History Narrative  . Not on file   Social Determinants of Health   Financial Resource Strain:   . Difficulty of Paying Living Expenses: Not on file  Food Insecurity:   . Worried About Charity fundraiser in the Last Year: Not on file  . Ran Out of Food in the Last Year: Not on file  Transportation Needs:   . Lack of Transportation (Medical): Not on file  . Lack of Transportation (Non-Medical): Not on file  Physical Activity:   . Days  of Exercise per Week: Not on file  . Minutes of Exercise per Session: Not on file  Stress:   . Feeling of Stress : Not on file  Social Connections:   . Frequency of Communication with Friends and Family: Not on file  . Frequency of Social Gatherings with Friends and Family: Not on file  . Attends Religious Services: Not on file  . Active Member of Clubs or Organizations: Not on file  . Attends Archivist Meetings: Not on file  . Marital Status: Not on file  Intimate Partner Violence:   .  Fear of Current or Ex-Partner: Not on file  . Emotionally Abused: Not on file  . Physically Abused: Not on file  . Sexually Abused: Not on file     Current Outpatient Medications:  .  ASPIRIN 81 PO, Take by mouth every other day., Disp: , Rfl:  .  atorvastatin (LIPITOR) 40 MG tablet, Take 1 tablet by mouth once daily, Disp: 90 tablet, Rfl: 0 .  Biotin w/ Vitamins C & E (HAIR/SKIN/NAILS PO), Take by mouth daily., Disp: , Rfl:  .  Cholecalciferol (VITAMIN D3) 25 MCG (1000 UT) CAPS, Take by mouth daily., Disp: , Rfl:  .  fexofenadine (ALLEGRA) 180 MG tablet, Take 180 mg by mouth as needed. , Disp: , Rfl:  .  meclizine (ANTIVERT) 12.5 MG tablet, Take 1 tablet (12.5 mg total) by mouth 3 (three) times daily as needed for dizziness., Disp: 30 tablet, Rfl: 0 .  metoprolol succinate (TOPROL-XL) 50 MG 24 hr tablet, Take 50 mg by mouth., Disp: , Rfl:  .  omeprazole (PRILOSEC) 40 MG capsule, Take 40 mg by mouth daily., Disp: , Rfl:  .  telmisartan-hydrochlorothiazide (MICARDIS HCT) 80-12.5 MG tablet, Take 1 tablet by mouth daily., Disp: , Rfl:    No Known Allergies  ROS Review of Systems  Constitutional: Positive for malaise/fatigue.  Eyes: Negative for blurred vision.  Respiratory: Negative for shortness of breath.   Cardiovascular: Negative for chest pain, palpitations, orthopnea and PND.  Neurological: Negative for headaches.      Objective:    Physical Exam  BP 140/80   Pulse 72   Ht '5\' 5"'  (1.651 m)   Wt 164 lb 11.2 oz (74.7 kg)   BMI 27.41 kg/m  Wt Readings from Last 3 Encounters:  07/01/20 164 lb 11.2 oz (74.7 kg)  06/24/20 163 lb 9.6 oz (74.2 kg)  06/13/20 159 lb 12.8 oz (72.5 kg)     Health Maintenance Due  Topic Date Due  . Hepatitis C Screening  Never done  . TETANUS/TDAP  Never done  . PNA vac Low Risk Adult (1 of 2 - PCV13) Never done  . COVID-19 Vaccine (2 - Pfizer 2-dose series) 11/14/2019  . INFLUENZA VACCINE  03/30/2020    There are no preventive care  reminders to display for this patient.  Lab Results  Component Value Date   TSH 1.520 10/16/2015   Lab Results  Component Value Date   WBC 4.0 10/16/2015   HGB 13.0 10/16/2015   HCT 37.6 10/16/2015   MCV 87 10/16/2015   PLT 189 10/16/2015   No results found for: NA, K, CHLORIDE, CO2, GLUCOSE, BUN, CREATININE, BILITOT, ALKPHOS, AST, ALT, PROT, ALBUMIN, CALCIUM, ANIONGAP, EGFR, GFR No results found for: CHOL No results found for: HDL No results found for: LDLCALC No results found for: TRIG No results found for: CHOLHDL No results found for: HGBA1C    Assessment & Plan:  Problem List Items Addressed This Visit      Cardiovascular and Mediastinum   Essential hypertension - Primary    Blood pressure is under control on all and beta-blocker.  Patient was advised to continue taking same medications.      Varicose veins of bilateral lower extremities with other complications    Patient has varicose veins in both legs with some dermatitis.  Which is stable at the present time.        Nervous and Auditory   Sciatica of left side    Complain of pain in the back going to the left side consistent with sciatica        Other   Hyperlipidemia    Patient was advised on the diet and she was advised to continue taking her Lipitor 40 mg tablet 1 a day.      Dizziness    Her dizziness is much better.  I think it is vasovagal probably also contributing to a lot of fluid.         No orders of the defined types were placed in this encounter.   Follow-up: No follow-ups on file.    [name], MD

## 2020-07-01 NOTE — Assessment & Plan Note (Addendum)
Her dizziness is much better.  I think it is vasovagal patient was advised to drink a lot of water

## 2020-07-01 NOTE — Addendum Note (Signed)
Addended by: Anson [place] R on: 07/01/2020 03:57 PM   Modules accepted: Orders

## 2020-07-01 NOTE — Assessment & Plan Note (Signed)
Patient has varicose veins in both legs with some dermatitis.  Which is stable at the present time.

## 2020-07-01 NOTE — Assessment & Plan Note (Signed)
Blood pressure is under control on all and beta-blocker.  Patient was advised to continue taking same medications.

## 2020-07-15 DIAGNOSIS — H26492 Other secondary cataract, left eye: Secondary | ICD-10-CM | POA: Diagnosis not present

## 2020-08-08 ENCOUNTER — Ambulatory Visit (INDEPENDENT_AMBULATORY_CARE_PROVIDER_SITE_OTHER): Payer: PPO | Admitting: Family Medicine

## 2020-08-08 ENCOUNTER — Encounter: Payer: Self-pay | Admitting: Family Medicine

## 2020-08-08 ENCOUNTER — Other Ambulatory Visit: Payer: Self-pay

## 2020-08-08 VITALS — BP 134/71 | HR 70 | Ht 63.0 in | Wt 165.7 lb

## 2020-08-08 DIAGNOSIS — M5432 Sciatica, left side: Secondary | ICD-10-CM

## 2020-08-08 DIAGNOSIS — I10 Essential (primary) hypertension: Secondary | ICD-10-CM

## 2020-08-08 DIAGNOSIS — Z0000 Encounter for general adult medical examination without abnormal findings: Secondary | ICD-10-CM

## 2020-08-08 DIAGNOSIS — N3281 Overactive bladder: Secondary | ICD-10-CM | POA: Diagnosis not present

## 2020-08-08 MED ORDER — MIRABEGRON ER 25 MG PO TB24: 25.0000 mg | ORAL_TABLET | Freq: Every day | ORAL | Status: DC

## 2020-08-08 NOTE — Assessment & Plan Note (Signed)
Colon Screening- Mammogram- N/a  Pap- N/a TDAP- Does not recall Shingles Vaccine- Unsure Flu Vaccine- 2021 Pneumonia Vaccine- Unsure

## 2020-08-08 NOTE — Progress Notes (Signed)
Established Patient Office Visit  SUBJECTIVE:  Subjective  Patient ID: Amanda Cruz, female    DOB: 10/05/1945  Age: 74 y.o. MRN: [id number]  CC:  Chief Complaint  Patient presents with  . Annual Exam    HPI Amanda Cruz is a 74 y.o. female presenting today for     Past Medical History:  Diagnosis Date  . GERD (gastroesophageal reflux disease)   . Hypertension   . Wears dentures    full upper and lower    Past Surgical History:  Procedure Laterality Date  . CATARACT EXTRACTION W/PHACO Left 08/22/2019   Procedure: CATARACT EXTRACTION PHACO AND INTRAOCULAR LENS PLACEMENT (IOC) LEFT  9.98  01:10.4  14.2%;  Surgeon: Leandrew Koyanagi, MD;  Location: [place];  Service: Ophthalmology;  Laterality: Left;  . CATARACT EXTRACTION W/PHACO Right 09/19/2019   Procedure: CATARACT EXTRACTION PHACO AND INTRAOCULAR LENS PLACEMENT (IOC) RIGHT PANOPTIX LENS 7.44  00:52.4  14.3%;  Surgeon: Leandrew Koyanagi, MD;  Location: [place];  Service: Ophthalmology;  Laterality: Right;  . COLONOSCOPY WITH PROPOFOL N/A 05/09/2018   Procedure: COLONOSCOPY WITH PROPOFOL;  Surgeon: [name], MD;  Location: [facility] ENDOSCOPY;  Service: Endoscopy;  Laterality: N/A;  . ESOPHAGOGASTRODUODENOSCOPY (EGD) WITH PROPOFOL N/A 02/05/2020   Procedure: ESOPHAGOGASTRODUODENOSCOPY (EGD) WITH PROPOFOL;  Surgeon: [name], MD;  Location: [facility] ENDOSCOPY;  Service: Endoscopy;  Laterality: N/A;  . EYE SURGERY    . TEAR DUCT PROBING  03/10/2015  . TUBAL LIGATION      Family History  Problem Relation Age of Onset  . Stroke Mother   . Heart failure Mother   . Thyroid disease Paternal Grandfather     Social History   Socioeconomic History  . Marital status: Married    Spouse name: Not on file  . Number of children: Not on file  . Years of education: Not on file  . Highest education level: Not on file  Occupational History  . Not on file  Tobacco Use  . Smoking status: Never  Smoker  . Smokeless tobacco: Never Used  Vaping Use  . Vaping Use: Never used  Substance and Sexual Activity  . Alcohol use: Not Currently    Alcohol/week: 1.0 standard drink    Types: 1 Glasses of wine per week  . Drug use: No  . Sexual activity: Not Currently    Birth control/protection: Post-menopausal  Other Topics Concern  . Not on file  Social History Narrative  . Not on file   Social Determinants of Health   Financial Resource Strain: Not on file  Food Insecurity: Not on file  Transportation Needs: Not on file  Physical Activity: Not on file  Stress: Not on file  Social Connections: Not on file  Intimate Partner Violence: Not on file     Current Outpatient Medications:  .  ASPIRIN 81 PO, Take by mouth every other day., Disp: , Rfl:  .  atorvastatin (LIPITOR) 40 MG tablet, Take 1 tablet by mouth once daily, Disp: 90 tablet, Rfl: 0 .  Biotin w/ Vitamins C & E (HAIR/SKIN/NAILS PO), Take by mouth daily., Disp: , Rfl:  .  Cholecalciferol (VITAMIN D3) 25 MCG (1000 UT) CAPS, Take by mouth daily., Disp: , Rfl:  .  meclizine (ANTIVERT) 12.5 MG tablet, Take 1 tablet (12.5 mg total) by mouth 3 (three) times daily as needed for dizziness., Disp: 30 tablet, Rfl: 0 .  metoprolol succinate (TOPROL-XL) 50 MG 24 hr tablet, Take 50 mg by mouth., Disp: ,  Rfl:  .  telmisartan-hydrochlorothiazide (MICARDIS HCT) 80-12.5 MG tablet, Take 1 tablet by mouth daily., Disp: , Rfl:    No Known Allergies  ROS Review of Systems  Constitutional: Negative.   HENT: Negative.   Eyes: Negative.   Respiratory: Negative.   Cardiovascular: Negative.   Gastrointestinal: Negative.   Musculoskeletal: Negative.   Skin: Negative.   Neurological: Negative.   Psychiatric/Behavioral: Negative.      OBJECTIVE:    Physical Exam Constitutional:      Appearance: Normal appearance.  HENT:     Head: Normocephalic.     Right Ear: Tympanic membrane normal.     Left Ear: Tympanic membrane normal.      Mouth/Throat:     Mouth: Mucous membranes are dry.  Cardiovascular:     Rate and Rhythm: Normal rate and regular rhythm.  Musculoskeletal:        General: Normal range of motion.     Cervical back: Normal range of motion.  Skin:    General: Skin is warm.  Psychiatric:        Mood and Affect: Mood normal.     BP 134/71   Pulse 70   Ht _0  (1.6 m)   Wt 165 lb 11.2 oz (75.2 kg)   BMI 29.35 kg/m  Wt Readings from Last 3 Encounters:  08/08/20 165 lb 11.2 oz (75.2 kg)  07/01/20 164 lb 11.2 oz (74.7 kg)  06/24/20 163 lb 9.6 oz (74.2 kg)    Health Maintenance Due  Topic Date Due  . Hepatitis C Screening  Never done  . TETANUS/TDAP  Never done  . PNA vac Low Risk Adult (1 of 2 - PCV13) Never done  . COVID-19 Vaccine (3 - Booster for Pfizer series) 05/16/2020    There are no preventive care reminders to display for this patient.  CBC Latest Ref Rng & Units 10/16/2015  WBC 3.4 - 10.8 x10E3/uL 4.0  Hemoglobin 11.1 - 15.9 g/dL 13.0  Hematocrit 34.0 - 46.6 % 37.6  Platelets 150 - 379 x10E3/uL 189   No flowsheet data found.  Lab Results  Component Value Date   TSH 1.520 10/16/2015   No results found for: ALBUMIN, ANIONGAP, EGFR, GFR No results found for: CHOL, HDL, LDLCALC, CHOLHDL No results found for: TRIG No results found for: HGBA1C    ASSESSMENT & PLAN:   Problem List Items Addressed This Visit      Cardiovascular and Mediastinum   Essential hypertension    Patient's blood pressure is within the desired range. Medication side effects include: no side effects noted Continue current treatment regimen.         Nervous and Auditory   Sciatica of left side    Discussed continued sciatic pain- pos strait leg raise test pain on both sides.         Other   Annual physical exam - Primary    Colon Screening- Mammogram- N/a  Pap- N/a TDAP- Does not recall Shingles Vaccine- Unsure Flu Vaccine- 2021 Pneumonia Vaccine- Unsure            No orders of  the defined types were placed in this encounter.     Follow-up: No follow-ups on file.    Beckie Salts, [place] [address] [place], [place] [address]

## 2020-08-08 NOTE — Assessment & Plan Note (Signed)
Patient's blood pressure is within the desired range. Medication side effects include: no side effects noted Continue current treatment regimen.  

## 2020-08-08 NOTE — Assessment & Plan Note (Signed)
Discussed continued sciatic pain- pos strait leg raise test pain on both sides.

## 2020-08-09 LAB — CBC WITH DIFFERENTIAL/PLATELET
Absolute Monocytes: 412 cells/uL (ref 200–950)
Basophils Absolute: 32 cells/uL (ref 0–200)
Basophils Relative: 0.8 %
Eosinophils Absolute: 180 cells/uL (ref 15–500)
Eosinophils Relative: 4.5 %
HCT: 34.8 % — ABNORMAL LOW (ref 35.0–45.0)
Hemoglobin: 12.1 g/dL (ref 11.7–15.5)
Lymphs Abs: 1100 cells/uL (ref 850–3900)
MCH: 31.2 pg (ref 27.0–33.0)
MCHC: 34.8 g/dL (ref 32.0–36.0)
MCV: 89.7 fL (ref 80.0–100.0)
MPV: 9 fL (ref 7.5–12.5)
Monocytes Relative: 10.3 %
Neutro Abs: 2276 cells/uL (ref 1500–7800)
Neutrophils Relative %: 56.9 %
Platelets: 186 10*3/uL (ref 140–400)
RBC: 3.88 10*6/uL (ref 3.80–5.10)
RDW: 13.1 % (ref 11.0–15.0)
Total Lymphocyte: 27.5 %
WBC: 4 10*3/uL (ref 3.8–10.8)

## 2020-08-09 LAB — LIPID PANEL
Cholesterol: 116 mg/dL (ref <200)
HDL: 37 mg/dL — ABNORMAL LOW (ref >=50)
LDL Cholesterol (Calc): 56 mg/dL (calc)
Non-HDL Cholesterol (Calc): 79 mg/dL (calc) (ref <130)
Total CHOL/HDL Ratio: 3.1 (calc) (ref <5.0)
Triglycerides: 157 mg/dL — ABNORMAL HIGH (ref <150)

## 2020-08-09 LAB — COMPREHENSIVE METABOLIC PANEL
AG Ratio: 2 (calc) (ref 1.0–2.5)
ALT: 20 U/L (ref 6–29)
AST: 26 U/L (ref 10–35)
Albumin: 4 g/dL (ref 3.6–5.1)
Alkaline phosphatase (APISO): 63 U/L (ref 37–153)
BUN: 19 mg/dL (ref 7–25)
CO2: 25 mmol/L (ref 20–32)
Calcium: 8.8 mg/dL (ref 8.6–10.4)
Chloride: 105 mmol/L (ref 98–110)
Creat: 0.91 mg/dL (ref 0.60–0.93)
Globulin: 2 g/dL (calc) (ref 1.9–3.7)
Glucose, Bld: 127 mg/dL — ABNORMAL HIGH (ref 65–99)
Potassium: 3.5 mmol/L (ref 3.5–5.3)
Sodium: 140 mmol/L (ref 135–146)
Total Bilirubin: 0.8 mg/dL (ref 0.2–1.2)
Total Protein: 6 g/dL — ABNORMAL LOW (ref 6.1–8.1)

## 2020-08-12 MED ORDER — MIRABEGRON ER 25 MG PO TB24: 25.0000 mg | ORAL_TABLET | Freq: Every day | ORAL | 6 refills | Status: DC

## 2020-08-12 NOTE — Addendum Note (Signed)
Addended by: Alois Cliche on: 08/12/2020 12:27 PM   Modules accepted: Orders

## 2020-08-18 ENCOUNTER — Other Ambulatory Visit: Payer: Self-pay | Admitting: Internal Medicine

## 2020-08-19 ENCOUNTER — Other Ambulatory Visit: Payer: Self-pay | Admitting: Internal Medicine

## 2020-09-02 ENCOUNTER — Ambulatory Visit: Payer: PPO | Admitting: Internal Medicine

## 2020-09-05 ENCOUNTER — Other Ambulatory Visit (HOSPITAL_COMMUNITY): Payer: Self-pay | Admitting: Otolaryngology

## 2020-09-05 ENCOUNTER — Other Ambulatory Visit: Payer: Self-pay | Admitting: Otolaryngology

## 2020-09-05 DIAGNOSIS — H9211 Otorrhea, right ear: Secondary | ICD-10-CM | POA: Diagnosis not present

## 2020-09-05 DIAGNOSIS — H905 Unspecified sensorineural hearing loss: Secondary | ICD-10-CM

## 2020-09-05 DIAGNOSIS — H903 Sensorineural hearing loss, bilateral: Secondary | ICD-10-CM | POA: Diagnosis not present

## 2020-09-05 DIAGNOSIS — H9311 Tinnitus, right ear: Secondary | ICD-10-CM

## 2020-09-11 ENCOUNTER — Other Ambulatory Visit: Payer: Self-pay

## 2020-09-11 ENCOUNTER — Ambulatory Visit
Admission: RE | Admit: 2020-09-11 | Discharge: 2020-09-11 | Disposition: A | Discharge status: Home / Self Care | Payer: PPO | Source: Ambulatory Visit | Attending: Otolaryngology | Admitting: Otolaryngology

## 2020-09-11 DIAGNOSIS — H905 Unspecified sensorineural hearing loss: Secondary | ICD-10-CM | POA: Insufficient documentation

## 2020-09-11 DIAGNOSIS — H9311 Tinnitus, right ear: Secondary | ICD-10-CM | POA: Diagnosis not present

## 2020-09-11 DIAGNOSIS — H9041 Sensorineural hearing loss, unilateral, right ear, with unrestricted hearing on the contralateral side: Secondary | ICD-10-CM | POA: Diagnosis not present

## 2020-09-11 DIAGNOSIS — H748X1 Other specified disorders of right middle ear and mastoid: Secondary | ICD-10-CM | POA: Diagnosis not present

## 2020-09-11 DIAGNOSIS — J320 Chronic maxillary sinusitis: Secondary | ICD-10-CM | POA: Diagnosis not present

## 2020-09-11 MED ORDER — GADOBUTROL 1 MMOL/ML IV SOLN
7.0000 mL | Freq: Once | INTRAVENOUS | Status: AC | PRN
  Administered 2020-09-11: 7 mL via INTRAVENOUS

## 2020-09-13 ENCOUNTER — Ambulatory Visit: Payer: PPO

## 2020-09-26 DIAGNOSIS — R059 Cough, unspecified: Secondary | ICD-10-CM | POA: Diagnosis not present

## 2020-09-26 DIAGNOSIS — J301 Allergic rhinitis due to pollen: Secondary | ICD-10-CM | POA: Diagnosis not present

## 2020-09-26 DIAGNOSIS — H9211 Otorrhea, right ear: Secondary | ICD-10-CM | POA: Diagnosis not present

## 2020-09-26 DIAGNOSIS — J305 Allergic rhinitis due to food: Secondary | ICD-10-CM | POA: Diagnosis not present

## 2020-09-26 DIAGNOSIS — H903 Sensorineural hearing loss, bilateral: Secondary | ICD-10-CM | POA: Diagnosis not present

## 2020-10-13 DIAGNOSIS — H04123 Dry eye syndrome of bilateral lacrimal glands: Secondary | ICD-10-CM | POA: Diagnosis not present

## 2020-10-23 ENCOUNTER — Other Ambulatory Visit: Payer: Self-pay | Admitting: Internal Medicine

## 2020-11-06 ENCOUNTER — Ambulatory Visit (INDEPENDENT_AMBULATORY_CARE_PROVIDER_SITE_OTHER): Payer: PPO | Admitting: Family Medicine

## 2020-11-06 ENCOUNTER — Encounter: Payer: Self-pay | Admitting: Family Medicine

## 2020-11-06 VITALS — BP 199/90 | HR 87 | Ht 63.0 in | Wt 165.3 lb

## 2020-11-06 DIAGNOSIS — I10 Essential (primary) hypertension: Secondary | ICD-10-CM

## 2020-11-06 DIAGNOSIS — R0602 Shortness of breath: Secondary | ICD-10-CM | POA: Insufficient documentation

## 2020-11-06 MED ORDER — LISINOPRIL 10 MG PO TABS: 10.0000 mg | ORAL_TABLET | Freq: Every day | ORAL | 3 refills | Status: DC

## 2020-11-06 NOTE — Assessment & Plan Note (Signed)
Patient reports sob with mild exertion. She denies CP association.   Plan- ECG wnl. I am going to get her scheduled for a stress test with DR [facility].

## 2020-11-06 NOTE — Assessment & Plan Note (Signed)
Patient with BP not under control, taking all meds even though she has not taken meds today. She says sometimes she takes her BP medication at night. I advised her to try and take at the same time everyday.   Plan- Start Lisinopril 10 mg fu 2 weeks.

## 2020-11-06 NOTE — Progress Notes (Signed)
Established Patient Office Visit  SUBJECTIVE:  Subjective  Patient ID: Amanda Cruz, female    DOB: 1946/03/14  Age: 75 y.o. MRN: [id number]  CC:  Chief Complaint  Patient presents with  . Hypertension    HPI Amanda Cruz is a 75 y.o. female presenting today for     Past Medical History:  Diagnosis Date  . GERD (gastroesophageal reflux disease)   . Hypertension   . Wears dentures    full upper and lower    Past Surgical History:  Procedure Laterality Date  . CATARACT EXTRACTION W/PHACO Left 08/22/2019   Procedure: CATARACT EXTRACTION PHACO AND INTRAOCULAR LENS PLACEMENT (IOC) LEFT  9.98  01:10.4  14.2%;  Surgeon: Leandrew Koyanagi, MD;  Location: [facility];  Service: Ophthalmology;  Laterality: Left;  . CATARACT EXTRACTION W/PHACO Right 09/19/2019   Procedure: CATARACT EXTRACTION PHACO AND INTRAOCULAR LENS PLACEMENT (IOC) RIGHT PANOPTIX LENS 7.44  00:52.4  14.3%;  Surgeon: Leandrew Koyanagi, MD;  Location: [facility];  Service: Ophthalmology;  Laterality: Right;  . COLONOSCOPY WITH PROPOFOL N/A 05/09/2018   Procedure: COLONOSCOPY WITH PROPOFOL;  Surgeon: [name], MD;  Location: [facility] ENDOSCOPY;  Service: Endoscopy;  Laterality: N/A;  . ESOPHAGOGASTRODUODENOSCOPY (EGD) WITH PROPOFOL N/A 02/05/2020   Procedure: ESOPHAGOGASTRODUODENOSCOPY (EGD) WITH PROPOFOL;  Surgeon: [name], MD;  Location[facility] ENDOSCOPY;  Service: Endoscopy;  Laterality: N/A;  . EYE SURGERY    . TEAR DUCT PROBING  03/10/2015  . TUBAL LIGATION      Family History  Problem Relation Age of Onset  . Stroke Mother   . Heart failure Mother   . Thyroid disease Paternal Grandfather     Social History   Socioeconomic History  . Marital status: Married    Spouse name: Not on file  . Number of children: Not on file  . Years of education: Not on file  . Highest education level: Not on file  Occupational History  . Not on file  Tobacco Use  . Smoking status: Never  Smoker  . Smokeless tobacco: Never Used  Vaping Use  . Vaping Use: Never used  Substance and Sexual Activity  . Alcohol use: Not Currently    Alcohol/week: 1.0 standard drink    Types: 1 Glasses of wine per week  . Drug use: No  . Sexual activity: Not Currently    Birth control/protection: Post-menopausal  Other Topics Concern  . Not on file  Social History Narrative  . Not on file   Social Determinants of Health   Financial Resource Strain: Not on file  Food Insecurity: Not on file  Transportation Needs: Not on file  Physical Activity: Not on file  Stress: Not on file  Social Connections: Not on file  Intimate Partner Violence: Not on file     Current Outpatient Medications:  .  lisinopril (ZESTRIL) 10 MG tablet, Take 1 tablet (10 mg total) by mouth daily., Disp: 90 tablet, Rfl: 3 .  ASPIRIN 81 PO, Take by mouth every other day., Disp: , Rfl:  .  atorvastatin (LIPITOR) 40 MG tablet, Take 1 tablet by mouth once daily, Disp: 90 tablet, Rfl: 0 .  Biotin w/ Vitamins C & E (HAIR/SKIN/NAILS PO), Take by mouth daily., Disp: , Rfl:  .  Cholecalciferol (VITAMIN D3) 25 MCG (1000 UT) CAPS, Take by mouth daily., Disp: , Rfl:  .  meclizine (ANTIVERT) 12.5 MG tablet, Take 1 tablet (12.5 mg total) by mouth 3 (three) times daily as needed for dizziness., Disp:  30 tablet, Rfl: 0 .  metoprolol succinate (TOPROL-XL) 50 MG 24 hr tablet, Take 50 mg by mouth., Disp: , Rfl:  .  mirabegron ER (MYRBETRIQ) 25 MG TB24 tablet, Take 1 tablet (25 mg total) by mouth daily., Disp: 30 tablet, Rfl: 6 .  telmisartan-hydrochlorothiazide (MICARDIS HCT) 80-12.5 MG tablet, Take 1 tablet by mouth once daily, Disp: 30 tablet, Rfl: 0   No Known Allergies  ROS Review of Systems  Constitutional: Negative.   HENT: Negative.   Respiratory: Positive for shortness of breath.   Cardiovascular: Negative.  Negative for chest pain.  Musculoskeletal: Negative.   Neurological: Negative.   Psychiatric/Behavioral:  Negative.      OBJECTIVE:    Physical Exam Vitals and nursing note reviewed.  Constitutional:      Appearance: Normal appearance.  HENT:     Head: Atraumatic.     Nose: Nose normal.  Cardiovascular:     Rate and Rhythm: Normal rate and regular rhythm.  Pulmonary:     Effort: Pulmonary effort is normal.     Breath sounds: Normal breath sounds.  Musculoskeletal:        General: Normal range of motion.  Skin:    General: Skin is warm.     Capillary Refill: Capillary refill takes less than 2 seconds.  Neurological:     Mental Status: She is alert.     BP (!) 199/90   Pulse 87   Ht '5\' 3"'  (1.6 m)   Wt 165 lb 4.8 oz (75 kg)   BMI 29.28 kg/m  Wt Readings from Last 3 Encounters:  11/06/20 165 lb 4.8 oz (75 kg)  08/08/20 165 lb 11.2 oz (75.2 kg)  07/01/20 164 lb 11.2 oz (74.7 kg)    Health Maintenance Due  Topic Date Due  . Hepatitis C Screening  Never done  . TETANUS/TDAP  Never done  . PNA vac Low Risk Adult (1 of 2 - PCV13) Never done  . COVID-19 Vaccine (3 - Booster for Pfizer series) 05/16/2020    There are no preventive care reminders to display for this patient.  CBC Latest Ref Rng & Units 08/08/2020 10/16/2015  WBC 3.8 - 10.8 Thousand/uL 4.0 4.0  Hemoglobin 11.7 - 15.5 g/dL 12.1 13.0  Hematocrit 35.0 - 45.0 % 34.8(L) 37.6  Platelets 140 - 400 Thousand/uL 186 189   CMP Latest Ref Rng & Units 08/08/2020  Glucose 65 - 99 mg/dL 127(H)  BUN 7 - 25 mg/dL 19  Creatinine 0.60 - 0.93 mg/dL 0.91  Sodium 135 - 146 mmol/L 140  Potassium 3.5 - 5.3 mmol/L 3.5  Chloride 98 - 110 mmol/L 105  CO2 20 - 32 mmol/L 25  Calcium 8.6 - 10.4 mg/dL 8.8  Total Protein 6.1 - 8.1 g/dL 6.0(L)  Total Bilirubin 0.2 - 1.2 mg/dL 0.8  AST 10 - 35 U/L 26  ALT 6 - 29 U/L 20    Lab Results  Component Value Date   TSH 1.520 10/16/2015   No results found for: ALBUMIN, ANIONGAP, EGFR, GFR Lab Results  Component Value Date   CHOL 116 08/08/2020   HDL 37 (L) 08/08/2020   LDLCALC 56  08/08/2020   CHOLHDL 3.1 08/08/2020   Lab Results  Component Value Date   TRIG 157 (H) 08/08/2020   No results found for: HGBA1C    ASSESSMENT & PLAN:   Problem List Items Addressed This Visit      Cardiovascular and Mediastinum   Essential hypertension - Primary  Patient with BP not under control, taking all meds even though she has not taken meds today. She says sometimes she takes her BP medication at night. I advised her to try and take at the same time everyday.   Plan- Start Lisinopril 10 mg fu 2 weeks.       Relevant Medications   lisinopril (ZESTRIL) 10 MG tablet     Other   Shortness of breath    Patient reports sob with mild exertion. She denies CP association.   Plan- ECG wnl. I am going to get her scheduled for a stress test with DR [facility].       Relevant Orders   EKG 12-Lead      Meds ordered this encounter  Medications  . lisinopril (ZESTRIL) 10 MG tablet    Sig: Take 1 tablet (10 mg total) by mouth daily.    Dispense:  90 tablet    Refill:  3      Follow-up: No follow-ups on file.    Beckie Salts, [place] [address], [place], [place] [address]

## 2020-11-11 DIAGNOSIS — R059 Cough, unspecified: Secondary | ICD-10-CM | POA: Diagnosis not present

## 2020-11-11 DIAGNOSIS — H903 Sensorineural hearing loss, bilateral: Secondary | ICD-10-CM | POA: Diagnosis not present

## 2020-11-11 DIAGNOSIS — J301 Allergic rhinitis due to pollen: Secondary | ICD-10-CM | POA: Diagnosis not present

## 2020-11-17 ENCOUNTER — Other Ambulatory Visit: Payer: Self-pay

## 2020-11-17 ENCOUNTER — Encounter: Payer: Self-pay | Admitting: *Deleted

## 2020-11-17 ENCOUNTER — Ambulatory Visit (INDEPENDENT_AMBULATORY_CARE_PROVIDER_SITE_OTHER): Payer: PPO | Admitting: Internal Medicine

## 2020-11-17 ENCOUNTER — Encounter: Payer: Self-pay | Admitting: Internal Medicine

## 2020-11-17 ENCOUNTER — Other Ambulatory Visit: Payer: Self-pay | Admitting: Family Medicine

## 2020-11-17 DIAGNOSIS — R0602 Shortness of breath: Secondary | ICD-10-CM | POA: Diagnosis not present

## 2020-11-17 DIAGNOSIS — I10 Essential (primary) hypertension: Secondary | ICD-10-CM

## 2020-11-17 DIAGNOSIS — R059 Cough, unspecified: Secondary | ICD-10-CM | POA: Diagnosis not present

## 2020-11-17 DIAGNOSIS — U071 COVID-19: Secondary | ICD-10-CM

## 2020-11-17 DIAGNOSIS — E782 Mixed hyperlipidemia: Secondary | ICD-10-CM

## 2020-11-17 MED ORDER — AZITHROMYCIN 250 MG PO TABS: ORAL_TABLET | ORAL | 0 refills | Status: DC

## 2020-11-17 NOTE — Assessment & Plan Note (Signed)
Covid test is negative

## 2020-11-17 NOTE — Assessment & Plan Note (Signed)
Patient denies any chest pain but has evidence of bronchitis with mucus phlegm, I started the patient on Z-Pak as directed

## 2020-11-17 NOTE — Assessment & Plan Note (Signed)

## 2020-11-17 NOTE — Assessment & Plan Note (Signed)
Hypercholesterolemia  I advised the patient to follow Mediterranean diet This diet is rich in fruits vegetables and whole grain, and This diet is also rich in fish and lean meat Patient should also eat a handful of almonds or walnuts daily Recent heart study indicated that average follow-up on this kind of diet reduces the cardiovascular mortality by 50 to 70%== 

## 2020-11-17 NOTE — Progress Notes (Signed)
Established Patient Office Visit  Subjective:  Patient ID: Amanda Cruz, female    DOB: 03/24/1946  Age: 75 y.o. MRN: [id number]  CC: No chief complaint on file.   HPI  Amanda Cruz presents for cough cold and bronchitis and shortness of breath  Past Medical History:  Diagnosis Date  . GERD (gastroesophageal reflux disease)   . Hypertension   . Wears dentures    full upper and lower    Past Surgical History:  Procedure Laterality Date  . CATARACT EXTRACTION W/PHACO Left 08/22/2019   Procedure: CATARACT EXTRACTION PHACO AND INTRAOCULAR LENS PLACEMENT (IOC) LEFT  9.98  01:10.4  14.2%;  Surgeon: Leandrew Koyanagi, MD;  Location: [place];  Service: Ophthalmology;  Laterality: Left;  . CATARACT EXTRACTION W/PHACO Right 09/19/2019   Procedure: CATARACT EXTRACTION PHACO AND INTRAOCULAR LENS PLACEMENT (IOC) RIGHT PANOPTIX LENS 7.44  00:52.4  14.3%;  Surgeon: Leandrew Koyanagi, MD;  Location: [facility];  Service: Ophthalmology;  Laterality: Right;  . COLONOSCOPY WITH PROPOFOL N/A 05/09/2018   Procedure: COLONOSCOPY WITH PROPOFOL;  Surgeon: [name], MD;  Location: [facility] ENDOSCOPY;  Service: Endoscopy;  Laterality: N/A;  . ESOPHAGOGASTRODUODENOSCOPY (EGD) WITH PROPOFOL N/A 02/05/2020   Procedure: ESOPHAGOGASTRODUODENOSCOPY (EGD) WITH PROPOFOL;  Surgeon: [name], MD;  Location: [facility] ENDOSCOPY;  Service: Endoscopy;  Laterality: N/A;  . EYE SURGERY    . TEAR DUCT PROBING  03/10/2015  . TUBAL LIGATION      Family History  Problem Relation Age of Onset  . Stroke Mother   . Heart failure Mother   . Thyroid disease Paternal Grandfather     Social History   Socioeconomic History  . Marital status: Married    Spouse name: Not on file  . Number of children: Not on file  . Years of education: Not on file  . Highest education level: Not on file  Occupational History  . Not on file  Tobacco Use  . Smoking status: Never Smoker  . Smokeless  tobacco: Never Used  Vaping Use  . Vaping Use: Never used  Substance and Sexual Activity  . Alcohol use: Not Currently    Alcohol/week: 1.0 standard drink    Types: 1 Glasses of wine per week  . Drug use: No  . Sexual activity: Not Currently    Birth control/protection: Post-menopausal  Other Topics Concern  . Not on file  Social History Narrative  . Not on file   Social Determinants of Health   Financial Resource Strain: Not on file  Food Insecurity: Not on file  Transportation Needs: Not on file  Physical Activity: Not on file  Stress: Not on file  Social Connections: Not on file  Intimate Partner Violence: Not on file     Current Outpatient Medications:  .  azithromycin (ZITHROMAX) 250 MG tablet, As directed, Disp: 6 tablet, Rfl: 0 .  ASPIRIN 81 PO, Take by mouth every other day., Disp: , Rfl:  .  atorvastatin (LIPITOR) 40 MG tablet, Take 1 tablet by mouth once daily, Disp: 90 tablet, Rfl: 0 .  Biotin w/ Vitamins C & E (HAIR/SKIN/NAILS PO), Take by mouth daily., Disp: , Rfl:  .  Cholecalciferol (VITAMIN D3) 25 MCG (1000 UT) CAPS, Take by mouth daily., Disp: , Rfl:  .  lisinopril (ZESTRIL) 10 MG tablet, Take 1 tablet (10 mg total) by mouth daily., Disp: 90 tablet, Rfl: 3 .  meclizine (ANTIVERT) 12.5 MG tablet, Take 1 tablet (12.5 mg total) by mouth 3 (three) times  daily as needed for dizziness., Disp: 30 tablet, Rfl: 0 .  metoprolol succinate (TOPROL-XL) 50 MG 24 hr tablet, Take 50 mg by mouth., Disp: , Rfl:  .  mirabegron ER (MYRBETRIQ) 25 MG TB24 tablet, Take 1 tablet (25 mg total) by mouth daily., Disp: 30 tablet, Rfl: 6 .  telmisartan-hydrochlorothiazide (MICARDIS HCT) 80-12.5 MG tablet, Take 1 tablet by mouth once daily, Disp: 30 tablet, Rfl: 0   No Known Allergies  ROS Review of Systems  Constitutional: Negative.   HENT: Negative.   Eyes: Negative.   Respiratory: Positive for cough. Negative for choking, chest tightness, shortness of breath and wheezing.    Cardiovascular: Negative.   Gastrointestinal: Negative.   Endocrine: Negative.   Genitourinary: Negative.   Musculoskeletal: Negative.   Skin: Negative.   Allergic/Immunologic: Negative.   Neurological: Negative.   Hematological: Negative.   Psychiatric/Behavioral: Negative.   All other systems reviewed and are negative.     Objective:    Physical Exam Vitals reviewed.  Constitutional:      Appearance: Normal appearance.  HENT:     Mouth/Throat:     Mouth: Mucous membranes are moist.  Eyes:     Pupils: Pupils are equal, round, and reactive to light.  Neck:     Vascular: No carotid bruit.  Cardiovascular:     Rate and Rhythm: Normal rate and regular rhythm.     Pulses: Normal pulses.     Heart sounds: Normal heart sounds.  Pulmonary:     Effort: Pulmonary effort is normal.     Breath sounds: Normal breath sounds.  Abdominal:     General: Bowel sounds are normal.     Palpations: Abdomen is soft. There is no hepatomegaly, splenomegaly or mass.     Tenderness: There is no abdominal tenderness.     Hernia: No hernia is present.  Musculoskeletal:        General: No tenderness.     Cervical back: Neck supple.     Right lower leg: No edema.     Left lower leg: No edema.  Skin:    Findings: No rash.  Neurological:     Mental Status: She is alert and oriented to person, place, and time.     Motor: No weakness.  Psychiatric:        Mood and Affect: Mood and affect normal.        Behavior: Behavior normal.     There were no vitals taken for this visit. Wt Readings from Last 3 Encounters:  11/06/20 165 lb 4.8 oz (75 kg)  08/08/20 165 lb 11.2 oz (75.2 kg)  07/01/20 164 lb 11.2 oz (74.7 kg)     Health Maintenance Due  Topic Date Due  . Hepatitis C Screening  Never done  . TETANUS/TDAP  Never done  . PNA vac Low Risk Adult (1 of 2 - PCV13) Never done  . COVID-19 Vaccine (3 - Booster for Pfizer series) 05/16/2020    There are no preventive care reminders to  display for this patient.  Lab Results  Component Value Date   TSH 1.520 10/16/2015   Lab Results  Component Value Date   WBC 4.0 08/08/2020   HGB 12.1 08/08/2020   HCT 34.8 (L) 08/08/2020   MCV 89.7 08/08/2020   PLT 186 08/08/2020   Lab Results  Component Value Date   NA 140 08/08/2020   K 3.5 08/08/2020   CO2 25 08/08/2020   GLUCOSE 127 (H) 08/08/2020   BUN 19  08/08/2020   CREATININE 0.91 08/08/2020   BILITOT 0.8 08/08/2020   AST 26 08/08/2020   ALT 20 08/08/2020   PROT 6.0 (L) 08/08/2020   CALCIUM 8.8 08/08/2020   Lab Results  Component Value Date   CHOL 116 08/08/2020   Lab Results  Component Value Date   HDL 37 (L) 08/08/2020   Lab Results  Component Value Date   LDLCALC 56 08/08/2020   Lab Results  Component Value Date   TRIG 157 (H) 08/08/2020   Lab Results  Component Value Date   CHOLHDL 3.1 08/08/2020   No results found for: HGBA1C    Assessment & Plan:   Problem List Items Addressed This Visit      Cardiovascular and Mediastinum   Essential hypertension    Patient blood pressure is normal patient denies any chest pain or shortness of breath there is no history of palpitation paroxysmal nocturnal dyspnea patient can walkioo yards without any problem patient was advised to follow low-salt low-cholesterol diet  I reviewed the results of Sprint trial  ideally I want to keep systolic blood pressure below 130 mmHg, patient was asked to check blood pressure 3 times a week and give me a report on that.  Patient will be follow-up in 3 months, patient will call me back for any change in the cardiovascular symptoms           Other   Hyperlipidemia    Hypercholesterolemia  I advised the patient to follow Mediterranean diet This diet is rich in fruits vegetables and whole grain, and This diet is also rich in fish and lean meat Patient should also eat a handful of almonds or walnuts daily Recent heart study indicated that average follow-up on this  kind of diet reduces the cardiovascular mortality by 50 to 70%==      COVID-19 virus infection    Covid test is negative      Relevant Medications   azithromycin (ZITHROMAX) 250 MG tablet   Shortness of breath    Patient denies any chest pain but has evidence of bronchitis with mucus phlegm, I started the patient on Z-Pak as directed       Other Visit Diagnoses    Cough    -  Primary   Relevant Medications   azithromycin (ZITHROMAX) 250 MG tablet   Other Relevant Orders   POC COVID-19      Meds ordered this encounter  Medications  . azithromycin (ZITHROMAX) 250 MG tablet    Sig: As directed    Dispense:  6 tablet    Refill:  0    Follow-up: No follow-ups on file.    [name], MD

## 2020-12-05 ENCOUNTER — Ambulatory Visit (INDEPENDENT_AMBULATORY_CARE_PROVIDER_SITE_OTHER): Payer: PPO | Admitting: Family Medicine

## 2020-12-05 ENCOUNTER — Encounter: Payer: Self-pay | Admitting: Family Medicine

## 2020-12-05 ENCOUNTER — Other Ambulatory Visit: Payer: Self-pay

## 2020-12-05 VITALS — BP 163/80 | HR 75 | Ht 63.0 in | Wt 161.7 lb

## 2020-12-05 DIAGNOSIS — R059 Cough, unspecified: Secondary | ICD-10-CM | POA: Insufficient documentation

## 2020-12-05 MED ORDER — HYDROCODONE-HOMATROPINE 5-1.5 MG/5ML PO SYRP: 5.0000 mL | ORAL_SOLUTION | Freq: Three times a day (TID) | ORAL | 0 refills | Status: DC | PRN

## 2020-12-05 MED ORDER — ALBUTEROL SULFATE HFA 108 (90 BASE) MCG/ACT IN AERS: 2.0000 | INHALATION_SPRAY | Freq: Four times a day (QID) | RESPIRATORY_TRACT | 0 refills | Status: DC | PRN

## 2020-12-05 NOTE — Progress Notes (Signed)
Established Patient Office Visit  SUBJECTIVE:  Subjective  Patient ID: Amanda Cruz, female    DOB: 12-26-1945  Age: 75 y.o. MRN: [id number]  CC:  Chief Complaint  Patient presents with  . Follow-up    Pt here for back pain from coughing a lot     HPI Amanda Cruz is a 75 y.o. female presenting today for     Past Medical History:  Diagnosis Date  . GERD (gastroesophageal reflux disease)   . Hypertension   . Wears dentures    full upper and lower    Past Surgical History:  Procedure Laterality Date  . CATARACT EXTRACTION W/PHACO Left 08/22/2019   Procedure: CATARACT EXTRACTION PHACO AND INTRAOCULAR LENS PLACEMENT (IOC) LEFT  9.98  01:10.4  14.2%;  Surgeon: Leandrew Koyanagi, MD;  Location: [facility];  Service: Ophthalmology;  Laterality: Left;  . CATARACT EXTRACTION W/PHACO Right 09/19/2019   Procedure: CATARACT EXTRACTION PHACO AND INTRAOCULAR LENS PLACEMENT (IOC) RIGHT PANOPTIX LENS 7.44  00:52.4  14.3%;  Surgeon: Leandrew Koyanagi, MD;  Location: [facility];  Service: Ophthalmology;  Laterality: Right;  . COLONOSCOPY WITH PROPOFOL N/A 05/09/2018   Procedure: COLONOSCOPY WITH PROPOFOL;  Surgeon: [name], MD;  Location: [facility] ENDOSCOPY;  Service: Endoscopy;  Laterality: N/A;  . ESOPHAGOGASTRODUODENOSCOPY (EGD) WITH PROPOFOL N/A 02/05/2020   Procedure: ESOPHAGOGASTRODUODENOSCOPY (EGD) WITH PROPOFOL;  Surgeon: [name], MD;  Location: [facility] ENDOSCOPY;  Service: Endoscopy;  Laterality: N/A;  . EYE SURGERY    . TEAR DUCT PROBING  03/10/2015  . TUBAL LIGATION      Family History  Problem Relation Age of Onset  . Stroke Mother   . Heart failure Mother   . Thyroid disease Paternal Grandfather     Social History   Socioeconomic History  . Marital status: Married    Spouse name: Not on file  . Number of children: Not on file  . Years of education: Not on file  . Highest education level: Not on file  Occupational History  . Not on  file  Tobacco Use  . Smoking status: Never Smoker  . Smokeless tobacco: Never Used  Vaping Use  . Vaping Use: Never used  Substance and Sexual Activity  . Alcohol use: Not Currently    Alcohol/week: 1.0 standard drink    Types: 1 Glasses of wine per week  . Drug use: No  . Sexual activity: Not Currently    Birth control/protection: Post-menopausal  Other Topics Concern  . Not on file  Social History Narrative  . Not on file   Social Determinants of Health   Financial Resource Strain: Not on file  Food Insecurity: Not on file  Transportation Needs: Not on file  Physical Activity: Not on file  Stress: Not on file  Social Connections: Not on file  Intimate Partner Violence: Not on file     Current Outpatient Medications:  .  HYDROcodone-homatropine (HYCODAN) 5-1.5 MG/5ML syrup, Take 5 mLs by mouth every 8 (eight) hours as needed for cough., Disp: 120 mL, Rfl: 0 .  ASPIRIN 81 PO, Take by mouth every other day., Disp: , Rfl:  .  atorvastatin (LIPITOR) 40 MG tablet, Take 1 tablet by mouth once daily, Disp: 90 tablet, Rfl: 0 .  azithromycin (ZITHROMAX) 250 MG tablet, As directed, Disp: 6 tablet, Rfl: 0 .  Biotin w/ Vitamins C & E (HAIR/SKIN/NAILS PO), Take by mouth daily., Disp: , Rfl:  .  Cholecalciferol (VITAMIN D3) 25 MCG (1000 UT) CAPS, Take  by mouth daily., Disp: , Rfl:  .  lisinopril (ZESTRIL) 10 MG tablet, Take 1 tablet (10 mg total) by mouth daily., Disp: 90 tablet, Rfl: 3 .  meclizine (ANTIVERT) 12.5 MG tablet, Take 1 tablet (12.5 mg total) by mouth 3 (three) times daily as needed for dizziness., Disp: 30 tablet, Rfl: 0 .  metoprolol succinate (TOPROL-XL) 50 MG 24 hr tablet, Take 50 mg by mouth., Disp: , Rfl:  .  mirabegron ER (MYRBETRIQ) 25 MG TB24 tablet, Take 1 tablet (25 mg total) by mouth daily., Disp: 30 tablet, Rfl: 6 .  telmisartan-hydrochlorothiazide (MICARDIS HCT) 80-12.5 MG tablet, Take 1 tablet by mouth once daily, Disp: 30 tablet, Rfl: 0   No Known  Allergies  ROS Review of Systems  Constitutional: Negative.   HENT: Negative.   Respiratory: Positive for cough. Negative for wheezing and stridor.   Cardiovascular: Negative.   Gastrointestinal: Negative.   Genitourinary: Negative.   Musculoskeletal: Negative.   Psychiatric/Behavioral: Negative.      OBJECTIVE:    Physical Exam Constitutional:      Appearance: Normal appearance.  HENT:     Head: Normocephalic.  Cardiovascular:     Rate and Rhythm: Normal rate and regular rhythm.  Pulmonary:     Breath sounds: No wheezing or rales.  Musculoskeletal:        General: Normal range of motion.  Skin:    General: Skin is warm.  Neurological:     Mental Status: She is alert.  Psychiatric:        Mood and Affect: Mood normal.     BP (!) 163/80   Pulse 75   Ht '5\' 3"'  (1.6 m)   Wt 161 lb 11.2 oz (73.3 kg)   BMI 28.64 kg/m  Wt Readings from Last 3 Encounters:  12/05/20 161 lb 11.2 oz (73.3 kg)  11/06/20 165 lb 4.8 oz (75 kg)  08/08/20 165 lb 11.2 oz (75.2 kg)    Health Maintenance Due  Topic Date Due  . Hepatitis C Screening  Never done  . TETANUS/TDAP  Never done  . PNA vac Low Risk Adult (1 of 2 - PCV13) Never done  . COVID-19 Vaccine (3 - Booster for Pfizer series) 05/16/2020    There are no preventive care reminders to display for this patient.  CBC Latest Ref Rng & Units 08/08/2020 10/16/2015  WBC 3.8 - 10.8 Thousand/uL 4.0 4.0  Hemoglobin 11.7 - 15.5 g/dL 12.1 13.0  Hematocrit 35.0 - 45.0 % 34.8(L) 37.6  Platelets 140 - 400 Thousand/uL 186 189   CMP Latest Ref Rng & Units 08/08/2020  Glucose 65 - 99 mg/dL 127(H)  BUN 7 - 25 mg/dL 19  Creatinine 0.60 - 0.93 mg/dL 0.91  Sodium 135 - 146 mmol/L 140  Potassium 3.5 - 5.3 mmol/L 3.5  Chloride 98 - 110 mmol/L 105  CO2 20 - 32 mmol/L 25  Calcium 8.6 - 10.4 mg/dL 8.8  Total Protein 6.1 - 8.1 g/dL 6.0(L)  Total Bilirubin 0.2 - 1.2 mg/dL 0.8  AST 10 - 35 U/L 26  ALT 6 - 29 U/L 20    Lab Results  Component  Value Date   TSH 1.520 10/16/2015   No results found for: ALBUMIN, ANIONGAP, EGFR, GFR Lab Results  Component Value Date   CHOL 116 08/08/2020   HDL 37 (L) 08/08/2020   LDLCALC 56 08/08/2020   CHOLHDL 3.1 08/08/2020   Lab Results  Component Value Date   TRIG 157 (H) 08/08/2020   No  results found for: HGBA1C    ASSESSMENT & PLAN:   Problem List Items Addressed This Visit      Other   Cough - Primary    Patient with continued cough which is keeping her up at night and has caused R upper back pain. Nothing has controlled the cough up to this point.  Plan- Narcotic cough medication for now, return with any sob          Meds ordered this encounter  Medications  . HYDROcodone-homatropine (HYCODAN) 5-1.5 MG/5ML syrup    Sig: Take 5 mLs by mouth every 8 (eight) hours as needed for cough.    Dispense:  120 mL    Refill:  0      Follow-up: No follow-ups on file.    Beckie Salts, [place] [address] [place], [place] [address]

## 2020-12-05 NOTE — Assessment & Plan Note (Signed)
Patient with continued cough which is keeping her up at night and has caused R upper back pain. Nothing has controlled the cough up to this point.  Plan- Narcotic cough medication for now, return with any sob

## 2020-12-18 ENCOUNTER — Other Ambulatory Visit: Payer: Self-pay | Admitting: Internal Medicine

## 2020-12-24 ENCOUNTER — Other Ambulatory Visit: Payer: Self-pay | Admitting: *Deleted

## 2020-12-30 ENCOUNTER — Other Ambulatory Visit: Payer: Self-pay | Admitting: Internal Medicine

## 2021-01-02 LAB — POC COVID19 BINAXNOW: SARS Coronavirus 2 Ag: NEGATIVE

## 2021-01-05 ENCOUNTER — Ambulatory Visit: Payer: PPO | Admitting: Internal Medicine

## 2021-01-12 ENCOUNTER — Other Ambulatory Visit: Payer: Self-pay

## 2021-01-12 ENCOUNTER — Encounter: Payer: Self-pay | Admitting: Internal Medicine

## 2021-01-12 ENCOUNTER — Ambulatory Visit (INDEPENDENT_AMBULATORY_CARE_PROVIDER_SITE_OTHER): Payer: PPO | Admitting: Internal Medicine

## 2021-01-12 VITALS — BP 148/89 | HR 82 | Ht 63.0 in | Wt 161.2 lb

## 2021-01-12 DIAGNOSIS — I10 Essential (primary) hypertension: Secondary | ICD-10-CM

## 2021-01-12 DIAGNOSIS — J301 Allergic rhinitis due to pollen: Secondary | ICD-10-CM | POA: Diagnosis not present

## 2021-01-12 DIAGNOSIS — I872 Venous insufficiency (chronic) (peripheral): Secondary | ICD-10-CM | POA: Diagnosis not present

## 2021-01-12 DIAGNOSIS — K219 Gastro-esophageal reflux disease without esophagitis: Secondary | ICD-10-CM | POA: Diagnosis not present

## 2021-01-12 DIAGNOSIS — L309 Dermatitis, unspecified: Secondary | ICD-10-CM

## 2021-01-12 NOTE — Assessment & Plan Note (Signed)
Blood pressure has come down but she continues to complain of cough, I will stop the lisinopril.  As she is taking Micardis.

## 2021-01-12 NOTE — Progress Notes (Signed)
Established Patient Office Visit  Subjective:  Patient ID: Amanda Cruz, female    DOB: 1945/11/16  Age: 75 y.o. MRN: [id number]  CC:  Chief Complaint  Patient presents with  . Hypertension    HPI  [name] presents for cough. Pt is not a smoker , h/o allergic asthama  Past Medical History:  Diagnosis Date  . GERD (gastroesophageal reflux disease)   . Hypertension   . Wears dentures    full upper and lower    Past Surgical History:  Procedure Laterality Date  . CATARACT EXTRACTION W/PHACO Left 08/22/2019   Procedure: CATARACT EXTRACTION PHACO AND INTRAOCULAR LENS PLACEMENT (IOC) LEFT  9.98  01:10.4  14.2%;  Surgeon: Leandrew Koyanagi, MD;  Location: [facility];  Service: Ophthalmology;  Laterality: Left;  . CATARACT EXTRACTION W/PHACO Right 09/19/2019   Procedure: CATARACT EXTRACTION PHACO AND INTRAOCULAR LENS PLACEMENT (IOC) RIGHT PANOPTIX LENS 7.44  00:52.4  14.3%;  Surgeon: Leandrew Koyanagi, MD;  Location: [facility];  Service: Ophthalmology;  Laterality: Right;  . COLONOSCOPY WITH PROPOFOL N/A 05/09/2018   Procedure: COLONOSCOPY WITH PROPOFOL;  Surgeon: [name], MD;  Location: [facility] ENDOSCOPY;  Service: Endoscopy;  Laterality: N/A;  . ESOPHAGOGASTRODUODENOSCOPY (EGD) WITH PROPOFOL N/A 02/05/2020   Procedure: ESOPHAGOGASTRODUODENOSCOPY (EGD) WITH PROPOFOL;  Surgeon: [name], MD;  Location: [facility] ENDOSCOPY;  Service: Endoscopy;  Laterality: N/A;  . EYE SURGERY    . TEAR DUCT PROBING  03/10/2015  . TUBAL LIGATION      Family History  Problem Relation Age of Onset  . Stroke Mother   . Heart failure Mother   . Thyroid disease Paternal Grandfather     Social History   Socioeconomic History  . Marital status: Married    Spouse name: Not on file  . Number of children: Not on file  . Years of education: Not on file  . Highest education level: Not on file  Occupational History  . Not on file  Tobacco Use  . Smoking status:  Never Smoker  . Smokeless tobacco: Never Used  Vaping Use  . Vaping Use: Never used  Substance and Sexual Activity  . Alcohol use: Not Currently    Alcohol/week: 1.0 standard drink    Types: 1 Glasses of wine per week  . Drug use: No  . Sexual activity: Not Currently    Birth control/protection: Post-menopausal  Other Topics Concern  . Not on file  Social History Narrative  . Not on file   Social Determinants of Health   Financial Resource Strain: Not on file  Food Insecurity: Not on file  Transportation Needs: Not on file  Physical Activity: Not on file  Stress: Not on file  Social Connections: Not on file  Intimate Partner Violence: Not on file     Current Outpatient Medications:  .  albuterol (VENTOLIN HFA) 108 (90 Base) MCG/ACT inhaler, Inhale 2 puffs into the lungs every 6 (six) hours as needed for wheezing or shortness of breath., Disp: 8 g, Rfl: 0 .  ASPIRIN 81 PO, Take by mouth every other day., Disp: , Rfl:  .  atorvastatin (LIPITOR) 40 MG tablet, Take 1 tablet by mouth once daily, Disp: 90 tablet, Rfl: 0 .  azithromycin (ZITHROMAX) 250 MG tablet, As directed, Disp: 6 tablet, Rfl: 0 .  Biotin w/ Vitamins C & E (HAIR/SKIN/NAILS PO), Take by mouth daily., Disp: , Rfl:  .  Cholecalciferol (VITAMIN D3) 25 MCG (1000 UT) CAPS, Take by mouth daily., Disp: ,  Rfl:  .  HYDROcodone-homatropine (HYCODAN) 5-1.5 MG/5ML syrup, Take 5 mLs by mouth every 8 (eight) hours as needed for cough., Disp: 120 mL, Rfl: 0 .  meclizine (ANTIVERT) 12.5 MG tablet, Take 1 tablet (12.5 mg total) by mouth 3 (three) times daily as needed for dizziness., Disp: 30 tablet, Rfl: 0 .  metoprolol succinate (TOPROL-XL) 50 MG 24 hr tablet, Take 50 mg by mouth., Disp: , Rfl:  .  metoprolol tartrate (LOPRESSOR) 50 MG tablet, Take 1 tablet by mouth once daily, Disp: 90 tablet, Rfl: 0 .  mirabegron ER (MYRBETRIQ) 25 MG TB24 tablet, Take 1 tablet (25 mg total) by mouth daily., Disp: 30 tablet, Rfl: 6 .   telmisartan-hydrochlorothiazide (MICARDIS HCT) 80-12.5 MG tablet, Take 1 tablet by mouth once daily, Disp: 30 tablet, Rfl: 0   No Known Allergies  ROS Review of Systems  Constitutional: Negative.   HENT: Negative.  Negative for mouth sores.   Eyes: Negative.   Respiratory: Positive for cough. Negative for shortness of breath and wheezing.   Cardiovascular: Negative.  Negative for chest pain.  Gastrointestinal: Negative.  Negative for blood in stool.  Endocrine: Negative.   Genitourinary: Positive for difficulty urinating. Negative for pelvic pain.  Musculoskeletal: Negative.   Skin: Positive for color change and rash.  Allergic/Immunologic: Negative.   Neurological: Negative.   Hematological: Negative.   Psychiatric/Behavioral: Negative.   All other systems reviewed and are negative.     Objective:    Physical Exam Vitals reviewed.  Constitutional:      Appearance: Normal appearance.  HENT:     Mouth/Throat:     Mouth: Mucous membranes are moist.  Eyes:     Pupils: Pupils are equal, round, and reactive to light.  Neck:     Vascular: No carotid bruit.  Cardiovascular:     Rate and Rhythm: Normal rate and regular rhythm.     Pulses: Normal pulses.     Heart sounds: Normal heart sounds.  Pulmonary:     Effort: Pulmonary effort is normal.     Breath sounds: Normal breath sounds.  Abdominal:     General: Bowel sounds are normal.     Palpations: Abdomen is soft. There is no hepatomegaly, splenomegaly or mass.     Tenderness: There is no abdominal tenderness.     Hernia: No hernia is present.  Musculoskeletal:        General: No tenderness.     Cervical back: Neck supple.     Right lower leg: No edema.     Left lower leg: No edema.  Skin:    Findings: No rash.  Neurological:     Mental Status: She is alert and oriented to person, place, and time.     Motor: No weakness.  Psychiatric:        Mood and Affect: Mood and affect normal.        Behavior: Behavior  normal.     BP (!) 148/89   Pulse 82   Ht 5\' 3"  (1.6 m)   Wt 161 lb 3.2 oz (73.1 kg)   BMI 28.56 kg/m  Wt Readings from Last 3 Encounters:  01/12/21 161 lb 3.2 oz (73.1 kg)  12/05/20 161 lb 11.2 oz (73.3 kg)  11/06/20 165 lb 4.8 oz (75 kg)     Health Maintenance Due  Topic Date Due  . Hepatitis C Screening  Never done  . TETANUS/TDAP  Never done  . PNA vac Low Risk Adult (1 of 2 -  PCV13) Never done  . COVID-19 Vaccine (3 - Booster for Pfizer series) 04/15/2020    There are no preventive care reminders to display for this patient.  Lab Results  Component Value Date   TSH 1.520 10/16/2015   Lab Results  Component Value Date   WBC 4.0 08/08/2020   HGB 12.1 08/08/2020   HCT 34.8 (L) 08/08/2020   MCV 89.7 08/08/2020   PLT 186 08/08/2020   Lab Results  Component Value Date   NA 140 08/08/2020   K 3.5 08/08/2020   CO2 25 08/08/2020   GLUCOSE 127 (H) 08/08/2020   BUN 19 08/08/2020   CREATININE 0.91 08/08/2020   BILITOT 0.8 08/08/2020   AST 26 08/08/2020   ALT 20 08/08/2020   PROT 6.0 (L) 08/08/2020   CALCIUM 8.8 08/08/2020   Lab Results  Component Value Date   CHOL 116 08/08/2020   Lab Results  Component Value Date   HDL 37 (L) 08/08/2020   Lab Results  Component Value Date   LDLCALC 56 08/08/2020   Lab Results  Component Value Date   TRIG 157 (H) 08/08/2020   Lab Results  Component Value Date   CHOLHDL 3.1 08/08/2020   No results found for: HGBA1C    Assessment & Plan:   Problem List Items Addressed This Visit      Cardiovascular and Mediastinum   Essential hypertension    Blood pressure has come down but she continues to complain of cough, I will stop the lisinopril.  As she is taking Micardis.      Chronic venous insufficiency    Patient has dermatitis due to chronic venous insufficiency, she was referred to skin specialist.        Digestive   Acid reflux    Acid reflux is a chronic problem she was advised to take omeprazole, only  drinking 2 hours before sleep, it may be contributing to patient cough.        Musculoskeletal and Integument   Dermatitis    Patient will be referred back to her dermatologist.       Other Visit Diagnoses    Seasonal allergic rhinitis due to pollen    -  Primary   Relevant Orders   Comprehensive metabolic panel   Lipid panel   Urinalysis      No orders of the defined types were placed in this encounter.   Follow-up: No follow-ups on file.    [name], MD

## 2021-01-12 NOTE — Assessment & Plan Note (Signed)
Acid reflux is a chronic problem she was advised to take omeprazole, only drinking 2 hours before sleep, it may be contributing to patient cough.

## 2021-01-12 NOTE — Assessment & Plan Note (Signed)
Patient will be referred back to her dermatologist.

## 2021-01-12 NOTE — Assessment & Plan Note (Signed)
Patient has dermatitis due to chronic venous insufficiency, she was referred to skin specialist.

## 2021-01-12 NOTE — Patient Instructions (Signed)
https://www.nhlbi.nih.gov/files/docs/public/heart/dash_brief.pdf">  Plan de alimentacin DASH DASH Eating Plan DASH es la sigla en ingls de "Enfoques Alimentarios para Detener la Hipertensin". El plan de alimentacin DASH ha demostrado:  Bajar la presin arterial elevada (hipertensin).  Reducir el riesgo de diabetes tipo 2, enfermedad cardaca y accidente cerebrovascular.  Ayudar a perder peso. Consejos para seguir este plan Leer las etiquetas de los alimentos  Verifique la cantidad de sal (sodio) por porcin en las etiquetas de los alimentos. Elija alimentos con menos del 5 por ciento del valor diario de sodio. Generalmente, los alimentos con menos de 300 miligramos (mg) de sodio por porcin se encuadran dentro de este plan alimentario.  Para encontrar cereales integrales, busque la palabra "integral" como primera palabra en la lista de ingredientes. Al ir de compras  Compre productos en los que en su etiqueta diga: "bajo contenido de sodio" o "sin agregado de sal".  Compre alimentos frescos. Evite los alimentos enlatados y comidas precocidas o congeladas. Al cocinar  Evite agregar sal cuando cocine. Use hierbas o aderezos sin sal, en lugar de sal de mesa o sal marina. Consulte al mdico o farmacutico antes de usar sustitutos de la sal.  No fra los alimentos. A la hora de cocinar los alimentos opte por hornearlos, hervirlos, grillarlos, asarlos al horno y asarlos a la parrilla.  Cocine con aceites cardiosaludables, como oliva, canola, aguacate, soja o girasol. Planificacin de las comidas  Consuma una dieta equilibrada, que incluya lo siguiente: ? 4o ms porciones de frutas y 4 o ms porciones de verduras por da. Trate de que medio plato de cada comida sea de frutas y verduras. ? De 6 a 8porciones de cereales integrales todos los das. ? Menos de 6 onzas (170g) de carne, aves o pescado magros por da. Una porcin de 3 onzas (85g) de carne tiene casi el mismo tamao que un  mazo de cartas. Un huevo equivale a 1 onza (28g). ? De 2 a 3 porciones de productos lcteos descremados por da. Una porcin es 1taza (237ml). ? 1 porcin de frutos secos, semillas o frijoles 5 veces por semana. ? De 2 a 3 porciones de grasas cardiosaludables. Las grasas saludables llamadas cidos grasos omega-3 se encuentran en alimentos como las nueces, las semillas de lino, las leches fortificadas y los huevos. Estas grasas tambin se encuentran en los pescados de agua fra, como la sardina, el salmn y la caballa.  Limite la cantidad que consume de: ? Alimentos enlatados o envasados. ? Alimentos con alto contenido de grasa trans, como algunos alimentos fritos. ? Alimentos con alto contenido de grasa saturada, como carne con grasa. ? Postres y otros dulces, bebidas azucaradas y otros alimentos con azcar agregada. ? Productos lcteos enteros.  No le agregue sal a los alimentos antes de probarlos.  No coma ms de 4 yemas de huevo por semana.  Trate de comer al menos 2 comidas vegetarianas por semana.  Consuma ms comida casera y menos de restaurante, de bares y comida rpida.   Estilo de vida  Cuando coma en un restaurante, pida que preparen su comida con menos sal o, en lo posible, sin nada de sal.  Si bebe alcohol: ? Limite la cantidad que bebe:  De 0 a 1 medida por da para las mujeres que no estn embarazadas.  De 0 a 2 medidas por da para los hombres. ? Est atento a la cantidad de alcohol que hay en las bebidas que toma. En [place], una medida equivale a una botella   de cerveza de 12oz (355ml), un vaso de vino de 5oz (148ml) o un vaso de una bebida alcohlica de alta graduacin de 1oz (44ml). Informacin general  Evite ingerir ms de 2300 mg de sal por da. Si tiene hipertensin, es posible que necesite reducir la ingesta de sodio a 1,500 mg por da.  Trabaje con su mdico para mantener un peso saludable o perder peso. Pregntele cul es el peso  recomendado para usted.  Realice al menos 30 minutos de ejercicio que haga que se acelere su corazn (ejercicio aerbico) la mayora de los das de la semana. Estas actividades pueden incluir caminar, nadar o andar en bicicleta.  Trabaje con su mdico o nutricionista para ajustar su plan alimentario a sus necesidades calricas personales. Qu alimentos debo comer? Frutas Todas las frutas frescas, congeladas o disecadas. Frutas enlatadas en jugo natural (sin agregado de azcar). Verduras Verduras frescas o congeladas (crudas, al vapor, asadas o grilladas). Jugos de tomate y verduras con bajo contenido de sodio o reducidos en sodio. Salsa y pasta de tomate con bajo contenido de sodio o reducidas en sodio. Verduras enlatadas con bajo contenido de sodio o reducidas en sodio. Granos Pan de salvado o integral. Pasta de salvado o integral. Arroz integral. Avena. Quinua. Trigo burgol. Cereales integrales y con bajo contenido de sodio. Pan pita. Galletitas de agua con bajo contenido de grasa y sodio. Tortillas de harina integral. Carnes y otras protenas Pollo o pavo sin piel. Carne de pollo o de pavo molida. Cerdo desgrasado. Pescado y mariscos. Claras de huevo. Porotos, guisantes o lentejas secos. Frutos secos, mantequilla de frutos secos y semillas sin sal. Frijoles enlatados sin sal. Cortes de carne vacuna magra, desgrasada. Carne precocida o curada magra y baja en sodio, como embutidos o panes de carne. Lcteos Leche descremada (1%) o descremada. Quesos reducidos en grasa, con bajo contenido de grasa o descremados. Queso blanco o ricota sin grasa, con bajo contenido de sodio. Yogur semidescremado o descremado. Queso con bajo contenido de grasa y sodio. Grasas y aceites Margarinas untables que no contengan grasas trans. Aceite vegetal. Mayonesa y aderezos para ensaladas livianos, reducidos en grasa o con bajo contenido de grasas (reducidos en sodio). Aceite de canola, crtamo, oliva, aguacate, soja y  girasol. Aguacate. Alios y condimentos Hierbas. Especias. Mezclas de condimentos sin sal. Otros alimentos Palomitas de maz y pretzels sin sal. Dulces con bajo contenido de grasas. Es posible que los productos que se enumeran ms arriba no constituyan una lista completa de los alimentos y las bebidas que puede tomar. Consulte a un nutricionista para obtener ms informacin. Qu alimentos debo evitar? Frutas Fruta enlatada en almbar liviano o espeso. Frutas cocidas en aceite. Frutas con salsa de crema o mantequilla. Verduras Verduras con crema o fritas. Verduras en salsa de queso. Verduras enlatadas regulares (que no sean con bajo contenido de sodio o reducidas en sodio). Pasta y salsa de tomates enlatadas regulares (que no sean con bajo contenido de sodio o reducidas en sodio). Jugos de tomate y verduras regulares (que no sean con bajo contenido de sodio o reducidos en sodio). Pepinillos. Aceitunas. Granos Productos de panificacin hechos con grasa, como medialunas, magdalenas y algunos panes. Comidas con arroz o pasta seca listas para usar. Carnes y otras protenas Cortes de carne con alto contenido de grasa. Costillas. Carne frita. Tocino. Mortadela, salame y otras carnes precocidas o curadas, como embutidos o panes de carne. Grasa de la espalda del cerdo (panceta). Salchicha de cerdo. Frutos secos y semillas con sal. Frijoles   enlatados con agregado de sal. Pescado enlatado o ahumado. Huevos enteros o yemas. Pollo o pavo con piel. Lcteos Leche entera o al 2%, crema y mitad leche y mitad crema. Queso crema entero o con toda su grasa. Yogur entero o endulzado. Quesos con toda su grasa. Sustitutos de cremas no lcteas. Coberturas batidas. Quesos para untar y quesos procesados. Grasas y aceites Mantequilla. Margarina en barra. Manteca de cerdo. Lardo. Mantequilla clarificada. Grasa de panceta. Aceites tropicales como aceite de coco, palmiste o palma. Alios y condimentos Sal de cebolla, sal de  ajo, sal condimentada, sal de mesa y sal marina. Salsa Worcestershire. Salsa trtara. Salsa barbacoa. Salsa teriyaki. Salsa de soja, incluso la que tiene contenido reducido de sodio. Salsa de carne. Salsas en lata y envasadas. Salsa de pescado. Salsa de ostras. Salsa rosada. Rbanos picantes comprados en tiendas. [place]up. Mostaza. Saborizantes y tiernizantes para carne. Caldo en cubitos. Salsas picantes. Adobos preelaborados o envasados. Aderezos para tacos preelaborados o envasados. Salsas de pepinillos. Aderezos comunes para ensalada. Otros alimentos Palomitas de maz y pretzels con sal. Es posible que los productos que se enumeran ms arriba no constituyan una lista completa de los alimentos y las bebidas que debe evitar. Consulte a un nutricionista para obtener ms informacin. Dnde buscar ms informacin  National Heart, Lung, and Blood Institute (Instituto Nacional del Corazn, los Pulmones y la Sangre): www.nhlbi.nih.gov  American Heart Association (Asociacin Estadounidense del Corazn): www.heart.org  Academy of Nutrition and Dietetics (Academia de Nutricin y Diettica): www.eatright.org  National Kidney Foundation (Fundacin Nacional del Rin): www.kidney.org Resumen  El plan de alimentacin DASH ha demostrado bajar la presin arterial elevada (hipertensin). Tambin puede reducir el riesgo de diabetes tipo 2, enfermedad cardaca y accidente cerebrovascular.  Cuando siga el plan de alimentacin DASH, trate de comer ms frutas frescas y verduras, cereales integrales, carnes magras, lcteos descremados y grasas cardiosaludables.  Con el plan de alimentacin DASH, deber limitar el consumo de sal (sodio) a 2,300 mg por da. Si tiene hipertensin, es posible que necesite reducir la ingesta de sodio a 1,500 mg por da.  Trabaje con su mdico o nutricionista para ajustar su plan alimentario a sus necesidades calricas personales. Esta informacin no tiene como fin reemplazar el consejo  del mdico. Asegrese de hacerle al mdico cualquier pregunta que tenga. Document Revised: 09/20/2019 Document Reviewed: 09/20/2019 Elsevier Patient Education  2021 Elsevier Inc.  

## 2021-01-16 ENCOUNTER — Ambulatory Visit
Admission: RE | Admit: 2021-01-16 | Discharge: 2021-01-16 | Disposition: A | Discharge status: Home / Self Care | Payer: PPO | Source: Ambulatory Visit | Attending: Internal Medicine | Admitting: Internal Medicine

## 2021-01-16 ENCOUNTER — Other Ambulatory Visit: Payer: Self-pay

## 2021-01-16 ENCOUNTER — Other Ambulatory Visit: Payer: Self-pay | Admitting: Internal Medicine

## 2021-01-16 DIAGNOSIS — Z139 Encounter for screening, unspecified: Secondary | ICD-10-CM

## 2021-01-16 DIAGNOSIS — Z1231 Encounter for screening mammogram for malignant neoplasm of breast: Secondary | ICD-10-CM | POA: Diagnosis not present

## 2021-01-21 ENCOUNTER — Other Ambulatory Visit: Payer: Self-pay | Admitting: Internal Medicine

## 2021-01-21 DIAGNOSIS — R928 Other abnormal and inconclusive findings on diagnostic imaging of breast: Secondary | ICD-10-CM

## 2021-02-15 ENCOUNTER — Other Ambulatory Visit: Payer: Self-pay | Admitting: Internal Medicine

## 2021-02-18 ENCOUNTER — Ambulatory Visit
Admission: RE | Admit: 2021-02-18 | Discharge: 2021-02-18 | Disposition: A | Discharge status: Home / Self Care | Payer: PPO | Source: Ambulatory Visit | Attending: Internal Medicine | Admitting: Internal Medicine

## 2021-02-18 ENCOUNTER — Other Ambulatory Visit: Payer: Self-pay

## 2021-02-18 ENCOUNTER — Ambulatory Visit: Payer: PPO

## 2021-02-18 DIAGNOSIS — R928 Other abnormal and inconclusive findings on diagnostic imaging of breast: Secondary | ICD-10-CM

## 2021-02-18 DIAGNOSIS — R922 Inconclusive mammogram: Secondary | ICD-10-CM | POA: Diagnosis not present

## 2021-03-09 ENCOUNTER — Telehealth: Payer: Self-pay

## 2021-03-09 NOTE — Telephone Encounter (Signed)
-----   Message from Philip Aspen, [place] sent at 03/09/2021  4:30 PM EDT ----- This pt has an appointment with me tomorrow . She used to see Amanda Cruz. Dr. Marcelline Mates managed her pessary. I do not do those. Please call and find out what this appointment is for  Thanks,  Deneise Lever

## 2021-03-09 NOTE — Telephone Encounter (Signed)
LVM with pt instructing to call back in regards to appt. Tomorrow with Amanda Cruz.

## 2021-03-10 ENCOUNTER — Encounter: Payer: Self-pay | Admitting: Obstetrics and Gynecology

## 2021-03-10 ENCOUNTER — Encounter: Payer: PPO | Admitting: Certified Nurse Midwife

## 2021-03-10 ENCOUNTER — Other Ambulatory Visit: Payer: Self-pay

## 2021-03-10 ENCOUNTER — Ambulatory Visit: Payer: PPO | Admitting: Obstetrics and Gynecology

## 2021-03-10 VITALS — BP 156/75 | HR 101 | Ht 63.0 in | Wt 161.7 lb

## 2021-03-10 DIAGNOSIS — N8189 Other female genital prolapse: Secondary | ICD-10-CM | POA: Diagnosis not present

## 2021-03-10 DIAGNOSIS — N950 Postmenopausal bleeding: Secondary | ICD-10-CM | POA: Diagnosis not present

## 2021-03-10 DIAGNOSIS — N952 Postmenopausal atrophic vaginitis: Secondary | ICD-10-CM

## 2021-03-10 NOTE — Progress Notes (Signed)
HPI:      [name] is a 75 y.o. No obstetric history on file. who LMP was No LMP recorded. Patient is postmenopausal.  Subjective:   She presents today after not being seen at [facility] in several years.  She previously had anterior repair surgery with sling but says that it "never worked right".  She had a pessary for a short time but decided that it was more trouble than it was worth and she had pelvic pressure from it. A few years ago she took it out and has just function without it.  She does report that recently her symptoms of vaginal pressure and something bulging from the vagina have gotten worse.  She also states that 1 month ago she began to have vaginal bleeding/spotting. She says that surgery is not an option for her because she is the primary caregiver for her husband and cannot take the time. She reports no significant leakage of urine.    Hx: The following portions of the patient's history were reviewed and updated as appropriate:             She  has a past medical history of GERD (gastroesophageal reflux disease), Hypertension, and Wears dentures. She does not have any pertinent problems on file. She  has a past surgical history that includes Tear duct probing (03/10/2015); Tubal ligation; Colonoscopy with propofol (N/A, 05/09/2018); Cataract extraction w/PHACO (Left, 08/22/2019); Cataract extraction w/PHACO (Right, 09/19/2019); Eye surgery; and Esophagogastroduodenoscopy (egd) with propofol (N/A, 02/05/2020). Her family history includes Heart failure in her mother; Stroke in her mother; Thyroid disease in her paternal grandfather. She  reports that she has never smoked. She has never used smokeless tobacco. She reports previous alcohol use of about 1.0 standard drink of alcohol per week. She reports that she does not use drugs. She has a current medication list which includes the following prescription(s): albuterol, aspirin, atorvastatin, biotin w/ vitamins c & e, vitamin d3,  hydrocodone-homatropine, metoprolol succinate, metoprolol tartrate, mirabegron er, meclizine, and telmisartan-hydrochlorothiazide. She has No Known Allergies.       Review of Systems:  Review of Systems  Constitutional: Denied constitutional symptoms, night sweats, recent illness, fatigue, fever, insomnia and weight loss.  Eyes: Denied eye symptoms, eye pain, photophobia, vision change and visual disturbance.  Ears/Nose/Throat/Neck: Denied ear, nose, throat or neck symptoms, hearing loss, nasal discharge, sinus congestion and sore throat.  Cardiovascular: Denied cardiovascular symptoms, arrhythmia, chest pain/pressure, edema, exercise intolerance, orthopnea and palpitations.  Respiratory: Denied pulmonary symptoms, asthma, pleuritic pain, productive sputum, cough, dyspnea and wheezing.  Gastrointestinal: Denied, gastro-esophageal reflux, melena, nausea and vomiting.  Genitourinary: See HPI for additional information.  Musculoskeletal: Denied musculoskeletal symptoms, stiffness, swelling, muscle weakness and myalgia.  Dermatologic: Denied dermatology symptoms, rash and scar.  Neurologic: Denied neurology symptoms, dizziness, headache, neck pain and syncope.  Psychiatric: Denied psychiatric symptoms, anxiety and depression.  Endocrine: Denied endocrine symptoms including hot flashes and night sweats.   Meds:   Current Outpatient Medications on File Prior to Visit  Medication Sig Dispense Refill   albuterol (VENTOLIN HFA) 108 (90 Base) MCG/ACT inhaler Inhale 2 puffs into the lungs every 6 (six) hours as needed for wheezing or shortness of breath. 8 g 0   ASPIRIN 81 PO Take by mouth every other day.     atorvastatin (LIPITOR) 40 MG tablet Take 1 tablet by mouth once daily 90 tablet 0   Biotin w/ Vitamins C & E (HAIR/SKIN/NAILS PO) Take by mouth daily.  Cholecalciferol (VITAMIN D3) 25 MCG (1000 UT) CAPS Take by mouth daily.     HYDROcodone-homatropine (HYCODAN) 5-1.5 MG/5ML syrup Take 5 mLs  by mouth every 8 (eight) hours as needed for cough. 120 mL 0   metoprolol succinate (TOPROL-XL) 50 MG 24 hr tablet Take 50 mg by mouth.     metoprolol tartrate (LOPRESSOR) 50 MG tablet Take 1 tablet by mouth once daily 90 tablet 0   mirabegron ER (MYRBETRIQ) 25 MG TB24 tablet Take 1 tablet (25 mg total) by mouth daily. 30 tablet 6   meclizine (ANTIVERT) 12.5 MG tablet Take 1 tablet (12.5 mg total) by mouth 3 (three) times daily as needed for dizziness. (Patient not taking: Reported on 03/10/2021) 30 tablet 0   telmisartan-hydrochlorothiazide (MICARDIS HCT) 80-12.5 MG tablet Take 1 tablet by mouth once daily (Patient not taking: Reported on 03/10/2021) 30 tablet 0   No current facility-administered medications on file prior to visit.          Objective:     Vitals:   03/10/21 1340  BP: (!) 156/75  Pulse: (!) 101   Filed Weights   03/10/21 1340  Weight: 161 lb 11.2 oz (73.3 kg)              Physical examination   Pelvic:   Vulva: Normal appearance.  No lesions.  Vagina: No lesions or abnormalities noted.  Support: Fourth degree pelvic prolapse-uterine prolapse  Urethra No masses tenderness or scarring.  Meatus Normal size without lesions or prolapse.  Cervix: Normal appearance.  No lesions.  Slightly irritated appearing spot on the cervix and adjacent vagina consistent with the part that prolapses when the patient is standing.  Anus: Normal exam.  No lesions.  Perineum: Normal exam.  No lesions.        Bimanual   Uterus: Normal size.  Non-tender.  Mobile.  AV.  Adnexae: No masses.  Non-tender to palpation.  Cul-de-sac: Negative for abnormality.     Assessment:    No obstetric history on file. Patient Active Problem List   Diagnosis Date Noted   Cough 12/05/2020   Shortness of breath 11/06/2020   Dizziness 07/01/2020   Sciatica of left side 06/13/2020   Claudication ([place]) 06/13/2020   COVID-19 virus infection 05/29/2020   Dermatitis 04/17/2020   Acute gastritis  without hemorrhage    Varicose veins of bilateral lower extremities with other complications 75/17/0017   Chronic venous insufficiency 08/07/2018   Hyperlipidemia 08/07/2018   Annual physical exam    Polyp of sigmoid colon    Pessary maintenance 04/24/2015   Cystocele with uterine prolapse 04/24/2015   Essential hypertension 03/13/2015   Acid reflux 03/13/2015     1. Postmenopausal bleeding   2. Pelvic relaxation disorder   3. Atrophic vaginitis     Bleeding likely secondary to atrophic vaginitis and irritation from prolapse.  Previous vaginal repair surgery with sling   Patient states she is not a candidate for surgery because of her current lifestyle as caregiver.  She is interested in trying the pessary again.   Plan:            1.  Pelvic ultrasound for endometrial thickness  2.  Patient to present for ultrasound follow-up and at that visit we will perform a pessary fitting.  Patient will bring her old pessary and we will attempt to use that first.  3.  Use of estrogen vaginal cream with pessary for the first several weeks. Orders Orders Placed This Encounter  Procedures  US PELVIS (TRANSABDOMINAL ONLY)   US PELVIS TRANSVAGINAL NON-OB (TV ONLY)    No orders of the defined types were placed in this encounter.     F/U  Return in about 4 weeks (around 04/07/2021). I spent 32 minutes involved in the care of this patient preparing to see the patient by obtaining and reviewing her medical history (including labs, imaging tests and prior procedures), documenting clinical information in the electronic health record (EHR), counseling and coordinating care plans, writing and sending prescriptions, ordering tests or procedures and directly communicating with the patient by discussing pertinent items from her history and physical exam as well as detailing my assessment and plan as noted above so that she has an informed understanding.  All of her questions were answered.  Finis Bud, M.D. 03/10/2021 2:17 PM

## 2021-03-18 ENCOUNTER — Ambulatory Visit
Admission: RE | Admit: 2021-03-18 | Discharge: 2021-03-18 | Disposition: A | Discharge status: Home / Self Care | Payer: PPO | Source: Ambulatory Visit | Attending: Obstetrics and Gynecology | Admitting: Obstetrics and Gynecology

## 2021-03-18 ENCOUNTER — Other Ambulatory Visit: Payer: Self-pay

## 2021-03-18 DIAGNOSIS — D259 Leiomyoma of uterus, unspecified: Secondary | ICD-10-CM | POA: Diagnosis not present

## 2021-03-18 DIAGNOSIS — N950 Postmenopausal bleeding: Secondary | ICD-10-CM | POA: Diagnosis not present

## 2021-03-26 ENCOUNTER — Other Ambulatory Visit: Payer: Self-pay | Admitting: Internal Medicine

## 2021-04-07 ENCOUNTER — Other Ambulatory Visit: Payer: Self-pay

## 2021-04-07 ENCOUNTER — Encounter: Payer: Self-pay | Admitting: Obstetrics and Gynecology

## 2021-04-07 ENCOUNTER — Encounter: Payer: PPO | Admitting: Obstetrics and Gynecology

## 2021-04-07 ENCOUNTER — Ambulatory Visit: Payer: PPO | Admitting: Obstetrics and Gynecology

## 2021-04-07 VITALS — BP 126/63 | HR 68 | Ht 63.0 in | Wt 162.3 lb

## 2021-04-07 DIAGNOSIS — N813 Complete uterovaginal prolapse: Secondary | ICD-10-CM | POA: Diagnosis not present

## 2021-04-07 DIAGNOSIS — R339 Retention of urine, unspecified: Secondary | ICD-10-CM | POA: Diagnosis not present

## 2021-04-07 NOTE — Progress Notes (Addendum)
HPI:      Amanda Cruz is a 75 y.o. No obstetric history on file. who LMP was No LMP recorded. Patient is postmenopausal.  Subjective:   She presents today stating that over the last year she has noticed that her prolapse (uterine) has become worse.  This occasionally affects her ability to urinate.  She says that she is having trouble emptying recently.  She also states this prolapse seems to be affecting her ability to have bowel movements. She says that she was on a blood pressure medicine that caused a chronic cough and she was switched.  She also takes allergy medication to prevent coughing. She previously had a pessary but she stopped using it. 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    Hx: The following portions of the patient's history were reviewed and updated as appropriate:             She  has a past medical history of GERD (gastroesophageal reflux disease), Hypertension, and Wears dentures. She does not have any pertinent problems on file. She  has a past surgical history that includes Tear duct probing (03/10/2015); Tubal ligation; Colonoscopy with propofol (N/A, 05/09/2018); Cataract extraction w/PHACO (Left, 08/22/2019); Cataract extraction w/PHACO (Right, 09/19/2019); Eye surgery; and Esophagogastroduodenoscopy (egd) with propofol (N/A, 02/05/2020). Her family history includes Heart failure in her mother; Stroke in her mother; Thyroid disease in her paternal grandfather. She  reports that she has never smoked. She has never used smokeless tobacco. She reports previous alcohol use of about 1.0 standard drink of alcohol per week. She reports that she does not use drugs. She has a current medication list which includes the following prescription(s): albuterol, aspirin, atorvastatin, biotin w/ vitamins c & e, vitamin d3, hydrocodone-homatropine, metoprolol succinate, metoprolol tartrate, mirabegron er, and  telmisartan-hydrochlorothiazide. She has No Known Allergies.       Review of Systems:  Review of Systems  Constitutional: Denied constitutional symptoms, night sweats, recent illness, fatigue, fever, insomnia and weight loss.  Eyes: Denied eye symptoms, eye pain, photophobia, vision change and visual disturbance.  Ears/Nose/Throat/Neck: Denied ear, nose, throat or neck symptoms, hearing loss, nasal discharge, sinus congestion and sore throat.  Cardiovascular: Denied cardiovascular symptoms, arrhythmia, chest pain/pressure, edema, exercise intolerance, orthopnea and palpitations.  Respiratory: Denied pulmonary symptoms, asthma, pleuritic pain, productive sputum, cough, dyspnea and wheezing.  Gastrointestinal: Denied, gastro-esophageal reflux, melena, nausea and vomiting.  Genitourinary: See HPI for additional information.  Musculoskeletal: Denied musculoskeletal symptoms, stiffness, swelling, muscle weakness and myalgia.  Dermatologic: Denied dermatology symptoms, rash and scar.  Neurologic: Denied neurology symptoms, dizziness, headache, neck pain and syncope.  Psychiatric: Denied psychiatric symptoms, anxiety and depression.  Endocrine: Denied endocrine symptoms including hot flashes and night sweats.   Meds:   Current Outpatient Medications on File Prior to Visit  Medication Sig Dispense Refill   albuterol (VENTOLIN HFA) 108 (90 Base) MCG/ACT inhaler Inhale 2 puffs into the lungs every 6 (six) hours as needed for wheezing or shortness of breath. 8 g 0   ASPIRIN 81 PO Take by mouth every other day.     atorvastatin (LIPITOR) 40 MG tablet Take 1 tablet by mouth once daily 90 tablet 0   Biotin w/ Vitamins C & E (HAIR/SKIN/NAILS PO) Take by mouth daily.     Cholecalciferol (VITAMIN D3) 25 MCG (1000 UT) CAPS Take by mouth daily.     HYDROcodone-homatropine (HYCODAN) 5-1.5 MG/5ML syrup Take  5 mLs by mouth every 8 (eight) hours as needed for cough. 120 mL 0   metoprolol succinate (TOPROL-XL)  50 MG 24 hr tablet Take 50 mg by mouth.     metoprolol tartrate (LOPRESSOR) 50 MG tablet Take 1 tablet by mouth once daily 90 tablet 0   mirabegron ER (MYRBETRIQ) 25 MG TB24 tablet Take 1 tablet (25 mg total) by mouth daily. 30 tablet 6   telmisartan-hydrochlorothiazide (MICARDIS HCT) 80-12.5 MG tablet Take 1 tablet by mouth once daily 30 tablet 0   No current facility-administered medications on file prior to visit.      Objective:     Vitals:   04/07/21 1421  BP: 126/63  Pulse: 68   Filed Weights   04/07/21 1421  Weight: 162 lb 4.8 oz (73.6 kg)              Physical examination   Pelvic:   Vulva: Normal appearance.  No lesions.  Vagina: No lesions or abnormalities noted.  Support: Pelvic prolapse -fourth-degree uterine descensus third-degree rectocele and cystocele.  Some erosion of the cervix noted from the prolapse.  Urethra No masses tenderness or scarring.  Meatus Normal size without lesions or prolapse.  Cervix: Normal appearance.  No lesions.  Anus: Normal exam.  No lesions.  Perineum: Normal exam.  No lesions.        Bimanual   Uterus: Normal size.  Non-tender.  Mobile.  AV.  Adnexae: No masses.  Non-tender to palpation.  Cul-de-sac: Negative for abnormality.   Recent pelvic ultrasound reveals normal pelvis.  Assessment:    No obstetric history on file. Patient Active Problem List   Diagnosis Date Noted   Cough 12/05/2020   Shortness of breath 11/06/2020   Dizziness 07/01/2020   Sciatica of left side 06/13/2020   Claudication ([place]) 06/13/2020   COVID-19 virus infection 05/29/2020   Dermatitis 04/17/2020   Acute gastritis without hemorrhage    Varicose veins of bilateral lower extremities with other complications 123XX123   Chronic venous insufficiency 08/07/2018   Hyperlipidemia 08/07/2018   Annual physical exam    Polyp of sigmoid colon    Pessary maintenance 04/24/2015   Cystocele with uterine prolapse 04/24/2015   Essential hypertension  03/13/2015   Acid reflux 03/13/2015     1. Complete uterovaginal prolapse   2. Urinary retention     Patient's chronic cough, her history of pelvic prolapse, and her recent care of her disabled husband has likely worsened her prolapse.   Plan:            1.  We have discussed prolapse in detail.  I have given her the option of pessary versus surgery.  She says this is not a good time for surgery because of her care of her husband.  She would like to try pessary use again. --- We will schedule for pessary fitting  2.  Patient unable to void but I have requested a urinalysis with culture. Orders No orders of the defined types were placed in this encounter.   No orders of the defined types were placed in this encounter.     F/U  Return in about 2 weeks (around 04/21/2021). I spent 32 minutes involved in the care of this patient preparing to see the patient by obtaining and reviewing her medical history (including labs, imaging tests and prior procedures), documenting clinical information in the electronic health record (EHR), counseling and coordinating care plans, writing and sending prescriptions, ordering tests or procedures and in  direct communicating with the patient and medical staff discussing pertinent items from her history and physical exam.  Finis Bud, M.D. 04/07/2021 3:04 PM

## 2021-04-07 NOTE — Progress Notes (Signed)
Pt present for follow up. . Pt c/o pain in lower abd area x 2 weeks. Pt stated having pain and burning with urination but is unable to void.

## 2021-04-07 NOTE — Addendum Note (Signed)
Addended by: Finis Bud on: 04/07/2021 03:16 PM   Modules accepted: Level of Service

## 2021-04-13 ENCOUNTER — Other Ambulatory Visit: Payer: Self-pay | Admitting: Internal Medicine

## 2021-04-14 ENCOUNTER — Other Ambulatory Visit: Payer: Self-pay | Admitting: Internal Medicine

## 2021-04-21 ENCOUNTER — Other Ambulatory Visit: Payer: Self-pay

## 2021-04-21 ENCOUNTER — Ambulatory Visit: Payer: PPO | Admitting: Obstetrics and Gynecology

## 2021-04-21 ENCOUNTER — Encounter: Payer: Self-pay | Admitting: Obstetrics and Gynecology

## 2021-04-21 VITALS — BP 171/80 | HR 78 | Ht 62.0 in | Wt 159.6 lb

## 2021-04-21 DIAGNOSIS — N813 Complete uterovaginal prolapse: Secondary | ICD-10-CM

## 2021-04-21 DIAGNOSIS — N8189 Other female genital prolapse: Secondary | ICD-10-CM

## 2021-04-21 DIAGNOSIS — N952 Postmenopausal atrophic vaginitis: Secondary | ICD-10-CM | POA: Diagnosis not present

## 2021-04-21 DIAGNOSIS — R339 Retention of urine, unspecified: Secondary | ICD-10-CM | POA: Diagnosis not present

## 2021-04-21 DIAGNOSIS — M545 Low back pain, unspecified: Secondary | ICD-10-CM | POA: Diagnosis not present

## 2021-04-21 NOTE — Progress Notes (Signed)
HPI:      [name] is a 75 y.o. No obstetric history on file. who LMP was No LMP recorded. Patient is postmenopausal.  Subjective:   She presents today because she says that she has decided upon surgery and no longer desires a pessary.  She says that her husband has been in and out of hospice and that she is not required to lift him.  She also states that September is a good time for her to have surgery because her daughter is not working at this time.  She has been experiencing increasing back pain and pelvic pain.  She also states her prolapse becomes significantly more severe when she has bowel movements.  She has previously had urinary incontinence and urinary retention alternatively.      Hx: The following portions of the patient's history were reviewed and updated as appropriate:             She  has a past medical history of GERD (gastroesophageal reflux disease), Hypertension, and Wears dentures. She does not have any pertinent problems on file. She  has a past surgical history that includes Tear duct probing (03/10/2015); Tubal ligation; Colonoscopy with propofol (N/A, 05/09/2018); Cataract extraction w/PHACO (Left, 08/22/2019); Cataract extraction w/PHACO (Right, 09/19/2019); Eye surgery; and Esophagogastroduodenoscopy (egd) with propofol (N/A, 02/05/2020). Her family history includes Heart failure in her mother; Stroke in her mother; Thyroid disease in her paternal grandfather. She  reports that she has never smoked. She has never used smokeless tobacco. She reports that she does not currently use alcohol after a past usage of about 1.0 standard drink per week. She reports that she does not use drugs. She has a current medication list which includes the following prescription(s): albuterol, aspirin, atorvastatin, biotin w/ vitamins c & e, vitamin d3, metoprolol succinate, and telmisartan-hydrochlorothiazide. She has No Known Allergies.       Review of Systems:  Review of  Systems  Constitutional: Denied constitutional symptoms, night sweats, recent illness, fatigue, fever, insomnia and weight loss.  Eyes: Denied eye symptoms, eye pain, photophobia, vision change and visual disturbance.  Ears/Nose/Throat/Neck: Denied ear, nose, throat or neck symptoms, hearing loss, nasal discharge, sinus congestion and sore throat.  Cardiovascular: Denied cardiovascular symptoms, arrhythmia, chest pain/pressure, edema, exercise intolerance, orthopnea and palpitations.  Respiratory: Denied pulmonary symptoms, asthma, pleuritic pain, productive sputum, cough, dyspnea and wheezing.  Gastrointestinal: Denied, gastro-esophageal reflux, melena, nausea and vomiting.  Genitourinary: See HPI for additional information.  Musculoskeletal: Denied musculoskeletal symptoms, stiffness, swelling, muscle weakness and myalgia.  Dermatologic: Denied dermatology symptoms, rash and scar.  Neurologic: Denied neurology symptoms, dizziness, headache, neck pain and syncope.  Psychiatric: Denied psychiatric symptoms, anxiety and depression.  Endocrine: Denied endocrine symptoms including hot flashes and night sweats.   Meds:   Current Outpatient Medications on File Prior to Visit  Medication Sig Dispense Refill   albuterol (VENTOLIN HFA) 108 (90 Base) MCG/ACT inhaler Inhale 2 puffs into the lungs every 6 (six) hours as needed for wheezing or shortness of breath. 8 g 0   ASPIRIN 81 PO Take by mouth every other day.     atorvastatin (LIPITOR) 40 MG tablet Take 1 tablet by mouth once daily 90 tablet 0   Biotin w/ Vitamins C & E (HAIR/SKIN/NAILS PO) Take by mouth daily.     Cholecalciferol (VITAMIN D3) 25 MCG (1000 UT) CAPS Take by mouth daily.     metoprolol succinate (TOPROL-XL) 50 MG 24 hr tablet Take 50 mg by mouth.  telmisartan-hydrochlorothiazide (MICARDIS HCT) 80-12.5 MG tablet Take 1 tablet by mouth once daily 30 tablet 0   No current facility-administered medications on file prior to visit.       Objective:     Vitals:   04/21/21 1409 04/21/21 1411  BP: (!) 186/82 (!) 171/80  Pulse: 84 78   Filed Weights   04/21/21 1409  Weight: 159 lb 9.6 oz (72.4 kg)                        Assessment:    No obstetric history on file. Patient Active Problem List   Diagnosis Date Noted   Cough 12/05/2020   Shortness of breath 11/06/2020   Dizziness 07/01/2020   Sciatica of left side 06/13/2020   Claudication ([place]) 06/13/2020   COVID-19 virus infection 05/29/2020   Dermatitis 04/17/2020   Acute gastritis without hemorrhage    Varicose veins of bilateral lower extremities with other complications 123XX123   Chronic venous insufficiency 08/07/2018   Hyperlipidemia 08/07/2018   Annual physical exam    Polyp of sigmoid colon    Pessary maintenance 04/24/2015   Cystocele with uterine prolapse 04/24/2015   Essential hypertension 03/13/2015   Acid reflux 03/13/2015     1. Complete uterovaginal prolapse   2. Urinary retention   3. Pelvic relaxation disorder   4. Atrophic vaginitis        Plan:            1.  TVH A&P repair TOT  Patient to return for preop.  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 She will need COVID testing on Friday before her surgery She needs to begin Flagyl the day of her preop appointment. Orders No orders of the defined types were placed in this encounter.   No orders of the defined types were placed in this encounter.     F/U  Return in 15 days (on 05/06/2021). I spent 25 minutes involved in the care of this patient preparing to see the patient by obtaining and reviewing her medical history (including labs, imaging tests and prior procedures), documenting clinical information in the electronic health record (EHR), counseling and coordinating care plans, writing and sending prescriptions, ordering tests or procedures and in direct communicating with the patient and medical staff  discussing pertinent items from her history and physical exam.  Finis Bud, M.D. 04/21/2021 2:56 PM

## 2021-04-22 LAB — URINALYSIS, ROUTINE W REFLEX MICROSCOPIC
Bilirubin, UA: NEGATIVE
Glucose, UA: NEGATIVE
Ketones, UA: NEGATIVE
Nitrite, UA: NEGATIVE
Protein,UA: NEGATIVE
RBC, UA: NEGATIVE
Specific Gravity, UA: 1.014 (ref 1.005–1.030)
Urobilinogen, Ur: 0.2 mg/dL (ref 0.2–1.0)
pH, UA: 6.5 (ref 5.0–7.5)

## 2021-04-22 LAB — MICROSCOPIC EXAMINATION
Bacteria, UA: NONE SEEN
Casts: NONE SEEN /LPF

## 2021-04-23 LAB — URINE CULTURE

## 2021-04-24 MED ORDER — NITROFURANTOIN MONOHYD MACRO 100 MG PO CAPS: 100.0000 mg | ORAL_CAPSULE | Freq: Two times a day (BID) | ORAL | 0 refills | Status: DC

## 2021-04-24 NOTE — Addendum Note (Signed)
Addended by: Chilton Greathouse on: 04/24/2021 09:34 AM   Modules accepted: Orders

## 2021-05-06 ENCOUNTER — Ambulatory Visit: Payer: PPO | Admitting: Obstetrics and Gynecology

## 2021-05-06 ENCOUNTER — Other Ambulatory Visit: Payer: Self-pay

## 2021-05-06 ENCOUNTER — Encounter: Payer: Self-pay | Admitting: Obstetrics and Gynecology

## 2021-05-06 VITALS — BP 166/73 | HR 81 | Ht 62.0 in | Wt 158.0 lb

## 2021-05-06 DIAGNOSIS — R339 Retention of urine, unspecified: Secondary | ICD-10-CM | POA: Diagnosis not present

## 2021-05-06 DIAGNOSIS — N813 Complete uterovaginal prolapse: Secondary | ICD-10-CM | POA: Diagnosis not present

## 2021-05-06 DIAGNOSIS — Z01818 Encounter for other preprocedural examination: Secondary | ICD-10-CM | POA: Diagnosis not present

## 2021-05-06 MED ORDER — METRONIDAZOLE 500 MG PO TABS: 500.0000 mg | ORAL_TABLET | Freq: Two times a day (BID) | ORAL | 0 refills | Status: DC

## 2021-05-06 NOTE — H&P (View-Only) (Signed)
PRE-OPERATIVE HISTORY AND PHYSICAL EXAM  PCP:  [name], MD Subjective:   HPI:  Amanda Cruz is a 75 y.o. No obstetric history on file..  No LMP recorded. Patient is postmenopausal.  She presents today for a pre-op discussion and PE.  She has the following symptoms: Complete pelvic prolapse, pelvic discomfort.  Review of Systems:   Constitutional: Denied constitutional symptoms, night sweats, recent illness, fatigue, fever, insomnia and weight loss.  Eyes: Denied eye symptoms, eye pain, photophobia, vision change and visual disturbance.  Ears/Nose/Throat/Neck: Denied ear, nose, throat or neck symptoms, hearing loss, nasal discharge, sinus congestion and sore throat.  Cardiovascular: Denied cardiovascular symptoms, arrhythmia, chest pain/pressure, edema, exercise intolerance, orthopnea and palpitations.  Respiratory: Denied pulmonary symptoms, asthma, pleuritic pain, productive sputum, cough, dyspnea and wheezing.  Gastrointestinal: Denied, gastro-esophageal reflux, melena, nausea and vomiting.  Genitourinary: See HPI for additional information.  Musculoskeletal: Denied musculoskeletal symptoms, stiffness, swelling, muscle weakness and myalgia.  Dermatologic: Denied dermatology symptoms, rash and scar.  Neurologic: Denied neurology symptoms, dizziness, headache, neck pain and syncope.  Psychiatric: Denied psychiatric symptoms, anxiety and depression.  Endocrine: Denied endocrine symptoms including hot flashes and night sweats.   OB History  No obstetric history on file.    Past Medical History:  Diagnosis Date   GERD (gastroesophageal reflux disease)    Hypertension    Wears dentures    full upper and lower    Past Surgical History:  Procedure Laterality Date   CATARACT EXTRACTION W/PHACO Left 08/22/2019   Procedure: CATARACT EXTRACTION PHACO AND INTRAOCULAR LENS PLACEMENT (IOC) LEFT  9.98  01:10.4  14.2%;  Surgeon: Leandrew Koyanagi, MD;  Location: [place];  Service: Ophthalmology;  Laterality: Left;   CATARACT EXTRACTION W/PHACO Right 09/19/2019   Procedure: CATARACT EXTRACTION PHACO AND INTRAOCULAR LENS PLACEMENT (IOC) RIGHT PANOPTIX LENS 7.44  00:52.4  14.3%;  Surgeon: Leandrew Koyanagi, MD;  Location: [place];  Service: Ophthalmology;  Laterality: Right;   COLONOSCOPY WITH PROPOFOL N/A 05/09/2018   Procedure: COLONOSCOPY WITH PROPOFOL;  Surgeon: [name], MD;  Location[facility] ENDOSCOPY;  Service: Endoscopy;  Laterality: N/A;   ESOPHAGOGASTRODUODENOSCOPY (EGD) WITH PROPOFOL N/A 02/05/2020   Procedure: ESOPHAGOGASTRODUODENOSCOPY (EGD) WITH PROPOFOL;  Surgeon: [name], MD;  Location: [facility] ENDOSCOPY;  Service: Endoscopy;  Laterality: N/A;   EYE SURGERY     TEAR DUCT PROBING  03/10/2015   TUBAL LIGATION        SOCIAL HISTORY:  Social History   Tobacco Use  Smoking Status Never  Smokeless Tobacco Never   Social History   Substance and Sexual Activity  Alcohol Use Not Currently   Alcohol/week: 1.0 standard drink   Types: 1 Glasses of wine per week    Social History   Substance and Sexual Activity  Drug Use No    Family History  Problem Relation Age of Onset   Stroke Mother    Heart failure Mother    Thyroid disease Paternal Grandfather     ALLERGIES:  Patient has no known allergies.  MEDS:   Current Outpatient Medications on File Prior to Visit  Medication Sig Dispense Refill   albuterol (VENTOLIN HFA) 108 (90 Base) MCG/ACT inhaler Inhale 2 puffs into the lungs every 6 (six) hours as needed for wheezing or shortness of breath. 8 g 0   ASPIRIN 81 PO Take by mouth every other day.     atorvastatin (LIPITOR) 40 MG tablet Take 1 tablet by mouth once daily 90  tablet 0   Biotin w/ Vitamins C & E (HAIR/SKIN/NAILS PO) Take 1 tablet by mouth daily.     Cholecalciferol (VITAMIN D3) 25 MCG (1000 UT) CAPS Take 1,000 Units by mouth daily.     ELDERBERRY PO Take 50 mg by mouth daily.     IRON-VITAMIN  C PO Take 1 tablet by mouth daily.     metoprolol succinate (TOPROL-XL) 50 MG 24 hr tablet Take 50 mg by mouth daily.     OVER THE COUNTER MEDICATION Take 1 capsule by mouth daily. Beets     Propylene Glycol (SYSTANE BALANCE) 0.6 % SOLN Place 1 drop into both eyes daily as needed (dry eyes).     telmisartan-hydrochlorothiazide (MICARDIS HCT) 80-12.5 MG tablet Take 1 tablet by mouth once daily 30 tablet 0   No current facility-administered medications on file prior to visit.    No orders of the defined types were placed in this encounter.    Physical examination BP (!) 166/73   Pulse 81   Ht '5\' 2"'$  (1.575 m)   Wt 158 lb (71.7 kg)   BMI 28.90 kg/m   General NAD, Conversant  HEENT Atraumatic; Op clear with mmm.  Normo-cephalic. Pupils reactive. Anicteric sclerae  Thyroid/Neck Smooth without nodularity or enlargement. Normal ROM.  Neck Supple.  Skin No rashes, lesions or ulceration. Normal palpated skin turgor. No nodularity.  Breasts: No masses or discharge.  Symmetric.  No axillary adenopathy.  Lungs: Clear to auscultation.No rales or wheezes. Normal Respiratory effort, no retractions.  Heart: NSR.  No murmurs or rubs appreciated. No periferal edema  Abdomen: Soft.  Non-tender.  No masses.  No HSM. No hernia  Extremities: Moves all appropriately.  Normal ROM for age. No lymphadenopathy.  Neuro: Oriented to PPT.  Normal mood. Normal affect.     Pelvic:   Vulva: Normal appearance.  No lesions.  Vagina: No lesions or abnormalities noted.  Support: Complete prolapse  Urethra No masses tenderness or scarring.  Meatus Normal size without lesions or prolapse.  Cervix: Normal ectropion.  No lesions.  Anus: Normal exam.  No lesions.  Perineum: Normal exam.  No lesions.        Bimanual   Uterus: Normal size.  Non-tender.  Mobile.  AV.  Adnexae: No masses.  Non-tender to palpation.  Cul-de-sac: Negative for abnormality.   Assessment:   No obstetric history on file. Patient Active  Problem List   Diagnosis Date Noted   Cough 12/05/2020   Shortness of breath 11/06/2020   Dizziness 07/01/2020   Sciatica of left side 06/13/2020   Claudication ([place]) 06/13/2020   COVID-19 virus infection 05/29/2020   Dermatitis 04/17/2020   Acute gastritis without hemorrhage    Varicose veins of bilateral lower extremities with other complications 123XX123   Chronic venous insufficiency 08/07/2018   Hyperlipidemia 08/07/2018   Annual physical exam    Polyp of sigmoid colon    Pessary maintenance 04/24/2015   Cystocele with uterine prolapse 04/24/2015   Essential hypertension 03/13/2015   Acid reflux 03/13/2015    1. Preop examination   2. Complete uterovaginal prolapse   3. Urinary retention    She is not at all interested in pessary-wants definitive management. She understands that the goal is to make the vagina very small to prevent future prolapse if possible.  She does understand that there is a risk of prolapse later in life.  I have explained to her different surgeries including sacrospinous fixation and other ways to fixate the top  of the vagina and maintain its diameter.  She has stated that intercourse is not part of her life. Plan:   Orders: No orders of the defined types were placed in this encounter.

## 2021-05-06 NOTE — H&P (Signed)
PRE-OPERATIVE HISTORY AND PHYSICAL EXAM  PCP:  [name], MD Subjective:   HPI:  Amanda Cruz is a 75 y.o. No obstetric history on file..  No LMP recorded. Patient is postmenopausal.  She presents today for a pre-op discussion and PE.  She has the following symptoms: Complete pelvic prolapse, pelvic discomfort.  Review of Systems:   Constitutional: Denied constitutional symptoms, night sweats, recent illness, fatigue, fever, insomnia and weight loss.  Eyes: Denied eye symptoms, eye pain, photophobia, vision change and visual disturbance.  Ears/Nose/Throat/Neck: Denied ear, nose, throat or neck symptoms, hearing loss, nasal discharge, sinus congestion and sore throat.  Cardiovascular: Denied cardiovascular symptoms, arrhythmia, chest pain/pressure, edema, exercise intolerance, orthopnea and palpitations.  Respiratory: Denied pulmonary symptoms, asthma, pleuritic pain, productive sputum, cough, dyspnea and wheezing.  Gastrointestinal: Denied, gastro-esophageal reflux, melena, nausea and vomiting.  Genitourinary: See HPI for additional information.  Musculoskeletal: Denied musculoskeletal symptoms, stiffness, swelling, muscle weakness and myalgia.  Dermatologic: Denied dermatology symptoms, rash and scar.  Neurologic: Denied neurology symptoms, dizziness, headache, neck pain and syncope.  Psychiatric: Denied psychiatric symptoms, anxiety and depression.  Endocrine: Denied endocrine symptoms including hot flashes and night sweats.   OB History  No obstetric history on file.    Past Medical History:  Diagnosis Date   GERD (gastroesophageal reflux disease)    Hypertension    Wears dentures    full upper and lower    Past Surgical History:  Procedure Laterality Date   CATARACT EXTRACTION W/PHACO Left 08/22/2019   Procedure: CATARACT EXTRACTION PHACO AND INTRAOCULAR LENS PLACEMENT (IOC) LEFT  9.98  01:10.4  14.2%;  Surgeon: Leandrew Koyanagi, MD;  Location: [place];  Service: Ophthalmology;  Laterality: Left;   CATARACT EXTRACTION W/PHACO Right 09/19/2019   Procedure: CATARACT EXTRACTION PHACO AND INTRAOCULAR LENS PLACEMENT (IOC) RIGHT PANOPTIX LENS 7.44  00:52.4  14.3%;  Surgeon: Leandrew Koyanagi, MD;  Location: [place];  Service: Ophthalmology;  Laterality: Right;   COLONOSCOPY WITH PROPOFOL N/A 05/09/2018   Procedure: COLONOSCOPY WITH PROPOFOL;  Surgeon: [name], MD;  Location: [facility] ENDOSCOPY;  Service: Endoscopy;  Laterality: N/A;   ESOPHAGOGASTRODUODENOSCOPY (EGD) WITH PROPOFOL N/A 02/05/2020   Procedure: ESOPHAGOGASTRODUODENOSCOPY (EGD) WITH PROPOFOL;  Surgeon: [name], MD;  Location: [facility] ENDOSCOPY;  Service: Endoscopy;  Laterality: N/A;   EYE SURGERY     TEAR DUCT PROBING  03/10/2015   TUBAL LIGATION        SOCIAL HISTORY:  Social History   Tobacco Use  Smoking Status Never  Smokeless Tobacco Never   Social History   Substance and Sexual Activity  Alcohol Use Not Currently   Alcohol/week: 1.0 standard drink   Types: 1 Glasses of wine per week    Social History   Substance and Sexual Activity  Drug Use No    Family History  Problem Relation Age of Onset   Stroke Mother    Heart failure Mother    Thyroid disease Paternal Grandfather     ALLERGIES:  Patient has no known allergies.  MEDS:   Current Outpatient Medications on File Prior to Visit  Medication Sig Dispense Refill   albuterol (VENTOLIN HFA) 108 (90 Base) MCG/ACT inhaler Inhale 2 puffs into the lungs every 6 (six) hours as needed for wheezing or shortness of breath. 8 g 0   ASPIRIN 81 PO Take by mouth every other day.     atorvastatin (LIPITOR) 40 MG tablet Take 1 tablet by mouth once daily 90  tablet 0   Biotin w/ Vitamins C & E (HAIR/SKIN/NAILS PO) Take 1 tablet by mouth daily.     Cholecalciferol (VITAMIN D3) 25 MCG (1000 UT) CAPS Take 1,000 Units by mouth daily.     ELDERBERRY PO Take 50 mg by mouth daily.     IRON-VITAMIN  C PO Take 1 tablet by mouth daily.     metoprolol succinate (TOPROL-XL) 50 MG 24 hr tablet Take 50 mg by mouth daily.     OVER THE COUNTER MEDICATION Take 1 capsule by mouth daily. Beets     Propylene Glycol (SYSTANE BALANCE) 0.6 % SOLN Place 1 drop into both eyes daily as needed (dry eyes).     telmisartan-hydrochlorothiazide (MICARDIS HCT) 80-12.5 MG tablet Take 1 tablet by mouth once daily 30 tablet 0   No current facility-administered medications on file prior to visit.    No orders of the defined types were placed in this encounter.    Physical examination BP (!) 166/73   Pulse 81   Ht '5\' 2"'$  (1.575 m)   Wt 158 lb (71.7 kg)   BMI 28.90 kg/m   General NAD, Conversant  HEENT Atraumatic; Op clear with mmm.  Normo-cephalic. Pupils reactive. Anicteric sclerae  Thyroid/Neck Smooth without nodularity or enlargement. Normal ROM.  Neck Supple.  Skin No rashes, lesions or ulceration. Normal palpated skin turgor. No nodularity.  Breasts: No masses or discharge.  Symmetric.  No axillary adenopathy.  Lungs: Clear to auscultation.No rales or wheezes. Normal Respiratory effort, no retractions.  Heart: NSR.  No murmurs or rubs appreciated. No periferal edema  Abdomen: Soft.  Non-tender.  No masses.  No HSM. No hernia  Extremities: Moves all appropriately.  Normal ROM for age. No lymphadenopathy.  Neuro: Oriented to PPT.  Normal mood. Normal affect.     Pelvic:   Vulva: Normal appearance.  No lesions.  Vagina: No lesions or abnormalities noted.  Support: Complete prolapse  Urethra No masses tenderness or scarring.  Meatus Normal size without lesions or prolapse.  Cervix: Normal ectropion.  No lesions.  Anus: Normal exam.  No lesions.  Perineum: Normal exam.  No lesions.        Bimanual   Uterus: Normal size.  Non-tender.  Mobile.  AV.  Adnexae: No masses.  Non-tender to palpation.  Cul-de-sac: Negative for abnormality.   Assessment:   No obstetric history on file. 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 Preop examination   2. Complete uterovaginal prolapse   3. Urinary retention    She is not at all interested in pessary-wants definitive management. She understands that the goal is to make the vagina very small to prevent future prolapse if possible.  She does understand that there is a risk of prolapse later in life.  I have explained to her different surgeries including sacrospinous fixation and other ways to fixate the top  of the vagina and maintain its diameter.  She has stated that intercourse is not part of her life. Plan:   Orders: No orders of the defined types were placed in this encounter.

## 2021-05-06 NOTE — Progress Notes (Signed)
PRE-OPERATIVE HISTORY AND PHYSICAL EXAM  PCP:  [name], MD Subjective:   HPI:  Amanda Cruz is a 75 y.o. No obstetric history on file..  No LMP recorded. Patient is postmenopausal.  She presents today for a pre-op discussion and PE.  She has the following symptoms: Complete pelvic prolapse, pelvic discomfort.  Review of Systems:   Constitutional: Denied constitutional symptoms, night sweats, recent illness, fatigue, fever, insomnia and weight loss.  Eyes: Denied eye symptoms, eye pain, photophobia, vision change and visual disturbance.  Ears/Nose/Throat/Neck: Denied ear, nose, throat or neck symptoms, hearing loss, nasal discharge, sinus congestion and sore throat.  Cardiovascular: Denied cardiovascular symptoms, arrhythmia, chest pain/pressure, edema, exercise intolerance, orthopnea and palpitations.  Respiratory: Denied pulmonary symptoms, asthma, pleuritic pain, productive sputum, cough, dyspnea and wheezing.  Gastrointestinal: Denied, gastro-esophageal reflux, melena, nausea and vomiting.  Genitourinary: See HPI for additional information.  Musculoskeletal: Denied musculoskeletal symptoms, stiffness, swelling, muscle weakness and myalgia.  Dermatologic: Denied dermatology symptoms, rash and scar.  Neurologic: Denied neurology symptoms, dizziness, headache, neck pain and syncope.  Psychiatric: Denied psychiatric symptoms, anxiety and depression.  Endocrine: Denied endocrine symptoms including hot flashes and night sweats.   OB History  No obstetric history on file.    Past Medical History:  Diagnosis Date   GERD (gastroesophageal reflux disease)    Hypertension    Wears dentures    full upper and lower    Past Surgical History:  Procedure Laterality Date   CATARACT EXTRACTION W/PHACO Left 08/22/2019   Procedure: CATARACT EXTRACTION PHACO AND INTRAOCULAR LENS PLACEMENT (IOC) LEFT  9.98  01:10.4  14.2%;  Surgeon: Leandrew Koyanagi, MD;  Location: [facility];  Service: Ophthalmology;  Laterality: Left;   CATARACT EXTRACTION W/PHACO Right 09/19/2019   Procedure: CATARACT EXTRACTION PHACO AND INTRAOCULAR LENS PLACEMENT (IOC) RIGHT PANOPTIX LENS 7.44  00:52.4  14.3%;  Surgeon: Leandrew Koyanagi, MD;  Location: [facility];  Service: Ophthalmology;  Laterality: Right;   COLONOSCOPY WITH PROPOFOL N/A 05/09/2018   Procedure: COLONOSCOPY WITH PROPOFOL;  Surgeon: [name], MD;  Location: [facility] ENDOSCOPY;  Service: Endoscopy;  Laterality: N/A;   ESOPHAGOGASTRODUODENOSCOPY (EGD) WITH PROPOFOL N/A 02/05/2020   Procedure: ESOPHAGOGASTRODUODENOSCOPY (EGD) WITH PROPOFOL;  Surgeon: [name], MD;  Location: [facility] ENDOSCOPY;  Service: Endoscopy;  Laterality: N/A;   EYE SURGERY     TEAR DUCT PROBING  03/10/2015   TUBAL LIGATION        SOCIAL HISTORY:  Social History   Tobacco Use  Smoking Status Never  Smokeless Tobacco Never   Social History   Substance and Sexual Activity  Alcohol Use Not Currently   Alcohol/week: 1.0 standard drink   Types: 1 Glasses of wine per week    Social History   Substance and Sexual Activity  Drug Use No    Family History  Problem Relation Age of Onset   Stroke Mother    Heart failure Mother    Thyroid disease Paternal Grandfather     ALLERGIES:  Patient has no known allergies.  MEDS:   Current Outpatient Medications on File Prior to Visit  Medication Sig Dispense Refill   albuterol (VENTOLIN HFA) 108 (90 Base) MCG/ACT inhaler Inhale 2 puffs into the lungs every 6 (six) hours as needed for wheezing or shortness of breath. 8 g 0   ASPIRIN 81 PO Take by mouth every other day.     atorvastatin (LIPITOR) 40 MG tablet Take 1 tablet by mouth once daily 90 tablet  0   Biotin w/ Vitamins C & E (HAIR/SKIN/NAILS PO) Take 1 tablet by mouth daily.     Cholecalciferol (VITAMIN D3) 25 MCG (1000 UT) CAPS Take 1,000 Units by mouth daily.     ELDERBERRY PO Take 50 mg by mouth daily.     IRON-VITAMIN  C PO Take 1 tablet by mouth daily.     metoprolol succinate (TOPROL-XL) 50 MG 24 hr tablet Take 50 mg by mouth daily.     OVER THE COUNTER MEDICATION Take 1 capsule by mouth daily. Beets     Propylene Glycol (SYSTANE BALANCE) 0.6 % SOLN Place 1 drop into both eyes daily as needed (dry eyes).     telmisartan-hydrochlorothiazide (MICARDIS HCT) 80-12.5 MG tablet Take 1 tablet by mouth once daily 30 tablet 0   No current facility-administered medications on file prior to visit.    Meds ordered this encounter  Medications   metroNIDAZOLE (FLAGYL) 500 MG tablet    Sig: Take 1 tablet (500 mg total) by mouth 2 (two) times daily. Begin 5 days prior to scheduled surgery as directed.    Dispense:  10 tablet    Refill:  0     Physical examination BP (!) 166/73   Pulse 81   Ht '5\' 2"'$  (1.575 m)   Wt 158 lb (71.7 kg)   BMI 28.90 kg/m   General NAD, Conversant  HEENT Atraumatic; Op clear with mmm.  Normo-cephalic. Pupils reactive. Anicteric sclerae  Thyroid/Neck Smooth without nodularity or enlargement. Normal ROM.  Neck Supple.  Skin No rashes, lesions or ulceration. Normal palpated skin turgor. No nodularity.  Breasts: No masses or discharge.  Symmetric.  No axillary adenopathy.  Lungs: Clear to auscultation.No rales or wheezes. Normal Respiratory effort, no retractions.  Heart: NSR.  No murmurs or rubs appreciated. No periferal edema  Abdomen: Soft.  Non-tender.  No masses.  No HSM. No hernia  Extremities: Moves all appropriately.  Normal ROM for age. No lymphadenopathy.  Neuro: Oriented to PPT.  Normal mood. Normal affect.     Pelvic:   Vulva: Normal appearance.  No lesions.  Vagina: No lesions or abnormalities noted.  Support: Complete prolapse  Urethra No masses tenderness or scarring.  Meatus Normal size without lesions or prolapse.  Cervix: Normal ectropion.  No lesions.  Anus: Normal exam.  No lesions.  Perineum: Normal exam.  No lesions.        Bimanual   Uterus: Normal size.   Non-tender.  Mobile.  AV.  Adnexae: No masses.  Non-tender to palpation.  Cul-de-sac: Negative for abnormality.   Assessment:   No obstetric history on file. 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 Preop examination   2. Complete uterovaginal prolapse   3. Urinary retention    She is not at all interested in pessary-wants definitive management. She understands that the goal is to make the vagina  very small to prevent future prolapse if possible.  She does understand that there is a risk of prolapse later in life.  I have explained to her different surgeries including sacrospinous fixation and other ways to fixate the top of the vagina and maintain its diameter.  She has stated that intercourse is not part of her life. Plan:   Orders: Meds ordered this encounter  Medications   metroNIDAZOLE (FLAGYL) 500 MG tablet    Sig: Take 1 tablet (500 mg total) by mouth 2 (two) times daily. Begin 5 days prior to scheduled surgery as directed.    Dispense:  10 tablet    Refill:  0     1.  TVH A&P repair TOT  Pre-op discussions regarding Risks and Benefits of her scheduled surgery.  TVH The procedure of total vaginal hysterectomy was explained to the patient in detail.  We reviewed the rationale for hysterectomy and the patient was again informed of other non-surgical management  possibilities for her condition.  She has considered these other options and desires a hysterectomy.  We've reviewed that fact that hysterectomy is a permanent procedure and she will not be able to become pregnant or bear children following it.  We have discussed the following risk factors specifically and the patient has also been informed that additional complications not mentioned may develop;  damage to bowel, bladder, ureters, rectum, other internal organs, bleeding, infection and the risk of anesthesia.  We have discussed the procedure itself in detail and she has an informed understanding of this surgery.  We have also discussed the recovery period in which physical and sexual activity will be restricted for a varying degree of time,  often 3-6 weeks.  I have answered all her questions and I believe she has an informed understanding of total vaginal hysterectomy. Anterior Repair I have discussed the procedure of anterior repair and Kelly placation.  I have informed the patient that this procedure often corrects or improves stress urinary incontinence, but that there is certainly no guarantee of her improvement.  The procedure itself was discussed.  The possible damage to the bowel, ureters or urethra was also discussed.  We have reviewed the repositioning of the bladder that often takes place at anterior repair and I have informed her that although unlikely, it is possible that a worsening of her incontinence could occur after this procedure.  I have also discussed with her the necessity of decreased lifting and physical activity following the procedure as well as the possibility that as she gets older, her stress urinary incontinence could slowly return.  The use of vaginal Estrogen or oral Estrogen as well as other medications in the role of both stress and bladder dysynergia incontinence were discussed.  I have discussed the complication of inability to void immediately following the procedure.  The  patient is aware that she may go home using a Foley catheter or may be taught the technique of self-catheterization should a complication develop.  All of her questions have been answered and I believe that she has an informed understanding of anterior repair/Kelly plication. Posterior Repair Posterior repair was discussed with the patient.  The risks were reviewed and include:  possible damage to rectum and bowel, bleeding, infection and anesthesia.  The benefits were also discussed.  Post-op recovery with special attention to hospital stay and return to sexual function were specifically reviewed.  All her questions were answered, and I believe that she has an informed understanding of Posterior repair.  TOT I have discussed the procedure of tension free vaginal tape using the trans-obturator approach. (TOT).  I have informed the patient that this procedure often corrects or improves stress urinary incontinence.  For patients without urinary incontinence-this procedure is often performed to lower the risk of iatragenic urinary incontinence at the time of cystocele repair.The patient has been made aware that there is no guarantee of her improvement or the length of time her improvement will last.  The procedure itself was discussed in detail including possible damage to bowel, ureters, urethra and bladder.  I have informed her that the mesh is permanent.  The risk of extrusion of the mesh has also been reviewed.  The management of this complication has been discussed.  The risks of bleeding and infection were also reviewed.  I have specifically discussed the complication of inability to void following the procedure and the patient is aware that she may go home using a Foley catheter or using a self-catheterization technique.  In addition, I have discussed with the patient the use of cystoscopy to diagnose bladder injury should there be any question of this.    I spent 35 minutes involved in the care of this  patient preparing to see the patient by obtaining and reviewing her medical history (including labs, imaging tests and prior procedures), documenting clinical information in the electronic health record (EHR), counseling and coordinating care plans, writing and sending prescriptions, ordering tests or procedures and in direct communicating with the patient and medical staff discussing pertinent items from her history and physical exam.   Finis Bud, M.D. 05/06/2021 4:44 PM

## 2021-05-07 ENCOUNTER — Other Ambulatory Visit
Admission: RE | Admit: 2021-05-07 | Discharge: 2021-05-07 | Disposition: A | Discharge status: Home / Self Care | Payer: PPO | Source: Ambulatory Visit | Attending: Obstetrics and Gynecology | Admitting: Obstetrics and Gynecology

## 2021-05-07 DIAGNOSIS — Z01812 Encounter for preprocedural laboratory examination: Secondary | ICD-10-CM | POA: Diagnosis not present

## 2021-05-07 DIAGNOSIS — Z20822 Contact with and (suspected) exposure to covid-19: Secondary | ICD-10-CM | POA: Diagnosis not present

## 2021-05-07 LAB — SARS CORONAVIRUS 2 (TAT 6-24 HRS): SARS Coronavirus 2: NEGATIVE

## 2021-05-08 ENCOUNTER — Other Ambulatory Visit
Admission: RE | Admit: 2021-05-08 | Discharge: 2021-05-08 | Disposition: A | Discharge status: Home / Self Care | Payer: PPO | Source: Ambulatory Visit | Attending: Obstetrics and Gynecology | Admitting: Obstetrics and Gynecology

## 2021-05-08 NOTE — Patient Instructions (Addendum)
Your procedure is scheduled on: 05/11/21 - Monday Report to the Registration Desk on the 1st floor of the [facility]. To find out your arrival time, please call [phone] between 1PM - 3PM on: 05/08/21 Friday  REMEMBER: Instructions that are not followed completely may result in serious medical risk, up to and including death; or upon the discretion of your surgeon and anesthesiologist your surgery may need to be rescheduled.  Do not eat food after midnight the night before surgery.  No gum chewing, lozengers or hard candies.  You may however, drink CLEAR liquids up to 2 hours before you are scheduled to arrive for your surgery. Do not drink anything within 2 hours of your scheduled arrival time.  Clear liquids include: - water  - apple juice without pulp - gatorade (not RED, PURPLE, OR BLUE) - black coffee or tea (Do NOT add milk or creamers to the coffee or tea) Do NOT drink anything that is not on this list.  TAKE THESE MEDICATIONS THE MORNING OF SURGERY WITH A SIP OF WATER:  - metoprolol succinate (TOPROL-XL) 50 MG 24 hr tablet  Use albuterol (VENTOLIN HFA) 108 (90 Base) MCG/ACT inhaler on the day of surgery and bring to the hospital.  Follow recommendations from Cardiologist, Pulmonologist or PCP regarding stopping Aspirin, Coumadin, Plavix, Eliquis, Pradaxa, or Pletal. Stop Asa 81 mg beginning 05/08/21.  One week prior to surgery: Stop Anti-inflammatories (NSAIDS) such as Advil, Aleve, Ibuprofen, Motrin, Naproxen, Naprosyn and Aspirin based products such as Excedrin, Goodys Powder, BC Powder.  Stop ANY OVER THE COUNTER supplements until after surgery.  You may take Tylenol as directed if needed for pain up until the day of surgery.  No Alcohol for 24 hours before or after surgery.  No Smoking including e-cigarettes for 24 hours prior to surgery.  No chewable tobacco products for at least 6 hours prior to surgery.  No nicotine patches on the day of surgery.  Do not  use any "recreational" drugs for at least a week prior to your surgery.  Please be advised that the combination of cocaine and anesthesia may have negative outcomes, up to and including death. If you test positive for cocaine, your surgery will be cancelled.  On the morning of surgery brush your teeth with toothpaste and water, you may rinse your mouth with mouthwash if you wish. Do not swallow any toothpaste or mouthwash.  Do not wear jewelry, make-up, hairpins, clips or nail polish.  Do not wear lotions, powders, or perfumes.   Do not shave body from the neck down 48 hours prior to surgery just in case you cut yourself which could leave a site for infection.  Also, freshly shaved skin may become irritated if using the CHG soap.  Contact lenses, hearing aids and dentures may not be worn into surgery.  Do not bring valuables to the hospital. [facility] is not responsible for any missing/lost belongings or valuables.   Notify your doctor if there is any change in your medical condition (cold, fever, infection).  Wear comfortable clothing (specific to your surgery type) to the hospital.  After surgery, you can help prevent lung complications by doing breathing exercises.  Take deep breaths and cough every 1-2 hours. Your doctor may order a device called an Incentive Spirometer to help you take deep breaths. When coughing or sneezing, hold a pillow firmly against your incision with both hands. This is called "splinting." Doing this helps protect your incision. It also decreases belly discomfort.  If you are being admitted to the hospital overnight, leave your suitcase in the car. After surgery it may be brought to your room.  If you are being discharged the day of surgery, you will not be allowed to drive home. You will need a responsible adult (18 years or older) to drive you home and stay with you that night.   If you are taking public transportation, you will need to have a  responsible adult (18 years or older) with you. Please confirm with your physician that it is acceptable to use public transportation.   Please call the [facility] Dept[facility] at [phone] if you have any questions about these instructions.  Surgery Visitation Policy:  Patients undergoing a surgery or procedure may have one family member or support person with them as long as that person is not COVID-19 positive or experiencing its symptoms.  That person may remain in the waiting area during the procedure.  Inpatient Visitation:    Visiting hours are 7 a.m. to 8 p.m. Inpatients will be allowed two visitors daily. The visitors may change each day during the patient's stay. No visitors under the age of 64. Any visitor under the age of 50 must be accompanied by an adult. The visitor must pass COVID-19 screenings, use hand sanitizer when entering and exiting the patient's room and wear a mask at all times, including in the patient's room. Patients must also wear a mask when staff or their visitor are in the room. Masking is required regardless of vaccination status.

## 2021-05-11 ENCOUNTER — Encounter: Payer: Self-pay | Admitting: Obstetrics and Gynecology

## 2021-05-11 ENCOUNTER — Encounter
Admission: RE | Disposition: A | Discharge status: Home / Self Care | Payer: Self-pay | Source: Home / Self Care | Attending: Obstetrics and Gynecology

## 2021-05-11 ENCOUNTER — Inpatient Hospital Stay: Payer: PPO | Admitting: Certified Registered"

## 2021-05-11 ENCOUNTER — Inpatient Hospital Stay
Admission: RE | Admit: 2021-05-11 | Discharge: 2021-05-13 | DRG: 743 | Disposition: A | Discharge status: Home / Self Care | Payer: PPO | Attending: Obstetrics and Gynecology | Admitting: Obstetrics and Gynecology

## 2021-05-11 ENCOUNTER — Other Ambulatory Visit: Payer: Self-pay

## 2021-05-11 DIAGNOSIS — R339 Retention of urine, unspecified: Secondary | ICD-10-CM | POA: Diagnosis present

## 2021-05-11 DIAGNOSIS — I10 Essential (primary) hypertension: Secondary | ICD-10-CM | POA: Diagnosis not present

## 2021-05-11 DIAGNOSIS — N813 Complete uterovaginal prolapse: Secondary | ICD-10-CM | POA: Diagnosis not present

## 2021-05-11 DIAGNOSIS — N8189 Other female genital prolapse: Secondary | ICD-10-CM | POA: Diagnosis not present

## 2021-05-11 DIAGNOSIS — Z7982 Long term (current) use of aspirin: Secondary | ICD-10-CM

## 2021-05-11 DIAGNOSIS — Z9889 Other specified postprocedural states: Secondary | ICD-10-CM

## 2021-05-11 DIAGNOSIS — Z8616 Personal history of COVID-19: Secondary | ICD-10-CM

## 2021-05-11 HISTORY — PX: VAGINAL HYSTERECTOMY: SHX2639

## 2021-05-11 HISTORY — PX: PUBOVAGINAL SLING: SHX1035

## 2021-05-11 HISTORY — PX: ANTERIOR AND POSTERIOR REPAIR: SHX5121

## 2021-05-11 LAB — BASIC METABOLIC PANEL
Anion gap: 8 (ref 5–15)
BUN: 20 mg/dL (ref 8–23)
CO2: 27 mmol/L (ref 22–32)
Calcium: 8.5 mg/dL — ABNORMAL LOW (ref 8.9–10.3)
Chloride: 101 mmol/L (ref 98–111)
Creatinine, Ser: 0.91 mg/dL (ref 0.44–1.00)
GFR, Estimated: >60 mL/min (ref >60)
Glucose, Bld: 107 mg/dL — ABNORMAL HIGH (ref 70–99)
Potassium: 3.5 mmol/L (ref 3.5–5.1)
Sodium: 136 mmol/L (ref 135–145)

## 2021-05-11 LAB — CBC WITH DIFFERENTIAL/PLATELET
Abs Immature Granulocytes: 0.02 10*3/uL (ref 0.00–0.07)
Basophils Absolute: 0 10*3/uL (ref 0.0–0.1)
Basophils Relative: 0 %
Eosinophils Absolute: 0.2 10*3/uL (ref 0.0–0.5)
Eosinophils Relative: 3 %
HCT: 33 % — ABNORMAL LOW (ref 36.0–46.0)
Hemoglobin: 12.1 g/dL (ref 12.0–15.0)
Immature Granulocytes: 0 %
Lymphocytes Relative: 20 %
Lymphs Abs: 1 10*3/uL (ref 0.7–4.0)
MCH: 31.7 pg (ref 26.0–34.0)
MCHC: 36.7 g/dL — ABNORMAL HIGH (ref 30.0–36.0)
MCV: 86.4 fL (ref 80.0–100.0)
Monocytes Absolute: 0.5 10*3/uL (ref 0.1–1.0)
Monocytes Relative: 9 %
Neutro Abs: 3.4 10*3/uL (ref 1.7–7.7)
Neutrophils Relative %: 68 %
Platelets: 177 10*3/uL (ref 150–400)
RBC: 3.82 MIL/uL — ABNORMAL LOW (ref 3.87–5.11)
RDW: 12.2 % (ref 11.5–15.5)
WBC: 5.1 10*3/uL (ref 4.0–10.5)
nRBC: 0 % (ref 0.0–0.2)

## 2021-05-11 LAB — TYPE AND SCREEN
ABO/RH(D): AB POS
Antibody Screen: NEGATIVE

## 2021-05-11 LAB — ABO/RH: ABO/RH(D): AB POS

## 2021-05-11 SURGERY — HYSTERECTOMY, VAGINAL
Anesthesia: General

## 2021-05-11 MED ORDER — FENTANYL CITRATE (PF) 100 MCG/2ML IJ SOLN
INTRAMUSCULAR | Status: AC
  Administered 2021-05-11: 25 ug via INTRAVENOUS
  Filled 2021-05-11: qty 2

## 2021-05-11 MED ORDER — LACTATED RINGERS IV SOLN: INTRAVENOUS | Status: DC

## 2021-05-11 MED ORDER — ORAL CARE MOUTH RINSE: 15.0000 mL | Freq: Once | OROMUCOSAL | Status: DC

## 2021-05-11 MED ORDER — LIDOCAINE HCL (CARDIAC) PF 100 MG/5ML IV SOSY
PREFILLED_SYRINGE | INTRAVENOUS | Status: DC | PRN
  Administered 2021-05-11: 50 mg via INTRAVENOUS

## 2021-05-11 MED ORDER — VASOPRESSIN 20 UNIT/ML IV SOLN
INTRAVENOUS | Status: DC | PRN
  Administered 2021-05-11: 23 mL via INTRAMUSCULAR

## 2021-05-11 MED ORDER — 0.9 % SODIUM CHLORIDE (POUR BTL) OPTIME
TOPICAL | Status: DC | PRN
  Administered 2021-05-11: 750 mL

## 2021-05-11 MED ORDER — FAMOTIDINE 20 MG PO TABS
20.0000 mg | ORAL_TABLET | Freq: Once | ORAL | Status: DC
Start: 2021-05-11 — End: 2021-05-13

## 2021-05-11 MED ORDER — ONDANSETRON HCL 4 MG/2ML IJ SOLN: 4.0000 mg | Freq: Once | INTRAMUSCULAR | Status: DC | PRN

## 2021-05-11 MED ORDER — SODIUM CHLORIDE (PF) 0.9 % IJ SOLN
INTRAMUSCULAR | Status: AC
  Filled 2021-05-11: qty 50

## 2021-05-11 MED ORDER — VASOPRESSIN 20 UNIT/ML IV SOLN
INTRAVENOUS | Status: AC
  Filled 2021-05-11: qty 1

## 2021-05-11 MED ORDER — CEFAZOLIN SODIUM-DEXTROSE 1-4 GM/50ML-% IV SOLN
INTRAVENOUS | Status: DC | PRN
  Administered 2021-05-11: 2 g via INTRAVENOUS

## 2021-05-11 MED ORDER — FENTANYL CITRATE (PF) 100 MCG/2ML IJ SOLN
25.0000 ug | INTRAMUSCULAR | Status: DC | PRN
  Administered 2021-05-11: 50 ug via INTRAVENOUS
  Administered 2021-05-11 (×2): 25 ug via INTRAVENOUS

## 2021-05-11 MED ORDER — ONDANSETRON HCL 4 MG/2ML IJ SOLN: 4.0000 mg | Freq: Four times a day (QID) | INTRAMUSCULAR | Status: DC | PRN

## 2021-05-11 MED ORDER — PROPOFOL 10 MG/ML IV BOLUS
INTRAVENOUS | Status: AC
  Filled 2021-05-11: qty 20

## 2021-05-11 MED ORDER — ONDANSETRON HCL 4 MG PO TABS: 4.0000 mg | ORAL_TABLET | Freq: Four times a day (QID) | ORAL | Status: DC | PRN

## 2021-05-11 MED ORDER — HYDROMORPHONE HCL 1 MG/ML IJ SOLN
0.5000 mg | INTRAMUSCULAR | Status: DC | PRN
  Administered 2021-05-11: 0.5 mg via INTRAVENOUS

## 2021-05-11 MED ORDER — DEXTROSE IN LACTATED RINGERS 5 % IV SOLN: INTRAVENOUS | Status: DC

## 2021-05-11 MED ORDER — PHENYLEPHRINE HCL (PRESSORS) 10 MG/ML IV SOLN
INTRAVENOUS | Status: AC
  Filled 2021-05-11: qty 1

## 2021-05-11 MED ORDER — ONDANSETRON HCL 4 MG/2ML IJ SOLN
INTRAMUSCULAR | Status: DC | PRN
  Administered 2021-05-11: 4 mg via INTRAVENOUS

## 2021-05-11 MED ORDER — DEXMEDETOMIDINE (PRECEDEX) IN NS 20 MCG/5ML (4 MCG/ML) IV SYRINGE
PREFILLED_SYRINGE | INTRAVENOUS | Status: DC | PRN
  Administered 2021-05-11: 8 ug via INTRAVENOUS
  Administered 2021-05-11: 4 ug via INTRAVENOUS
  Administered 2021-05-11: 8 ug via INTRAVENOUS

## 2021-05-11 MED ORDER — DEXAMETHASONE SODIUM PHOSPHATE 10 MG/ML IJ SOLN
INTRAMUSCULAR | Status: DC | PRN
  Administered 2021-05-11: 10 mg via INTRAVENOUS

## 2021-05-11 MED ORDER — CHLORHEXIDINE GLUCONATE 0.12 % MT SOLN
OROMUCOSAL | Status: AC
  Filled 2021-05-11: qty 15

## 2021-05-11 MED ORDER — SIMETHICONE 80 MG PO CHEW
80.0000 mg | CHEWABLE_TABLET | Freq: Four times a day (QID) | ORAL | Status: DC | PRN
  Administered 2021-05-12: 80 mg via ORAL
  Filled 2021-05-11: qty 1

## 2021-05-11 MED ORDER — HYDROMORPHONE HCL 1 MG/ML IJ SOLN
INTRAMUSCULAR | Status: AC
  Filled 2021-05-11: qty 1

## 2021-05-11 MED ORDER — POVIDONE-IODINE 10 % EX SWAB: 2.0000 "application " | Freq: Once | CUTANEOUS | Status: DC

## 2021-05-11 MED ORDER — ROCURONIUM BROMIDE 100 MG/10ML IV SOLN
INTRAVENOUS | Status: DC | PRN
  Administered 2021-05-11: 50 mg via INTRAVENOUS

## 2021-05-11 MED ORDER — PHENYLEPHRINE HCL (PRESSORS) 10 MG/ML IV SOLN
INTRAVENOUS | Status: DC | PRN
  Administered 2021-05-11 (×3): 100 ug via INTRAVENOUS

## 2021-05-11 MED ORDER — KETOROLAC TROMETHAMINE 15 MG/ML IJ SOLN
INTRAMUSCULAR | Status: AC
  Filled 2021-05-11: qty 1

## 2021-05-11 MED ORDER — STERILE WATER FOR IRRIGATION IR SOLN
Status: DC | PRN
  Administered 2021-05-11: 500 mL

## 2021-05-11 MED ORDER — FENTANYL CITRATE (PF) 100 MCG/2ML IJ SOLN
INTRAMUSCULAR | Status: AC
  Filled 2021-05-11: qty 2

## 2021-05-11 MED ORDER — ACETAMINOPHEN 10 MG/ML IV SOLN
INTRAVENOUS | Status: DC | PRN
  Administered 2021-05-11: 1000 mg via INTRAVENOUS

## 2021-05-11 MED ORDER — EPHEDRINE SULFATE 50 MG/ML IJ SOLN
INTRAMUSCULAR | Status: DC | PRN
  Administered 2021-05-11: 10 mg via INTRAVENOUS

## 2021-05-11 MED ORDER — MEPERIDINE HCL 25 MG/ML IJ SOLN: 6.2500 mg | INTRAMUSCULAR | Status: DC | PRN

## 2021-05-11 MED ORDER — LIDOCAINE-EPINEPHRINE 1 %-1:100000 IJ SOLN
INTRAMUSCULAR | Status: AC
  Filled 2021-05-11: qty 1

## 2021-05-11 MED ORDER — KETOROLAC TROMETHAMINE 30 MG/ML IJ SOLN: 30.0000 mg | Freq: Once | INTRAMUSCULAR | Status: DC

## 2021-05-11 MED ORDER — PROPOFOL 10 MG/ML IV BOLUS
INTRAVENOUS | Status: DC | PRN
  Administered 2021-05-11: 150 mg via INTRAVENOUS

## 2021-05-11 MED ORDER — FAMOTIDINE 20 MG PO TABS
ORAL_TABLET | ORAL | Status: AC
  Filled 2021-05-11: qty 1

## 2021-05-11 MED ORDER — ESTROGENS CONJUGATED 0.625 MG/GM VA CREA
TOPICAL_CREAM | VAGINAL | Status: AC
  Filled 2021-05-11: qty 30

## 2021-05-11 MED ORDER — FENTANYL CITRATE (PF) 100 MCG/2ML IJ SOLN
INTRAMUSCULAR | Status: DC | PRN
  Administered 2021-05-11 (×2): 25 ug via INTRAVENOUS
  Administered 2021-05-11: 50 ug via INTRAVENOUS

## 2021-05-11 MED ORDER — CHLORHEXIDINE GLUCONATE 0.12 % MT SOLN: 15.0000 mL | Freq: Once | OROMUCOSAL | Status: DC

## 2021-05-11 MED ORDER — OXYCODONE-ACETAMINOPHEN 5-325 MG PO TABS
1.0000 | ORAL_TABLET | ORAL | Status: DC | PRN
  Administered 2021-05-11 – 2021-05-12 (×3): 2 via ORAL
  Filled 2021-05-11 (×3): qty 2

## 2021-05-11 MED ORDER — KETOROLAC TROMETHAMINE 15 MG/ML IJ SOLN
15.0000 mg | INTRAMUSCULAR | Status: AC
  Administered 2021-05-11: 15 mg via INTRAVENOUS

## 2021-05-11 MED ORDER — KETOROLAC TROMETHAMINE 30 MG/ML IJ SOLN
INTRAMUSCULAR | Status: DC | PRN
  Administered 2021-05-11: 15 mg via INTRAVENOUS

## 2021-05-11 MED ORDER — IBUPROFEN 600 MG PO TABS
600.0000 mg | ORAL_TABLET | Freq: Four times a day (QID) | ORAL | Status: DC
  Administered 2021-05-11 – 2021-05-13 (×8): 600 mg via ORAL
  Filled 2021-05-11 (×8): qty 1

## 2021-05-11 MED ORDER — SUGAMMADEX SODIUM 200 MG/2ML IV SOLN
INTRAVENOUS | Status: DC | PRN
  Administered 2021-05-11: 200 mg via INTRAVENOUS

## 2021-05-11 SURGICAL SUPPLY — 75 items
ADHESIVE MASTISOL STRL (MISCELLANEOUS) IMPLANT
BAG DECANTER FOR FLEXI CONT (MISCELLANEOUS) ×2 IMPLANT
BAG URINE DRAIN 2000ML AR STRL (UROLOGICAL SUPPLIES) ×2 IMPLANT
BAG URINE DRAIN UROCATCH STRL (MISCELLANEOUS) ×2 IMPLANT
BASIN GRAD PLASTIC 32OZ STRL (MISCELLANEOUS) ×4 IMPLANT
BLADE SURG 15 STRL LF DISP TIS (BLADE) ×1 IMPLANT
BLADE SURG 15 STRL SS (BLADE) ×1
BLADE SURG 15 STRL SS SAFETY (BLADE) ×2 IMPLANT
BLADE SURG SZ10 CARB STEEL (BLADE) ×2 IMPLANT
BNDG GAUZE ELAST 4 BULKY (GAUZE/BANDAGES/DRESSINGS) ×2 IMPLANT
CATH FOLEY 2WAY  5CC 16FR (CATHETERS) ×1
CATH FOLEY 2WAY SIL 16X30 (CATHETERS) ×2 IMPLANT
CATH URTH 16FR FL 2W BLN LF (CATHETERS) ×1 IMPLANT
CLEANER CAUTERY TIP 5X5 PAD (MISCELLANEOUS) IMPLANT
COVER MAYO STAND REUSABLE (DRAPES) ×2 IMPLANT
DERMABOND ADVANCED (GAUZE/BANDAGES/DRESSINGS) ×1
DERMABOND ADVANCED .7 DNX12 (GAUZE/BANDAGES/DRESSINGS) ×1 IMPLANT
DRAPE 3/4 80X56 (DRAPES) ×2 IMPLANT
DRAPE LAPAROTOMY 100X77 ABD (DRAPES) ×2 IMPLANT
DRAPE PERI LITHO V/GYN (MISCELLANEOUS) ×2 IMPLANT
DRAPE UNDER BUTTOCK W/FLU (DRAPES) ×2 IMPLANT
DRAPE UTILITY 15X26 TOWEL STRL (DRAPES) ×2 IMPLANT
DRAPE XRAY CASSETTE 23X24 (DRAPES) IMPLANT
ELECT CAUTERY BLADE 6.4 (BLADE) ×2 IMPLANT
ELECT REM PT RETURN 9FT ADLT (ELECTROSURGICAL) ×2
ELECTRODE REM PT RTRN 9FT ADLT (ELECTROSURGICAL) ×1 IMPLANT
GAUZE 4X4 16PLY ~~LOC~~+RFID DBL (SPONGE) ×4 IMPLANT
GAUZE PACK 2X3YD (PACKING) IMPLANT
GLOVE SURG ENC MOIS LTX SZ6.5 (GLOVE) ×2 IMPLANT
GLOVE SURG ENC MOIS LTX SZ8 (GLOVE) ×2 IMPLANT
GLOVE SURG POLY ORTHO LF SZ7.5 (GLOVE) ×4 IMPLANT
GLOVE SURG UNDER LTX SZ7 (GLOVE) ×2 IMPLANT
GOWN STRL REUS W/ TWL LRG LVL3 (GOWN DISPOSABLE) ×2 IMPLANT
GOWN STRL REUS W/ TWL XL LVL3 (GOWN DISPOSABLE) ×1 IMPLANT
GOWN STRL REUS W/TWL LRG LVL3 (GOWN DISPOSABLE) ×2
GOWN STRL REUS W/TWL XL LVL3 (GOWN DISPOSABLE) ×1
HANDLE YANKAUER SUCT BULB TIP (MISCELLANEOUS) ×2 IMPLANT
IV LACTATED RINGERS 1000ML (IV SOLUTION) IMPLANT
KIT TURNOVER CYSTO (KITS) ×2 IMPLANT
LABEL OR SOLS (LABEL) ×2 IMPLANT
MANIFOLD NEPTUNE II (INSTRUMENTS) ×2 IMPLANT
NDL SAFETY ECLIPSE 18X1.5 (NEEDLE) ×1 IMPLANT
NEEDLE HYPO 18GX1.5 SHARP (NEEDLE) ×1
NEEDLE HYPO 22GX1.5 SAFETY (NEEDLE) ×2 IMPLANT
NEEDLE SPNL 22GX3.5 QUINCKE BK (NEEDLE) ×2 IMPLANT
NS IRRIG 1000ML POUR BTL (IV SOLUTION) ×2 IMPLANT
NS IRRIG 500ML POUR BTL (IV SOLUTION) IMPLANT
PACK BASIN MINOR ARMC (MISCELLANEOUS) ×2 IMPLANT
PAD CLEANER CAUTERY TIP 5X5 (MISCELLANEOUS)
PAD OB MATERNITY 4.3X12.25 (PERSONAL CARE ITEMS) ×2 IMPLANT
PAD PREP 24X41 OB/GYN DISP (PERSONAL CARE ITEMS) ×2 IMPLANT
RETRACTOR PHONTONGUIDE ADAPT (ADAPTER) IMPLANT
SCRUB EXIDINE 4% CHG 4OZ (MISCELLANEOUS) ×2 IMPLANT
SET CYSTO W/LG BORE CLAMP LF (SET/KITS/TRAYS/PACK) ×2 IMPLANT
SLING TRANSOBTURATOR OBTRYX (Sling) ×2 IMPLANT
SOL PREP PVP 2OZ (MISCELLANEOUS) ×2
SOLUTION PREP PVP 2OZ (MISCELLANEOUS) ×1 IMPLANT
STRAP SAFETY 5IN WIDE (MISCELLANEOUS) ×2 IMPLANT
STRIP CLOSURE SKIN 1/2X4 (GAUZE/BANDAGES/DRESSINGS) IMPLANT
SURGILUBE 2OZ TUBE FLIPTOP (MISCELLANEOUS) ×2 IMPLANT
SUT CHROMIC 0 CT 1 (SUTURE) ×4 IMPLANT
SUT CHROMIC 1-0 (SUTURE) ×2 IMPLANT
SUT CHROMIC 2 0 CT 1 (SUTURE) ×4 IMPLANT
SUT VIC AB 0 CT1 27 (SUTURE) ×3
SUT VIC AB 0 CT1 27XCR 8 STRN (SUTURE) ×3 IMPLANT
SUT VIC AB 0 CT1 36 (SUTURE) ×8 IMPLANT
SUT VIC AB 2-0 CT1 (SUTURE) IMPLANT
SUT VICRYL 0 AB UR-6 (SUTURE) ×4 IMPLANT
SUT VICRYL+ 3-0 36IN CT-1 (SUTURE) IMPLANT
SYR 10ML LL (SYRINGE) ×4 IMPLANT
SYR CONTROL 10ML LL (SYRINGE) ×2 IMPLANT
SYR TOOMEY IRRIG 70ML (MISCELLANEOUS) ×2
SYRINGE TOOMEY IRRIG 70ML (MISCELLANEOUS) ×1 IMPLANT
TOWEL OR 17X26 4PK STRL BLUE (TOWEL DISPOSABLE) ×2 IMPLANT
WATER STERILE IRR 500ML POUR (IV SOLUTION) ×4 IMPLANT

## 2021-05-11 NOTE — Transfer of Care (Signed)
Immediate Anesthesia Transfer of Care Note  Patient: Amanda Cruz  Procedure(s) Performed: HYSTERECTOMY VAGINAL ANTERIOR (CYSTOCELE) AND POSTERIOR REPAIR (RECTOCELE) PUBO-VAGINAL SLING (TOT)  Patient Location: PACU  Anesthesia Type:General  Level of Consciousness: drowsy  Airway & Oxygen Therapy: Patient Spontanous Breathing and Patient connected to face mask oxygen  Post-op Assessment: Report given to RN and Post -op Vital signs reviewed and stable  Post vital signs: Reviewed and stable  Last Vitals:  Vitals Value Taken Time  BP 155/73 05/11/21 1030  Temp 36 C 05/11/21 1030  Pulse 62 05/11/21 1034  Resp 16 05/11/21 1034  SpO2 100 % 05/11/21 1034  Vitals shown include unvalidated device data.  Last Pain:  Vitals:   05/11/21 0653  TempSrc: Temporal  PainSc: 0-No pain         Complications: No notable events documented.

## 2021-05-11 NOTE — Anesthesia Postprocedure Evaluation (Signed)
Anesthesia Post Note  Patient: Amanda Cruz  Procedure(s) Performed: HYSTERECTOMY VAGINAL ANTERIOR (CYSTOCELE) AND POSTERIOR REPAIR (RECTOCELE) PUBO-VAGINAL SLING (TOT)  Patient location during evaluation: PACU Anesthesia Type: General Level of consciousness: awake and alert, awake and oriented Pain management: pain level controlled Vital Signs Assessment: post-procedure vital signs reviewed and stable Respiratory status: spontaneous breathing, nonlabored ventilation and respiratory function stable Cardiovascular status: blood pressure returned to baseline and stable Postop Assessment: no apparent nausea or vomiting Anesthetic complications: no   No notable events documented.   Last Vitals:  Vitals:   05/11/21 1200 05/11/21 1223  BP: (!) 165/72 (!) 170/83  Pulse: (!) 59 67  Resp: 11 14  Temp: (!) 36.3 C 36.6 C  SpO2: 100% 99%    Last Pain:  Vitals:   05/11/21 1233  TempSrc:   PainSc: 5                  Phill Mutter

## 2021-05-11 NOTE — Anesthesia Procedure Notes (Signed)
Procedure Name: Intubation Date/Time: 05/11/2021 8:18 AM Performed by: Biagio Borg, CRNA Pre-anesthesia Checklist: Patient identified, Emergency Drugs available, Suction available and Patient being monitored Patient Re-evaluated:Patient Re-evaluated prior to induction Oxygen Delivery Method: Circle system utilized Preoxygenation: Pre-oxygenation with 100% oxygen Induction Type: IV induction Ventilation: Mask ventilation without difficulty Laryngoscope Size: McGraph and 3 Grade View: Grade I Tube type: Oral Tube size: 7.0 mm Number of attempts: 1 Airway Equipment and Method: Stylet Placement Confirmation: ETT inserted through vocal cords under direct vision, positive ETCO2 and breath sounds checked- equal and bilateral Secured at: 21 cm Tube secured with: Tape Dental Injury: Teeth and Oropharynx as per pre-operative assessment

## 2021-05-11 NOTE — Op Note (Signed)
OPERATIVE NOTE 05/11/2021 10:44 AM  PRE-OPERATIVE DIAGNOSIS:  1) Pelvic Relaxation, Complete Pelvic Prolapse, Urinary Retention  POST-OPERATIVE DIAGNOSIS:  1)Same  OPERATION: Procedure(s) (LRB): HYSTERECTOMY VAGINAL (N/A) ANTERIOR (CYSTOCELE) AND POSTERIOR REPAIR (RECTOCELE) (N/A) PUBO-VAGINAL SLING (TOT) (N/A)    SURGEON(S): Surgeon(s) and Role:    Harlin Heys, MD - Primary    * Rubie Maid, MD - Assisting - No other capable assistant was available for this surgery which requires an experienced, high level assistant.  She provided exposure, dissection, suctioning, retraction, and general support and assistance during the procedure.    ANESTHESIA: General  ESTIMATED BLOOD LOSS: 50 mL  SPECIMEN:  ID Type Source Tests Collected by Time Destination  1 : UTERUS, AND CERVIX GYN Uterus and Cervix SURGICAL PATHOLOGY Harlin Heys, MD [id number]     COMPLICATIONS: None  DRAINS: Foley to gravity  DISPOSITION: Stable to recovery room  DESCRIPTION OF PROCEDURE:      The patient was prepped and draped in the dorsolithotomy position and placed under general anesthesia. The bladder was emptied.  A weighted speculum was placed posteriorly in the vagina. A multi-toothed tenaculum was used to grasp the cervix and the cervix was injected in a circumferential manner with a dilute Pitressin solution. An incision was made around the cervix and the vaginal mucosa was dissected off of the cervix. The posterior cul-de-sac was identified and entered and the weighted speculum was placed within this. The anterior cul-de-sac was identified and entered and a retractor was placed and used to retract the bladder anteriorly keeping it out of the operative field. The uterosacral ligaments were clamped divided and suture ligated. The cardinal ligaments were clamped divided and suture ligated.  The uterine arteries were clamped divided and suture-ligated.  We then proceeded up the  lateral aspects of the uterus clamping dividing and suture ligating all pedicles.  The final pedicle included the upper aspect of the broad ligament the utero-ovarian ligament and the fallopian tube.  This was clamped divided suture-ligated and held.  This allowed delivery of the uterus and cervix.   Angle sutures were placed in the usual manner. A culdoplasty was performed. The peritoneum was identified anteriorly and then incorporating the left upper pedicle left lower pedicle right lower pedicle right upper pedicle and anterior peritoneum a pursestring suture was placed exteriorizing all pedicles. Hemostasis of all pedicles was noted at this time. The vaginal mucosa was then closed with a suture of Vicryl. The vaginal mucosa beginning at the vaginal cuff and overlying the bladder, was grasped with Allis clamps and injected with a dilute Pitressin solution in the midline. A midline incision was made to the level of the urethra. The vaginal mucosa was dissected laterally from the underlying attenuated fascia. A Foley catheter was placed within the bladder and the bladder was emptied. Clear urine was noted. The obturator foramina were identified in the usual manner bilaterally and marked with a marking pen the skin and subcutaneous tissues were injected with a dilute Pitressin solution. Stab incisions were made and the TOT trochars were placed through these incisions onto the operator's finger in the vagina which was retracted and bladder medially. The vaginal tape was then placed on the trochars and reversed through these incisions. A Kelly clamp was placed under the tape and the sleeves of the tape were removed. The tape was noted to be correctly positioned underneath the urethra without twists. The excess tape was removed at the level of the skin.  Steri-Strips were applied over these small skin incisions. A typical Kelly plication was performed carefully covering the tension-free vaginal tape with thickened  fascia. The bladder was plicated several sutures of 3-0 Vicryl.  A shelf of fascia was then approximated in the midline placing the bladder back in its more anatomic position.The excess vaginal mucosa was trimmed. Vaginal mucosa was then closed in the midline with interrupted sutures to the level of the vaginal cuff. The vaginal cuff was closed with Vicryl suture. Hemostasis was noted. The posterior fourchette at approximately the hymenal ring was grasped using Allis clamps. The posterior vaginal mucosa was injected in the midline with a dilute Pitressin solution. A midline incision was made through the vaginal mucosa to the level of the vaginal cuff, and the vaginal mucosa was dissected laterally exposing the underlying attenuated fascia. Peritoneum over lying fatty tissue was identified and the enterocoele sac was tied of and approximated in a purse-string manner. Beginning at the vaginal cuff the attenuated fascia was grasped laterally and approximated in the midline thickening and tightening the fascia. These sutures were carried down to the level of the perineum. The excess vaginal mucosa was trimmed. The vagina was closed with interrupted sutures beginning at the vaginal cuff and carried down toward the perineal body. The perineal body was reinforced with multiple sutures of Vicryl. The mucosa was then closed over the perineal body in a subcuticular manner. Hemostasis was noted.  Clear urine was noted in the Foley.   Finis Bud, M.D. 05/11/2021 10:44 AM

## 2021-05-11 NOTE — Anesthesia Preprocedure Evaluation (Addendum)
Anesthesia Evaluation  Patient identified by MRN, date of birth, ID band Patient awake    Reviewed: Allergy & Precautions, NPO status , Patient's Chart, lab work & pertinent test results, reviewed documented beta blocker date and time   History of Anesthesia Complications Negative for: history of anesthetic complications  Airway Mallampati: III       Dental  (+) Upper Dentures, Lower Dentures   Pulmonary shortness of breath and with exertion,    Pulmonary exam normal        Cardiovascular Exercise Tolerance: Good hypertension, Pt. on medications and Pt. on home beta blockers (-) angina(-) Past MI and (-) Cardiac Stents Normal cardiovascular exam(-) dysrhythmias (-) Valvular Problems/Murmurs     Neuro/Psych  Neuromuscular disease negative psych ROS   GI/Hepatic Neg liver ROS, GERD  Medicated,  Endo/Other  negative endocrine ROS  Renal/GU negative Renal ROS  negative genitourinary   Musculoskeletal negative musculoskeletal ROS (+)   Abdominal Normal abdominal exam  (+)   Peds negative pediatric ROS (+)  Hematology  (+) REFUSES BLOOD PRODUCTS (Agrees to Expanders), [facility]  Anesthesia Other Findings COVID-19 2021   GERD (gastroesophageal reflux disease) Hypertension    Wears dentures       Reproductive/Obstetrics negative OB ROS                           Anesthesia Physical  Anesthesia Plan  ASA: 2  Anesthesia Plan: General   Post-op Pain Management:    Induction: Intravenous  PONV Risk Score and Plan: Propofol infusion, Midazolam and Ondansetron  Airway Management Planned: Oral ETT  Additional Equipment:   Intra-op Plan:   Post-operative Plan: Extubation in OR  Informed Consent: I have reviewed the patients History and Physical, chart, labs and discussed the procedure including the risks, benefits and alternatives for the proposed anesthesia with the patient or  authorized representative who has indicated his/her understanding and acceptance.     Dental advisory given  Plan Discussed with: CRNA, Surgeon and Anesthesiologist  Anesthesia Plan Comments: [facility] Witness, refuses blood but consents to expanders as needed.)      Anesthesia Quick Evaluation

## 2021-05-11 NOTE — Interval H&P Note (Signed)
History and Physical Interval Note:  05/11/2021 7:36 AM  Amanda Cruz  has presented today for surgery, with the diagnosis of Pelvic Relaxation, Complete Pelvic Prolapse, Urinary Retention.  The various methods of treatment have been discussed with the patient and family. After consideration of risks, benefits and other options for treatment, the patient has consented to  Procedure(s): HYSTERECTOMY VAGINAL (N/A) ANTERIOR (CYSTOCELE) AND POSTERIOR REPAIR (RECTOCELE) (N/A) PUBO-VAGINAL SLING (TOT) (N/A) as a surgical intervention.  The patient's history has been reviewed, patient examined, no change in status, stable for surgery.  I have reviewed the patient's chart and labs.  Questions were answered to the patient's satisfaction.     Jeannie Fend

## 2021-05-11 NOTE — Progress Notes (Signed)
   05/11/21 0730  Clinical Encounter Type  Visited With Patient  Visit Type Initial;Spiritual support;Social support  Referral From Chaplain  Consult/Referral To Chaplain   ?Chaplain visited with PT from bedside. Chaplain established initial pastoral care. PT was patiently waiting to be taken to surgery. PT was in good spirits. There was no family presence in the room during this visit. Chaplain ministered with prayer, a calm presence, and active listening. Chaplain notified PT that chaplaincy services is available if she needs it.

## 2021-05-12 LAB — SURGICAL PATHOLOGY

## 2021-05-12 MED ORDER — METOPROLOL SUCCINATE ER 25 MG PO TB24
50.0000 mg | ORAL_TABLET | Freq: Every day | ORAL | Status: DC
  Administered 2021-05-12 – 2021-05-13 (×2): 50 mg via ORAL
  Filled 2021-05-12 (×2): qty 2

## 2021-05-12 MED ORDER — HYDROCHLOROTHIAZIDE 25 MG PO TABS
12.5000 mg | ORAL_TABLET | Freq: Every day | ORAL | Status: DC
  Administered 2021-05-12 – 2021-05-13 (×2): 12.5 mg via ORAL
  Filled 2021-05-12 (×2): qty 1

## 2021-05-12 MED ORDER — IRBESARTAN 150 MG PO TABS
300.0000 mg | ORAL_TABLET | Freq: Every day | ORAL | Status: DC
  Administered 2021-05-12 – 2021-05-13 (×2): 300 mg via ORAL
  Filled 2021-05-12 (×2): qty 2

## 2021-05-12 NOTE — Progress Notes (Signed)
Foley catheter clamped to start voiding trial, see orders.

## 2021-05-12 NOTE — Progress Notes (Signed)
Patient ID: Amanda Cruz, female   DOB: [birth year], 75 y.o.   MRN: [id number]      POST-OP NOTE - DAY # 1  Subjective:   The patient does not have complaints.  She is ambulating well. She is taking PO well. Her pain is well controlled with her current medications.    Objective:  BP (!) 147/64 (BP Location: Right Arm)   Pulse 73   Temp 98.7 F (37.1 C) (Oral)   Resp 20   Ht '5\' 2"'$  (1.575 m)   Wt 71.7 kg   SpO2 98%   BMI 28.90 kg/m     Abdomen:                          Abdomen soft and nontender without distention or masses          Assessment:   Doing well.  Normal progress as expected.     Plan:        1.  Out of bed-patient may shower   2.  To resume her normal antihypertensive medications   3.  Bladder training with Foley in place   4.  Measure void and residuals beginning tomorrow   5.  Surgery discussed with patient questions answered.   Finis Bud, M.D. 05/12/2021 8:35 AM

## 2021-05-13 MED ORDER — IBUPROFEN 600 MG PO TABS
600.0000 mg | ORAL_TABLET | Freq: Four times a day (QID) | ORAL | 0 refills | Status: DC
Start: 2021-05-13 — End: 2022-02-16

## 2021-05-13 NOTE — Discharge Summary (Signed)
Discharge Summary  Admit date: 05/11/2021  Discharge Date and Time:05/13/2021  9:16 AM  Discharge to:  Home  Admission Diagnosis: Present on Admission:  Complete uterovaginal prolapse                     Discharge  Diagnoses: Active Problems:   Complete uterovaginal prolapse   Post-operative state   OR Procedures:   Procedure(s): HYSTERECTOMY VAGINAL ANTERIOR (CYSTOCELE) AND POSTERIOR REPAIR (RECTOCELE) PUBO-VAGINAL SLING (TOT) Date -------------------                              Discharge Day Progress Note:   Subjective:   The patient does not have complaints.  She is ambulating well. She is taking PO well. Her pain is well controlled with her current medications. She is urinating without difficulty and is passing flatus.   Objective:  BP (!) 150/67 (BP Location: Left Arm)   Pulse 65   Temp 97.7 F (36.5 C) (Oral)   Resp 16   Ht '5\' 2"'$  (1.575 m)   Wt 71.7 kg   SpO2 96%   BMI 28.90 kg/m     Abdomen:                           Soft Nt    Patient voided 300 with 100 residual -second time 300 with 75 residual    Assessment:   Doing well.  Normal progress as expected.   Voiding without difficulty   Not having pain  Plan:        Discharge home.                       Medications as directed.  Hospital Course:  No notes on file   Condition at Discharge:  good Discharge Medications:  Allergies as of 05/13/2021   No Known Allergies      Medication List     STOP taking these medications    metroNIDAZOLE 500 MG tablet Commonly known as: FLAGYL       TAKE these medications    albuterol 108 (90 Base) MCG/ACT inhaler Commonly known as: VENTOLIN HFA Inhale 2 puffs into the lungs every 6 (six) hours as needed for wheezing or shortness of breath.   ASPIRIN 81 PO Take by mouth every other day.   atorvastatin 40 MG tablet Commonly known as: LIPITOR Take 1 tablet by mouth once daily   ELDERBERRY PO Take 50 mg by mouth daily.   HAIR/SKIN/NAILS  PO Take 1 tablet by mouth daily.   ibuprofen 600 MG tablet Commonly known as: ADVIL Take 1 tablet (600 mg total) by mouth every 6 (six) hours.   IRON-VITAMIN C PO Take 1 tablet by mouth daily.   metoprolol succinate 50 MG 24 hr tablet Commonly known as: TOPROL-XL Take 50 mg by mouth daily.   OVER THE COUNTER MEDICATION Take 1 capsule by mouth daily. Beets   Systane Balance 0.6 % Soln Generic drug: Propylene Glycol Place 1 drop into both eyes daily as needed (dry eyes).   telmisartan-hydrochlorothiazide 80-12.5 MG tablet Commonly known as: MICARDIS HCT Take 1 tablet by mouth once daily   Vitamin D3 25 MCG (1000 UT) Caps Take 1,000 Units by mouth daily.               Discharge Care Instructions  (From admission, onward)  Start     Ordered   05/13/21 0000  No dressing needed       Comments: Keep wound area clean and dry as directed   05/13/21 0915             Follow Up:    Follow-up Information     Harlin Heys, MD Follow up in 1 week(s).   Specialties: Obstetrics and Gynecology, Radiology Contact information: [address] [place] [address] [phone]                 Finis Bud, M.D. 05/13/2021 9:16 AM

## 2021-05-13 NOTE — Progress Notes (Signed)
RN called MD; pt only able to tolerate 2 hours of foley being clamped then had extreme pain; RN unclamped foley at 2000 (was clamped at 1800) and foley drained and pt's pain was improved; per MD, leave foley unclamped all night, clamp it at 0600 on 9-14, remove it at 0700 on 9-14 while still clamped then pt to void

## 2021-05-13 NOTE — Progress Notes (Signed)
Patient discharged home with family.  Discharge instructions, when to follow up, and prescriptions reviewed with patient.  Patient verbalized understanding. Patient will be escorted out by auxiliary.   

## 2021-05-20 ENCOUNTER — Encounter: Payer: Self-pay | Admitting: Obstetrics and Gynecology

## 2021-05-20 ENCOUNTER — Other Ambulatory Visit: Payer: Self-pay

## 2021-05-20 ENCOUNTER — Ambulatory Visit (INDEPENDENT_AMBULATORY_CARE_PROVIDER_SITE_OTHER): Payer: PPO | Admitting: Obstetrics and Gynecology

## 2021-05-20 VITALS — BP 143/63 | HR 79 | Ht 62.0 in | Wt 157.3 lb

## 2021-05-20 DIAGNOSIS — Z9889 Other specified postprocedural states: Secondary | ICD-10-CM

## 2021-05-20 NOTE — Progress Notes (Signed)
HPI:      [name] is a 75 y.o. No obstetric history on file. who LMP was No LMP recorded. Patient is postmenopausal.  Subjective:   She presents today just over 1 week from surgery.  She reports she is doing very well.  She seems very happy about her surgery.  She says she has occasional on and off spotting but no significant bleeding.  She reports no pain.  She is voiding and having bowel movements without issue.    Hx: The following portions of the patient's history were reviewed and updated as appropriate:             She  has a past medical history of COVID-19 (2021), GERD (gastroesophageal reflux disease), Hypertension, and Wears dentures. She does not have any pertinent problems on file. She  has a past surgical history that includes Tear duct probing (03/10/2015); Tubal ligation; Colonoscopy with propofol (N/A, 05/09/2018); Cataract extraction w/PHACO (Left, 08/22/2019); Cataract extraction w/PHACO (Right, 09/19/2019); Eye surgery; Esophagogastroduodenoscopy (egd) with propofol (N/A, 02/05/2020); Knee arthroscopy (Right); Foot surgery (Bilateral); Vaginal hysterectomy (N/A, 05/11/2021); Anterior and posterior repair (N/A, 05/11/2021); and Pubovaginal sling (N/A, 05/11/2021). Her family history includes Heart failure in her mother; Stroke in her mother; Thyroid disease in her paternal grandfather. She  reports that she has never smoked. She has never used smokeless tobacco. She reports that she does not currently use alcohol after a past usage of about 1.0 standard drink per week. She reports that she does not use drugs. She has a current medication list which includes the following prescription(s): albuterol, atorvastatin, biotin w/ vitamins c & e, vitamin d3, elderberry, ibuprofen, iron-vitamin c, metoprolol succinate, OVER THE COUNTER MEDICATION, systane balance, telmisartan-hydrochlorothiazide, and aspirin. She has No Known Allergies.       Review of Systems:  Review of  Systems  Constitutional: Denied constitutional symptoms, night sweats, recent illness, fatigue, fever, insomnia and weight loss.  Eyes: Denied eye symptoms, eye pain, photophobia, vision change and visual disturbance.  Ears/Nose/Throat/Neck: Denied ear, nose, throat or neck symptoms, hearing loss, nasal discharge, sinus congestion and sore throat.  Cardiovascular: Denied cardiovascular symptoms, arrhythmia, chest pain/pressure, edema, exercise intolerance, orthopnea and palpitations.  Respiratory: Denied pulmonary symptoms, asthma, pleuritic pain, productive sputum, cough, dyspnea and wheezing.  Gastrointestinal: Denied, gastro-esophageal reflux, melena, nausea and vomiting.  Genitourinary: Denied genitourinary symptoms including symptomatic vaginal discharge, pelvic relaxation issues, and urinary complaints.  Musculoskeletal: Denied musculoskeletal symptoms, stiffness, swelling, muscle weakness and myalgia.  Dermatologic: Denied dermatology symptoms, rash and scar.  Neurologic: Denied neurology symptoms, dizziness, headache, neck pain and syncope.  Psychiatric: Denied psychiatric symptoms, anxiety and depression.  Endocrine: Denied endocrine symptoms including hot flashes and night sweats.   Meds:   Current Outpatient Medications on File Prior to Visit  Medication Sig Dispense Refill   albuterol (VENTOLIN HFA) 108 (90 Base) MCG/ACT inhaler Inhale 2 puffs into the lungs every 6 (six) hours as needed for wheezing or shortness of breath. 8 g 0   atorvastatin (LIPITOR) 40 MG tablet Take 1 tablet by mouth once daily 90 tablet 0   Biotin w/ Vitamins C & E (HAIR/SKIN/NAILS PO) Take 1 tablet by mouth daily.     Cholecalciferol (VITAMIN D3) 25 MCG (1000 UT) CAPS Take 1,000 Units by mouth daily.     ELDERBERRY PO Take 50 mg by mouth daily.     ibuprofen (ADVIL) 600 MG tablet Take 1 tablet (600 mg total) by mouth every 6 (six) hours. 30 tablet 0  IRON-VITAMIN C PO Take 1 tablet by mouth daily.      metoprolol succinate (TOPROL-XL) 50 MG 24 hr tablet Take 50 mg by mouth daily.     OVER THE COUNTER MEDICATION Take 1 capsule by mouth daily. Beets     Propylene Glycol (SYSTANE BALANCE) 0.6 % SOLN Place 1 drop into both eyes daily as needed (dry eyes).     telmisartan-hydrochlorothiazide (MICARDIS HCT) 80-12.5 MG tablet Take 1 tablet by mouth once daily 30 tablet 0   ASPIRIN 81 PO Take by mouth every other day. (Patient not taking: Reported on 05/20/2021)     No current facility-administered medications on file prior to visit.      Objective:     Vitals:   05/20/21 0801  BP: (!) 143/63  Pulse: 79   Filed Weights   05/20/21 0801  Weight: 157 lb 4.8 oz (71.4 kg)                        Assessment:    No obstetric history on file. Patient Active Problem List   Diagnosis Date Noted   Complete uterovaginal prolapse 05/11/2021   Post-operative state 05/11/2021   Cough 12/05/2020   Shortness of breath 11/06/2020   Dizziness 07/01/2020   Sciatica of left side 06/13/2020   Claudication ([place]) 06/13/2020   COVID-19 virus infection 05/29/2020   Dermatitis 04/17/2020   Acute gastritis without hemorrhage    Varicose veins of bilateral lower extremities with other complications 67/54/4920   Chronic venous insufficiency 08/07/2018   Hyperlipidemia 08/07/2018   Annual physical exam    Polyp of sigmoid colon    Pessary maintenance 04/24/2015   Cystocele with uterine prolapse 04/24/2015   Essential hypertension 03/13/2015   Acid reflux 03/13/2015     1. Post-operative state     Patient with excellent recovery so far.   Plan:            1.  Advised to not overexert herself, but may do most normal activities without heavy lifting and without putting anything in the vagina.  All questions answered. Orders No orders of the defined types were placed in this encounter.   No orders of the defined types were placed in this encounter.     F/U  Return in about 5 weeks (around  06/24/2021).  Amanda Cruz, M.D. 05/20/2021 8:05 AM

## 2021-05-20 NOTE — Progress Notes (Signed)
Pt present post op visit. Pt stated that she is doing well.

## 2021-05-21 ENCOUNTER — Encounter: Payer: PPO | Admitting: Obstetrics and Gynecology

## 2021-05-25 ENCOUNTER — Other Ambulatory Visit: Payer: Self-pay | Admitting: Internal Medicine

## 2021-05-28 ENCOUNTER — Ambulatory Visit (INDEPENDENT_AMBULATORY_CARE_PROVIDER_SITE_OTHER): Payer: PPO | Admitting: *Deleted

## 2021-05-28 DIAGNOSIS — Z0000 Encounter for general adult medical examination without abnormal findings: Secondary | ICD-10-CM

## 2021-05-28 NOTE — Progress Notes (Signed)
Subjective:   Amanda Cruz is a 75 y.o. female who presents for an Initial Medicare Annual Wellness Visit.  I discussed the limitations of evaluation and management by telemedicine and the availability of in person apts. The patient expressed understanding and agreed to proceed.   Visit performed using audio  Patient:home Provider:home   Review of Systems    Defer to provider  Cardiac Risk Factors include: advanced age (>65men, >42 women);hypertension     Objective:    Today's Vitals   05/28/21 0956  PainSc: 0-No pain   There is no height or weight on file to calculate BMI.  Advanced Directives 05/28/2021 05/11/2021 05/08/2021 02/05/2020 09/19/2019 08/22/2019 05/09/2018  Does Patient Have a Medical Advance Directive? No No No No No No No  Would patient like information on creating a medical advance directive? No - Patient declined No - Patient declined - - Yes (MAU/Ambulatory/Procedural Areas - Information given) No - Patient declined -    Current Medications (verified) Outpatient Encounter Medications as of 05/28/2021  Medication Sig   albuterol (VENTOLIN HFA) 108 (90 Base) MCG/ACT inhaler Inhale 2 puffs into the lungs every 6 (six) hours as needed for wheezing or shortness of breath.   atorvastatin (LIPITOR) 40 MG tablet Take 1 tablet by mouth once daily   Biotin w/ Vitamins C & E (HAIR/SKIN/NAILS PO) Take 1 tablet by mouth daily.   Cholecalciferol (VITAMIN D3) 25 MCG (1000 UT) CAPS Take 1,000 Units by mouth daily.   ELDERBERRY PO Take 50 mg by mouth daily.   ibuprofen (ADVIL) 600 MG tablet Take 1 tablet (600 mg total) by mouth every 6 (six) hours.   IRON-VITAMIN C PO Take 1 tablet by mouth daily.   metoprolol succinate (TOPROL-XL) 50 MG 24 hr tablet Take 50 mg by mouth daily.   OVER THE COUNTER MEDICATION Take 1 capsule by mouth daily. Beets   Propylene Glycol (SYSTANE BALANCE) 0.6 % SOLN Place 1 drop into both eyes daily as needed (dry eyes).    telmisartan-hydrochlorothiazide (MICARDIS HCT) 80-12.5 MG tablet Take 1 tablet by mouth once daily   ASPIRIN 81 PO Take by mouth every other day. (Patient not taking: Reported on 05/20/2021)   No facility-administered encounter medications on file as of 05/28/2021.    Allergies (verified) Patient has no known allergies.   History: Past Medical History:  Diagnosis Date   COVID-19 2021   GERD (gastroesophageal reflux disease)    Hypertension    Wears dentures    full upper and lower   Past Surgical History:  Procedure Laterality Date   ANTERIOR AND POSTERIOR REPAIR N/A 05/11/2021   Procedure: ANTERIOR (CYSTOCELE) AND POSTERIOR REPAIR (RECTOCELE);  Surgeon: Harlin Heys, MD;  Location: [facility];  Service: Gynecology;  Laterality: N/A;   CATARACT EXTRACTION W/PHACO Left 08/22/2019   Procedure: CATARACT EXTRACTION PHACO AND INTRAOCULAR LENS PLACEMENT (IOC) LEFT  9.98  01:10.4  14.2%;  Surgeon: Leandrew Koyanagi, MD;  Location: [place];  Service: Ophthalmology;  Laterality: Left;   CATARACT EXTRACTION W/PHACO Right 09/19/2019   Procedure: CATARACT EXTRACTION PHACO AND INTRAOCULAR LENS PLACEMENT (IOC) RIGHT PANOPTIX LENS 7.44  00:52.4  14.3%;  Surgeon: Leandrew Koyanagi, MD;  Location: [place];  Service: Ophthalmology;  Laterality: Right;   COLONOSCOPY WITH PROPOFOL N/A 05/09/2018   Procedure: COLONOSCOPY WITH PROPOFOL;  Surgeon: [name], MD;  Location: [facility]COPY;  Service: Endoscopy;  Laterality: N/A;   ESOPHAGOGASTRODUODENOSCOPY (EGD) WITH PROPOFOL N/A 02/05/2020   Procedure: ESOPHAGOGASTRODUODENOSCOPY (EGD) WITH PROPOFOL;  Surgeon: [name], MD;  Location: [facility] ENDOSCOPY;  Service: Endoscopy;  Laterality: N/A;   EYE SURGERY     FOOT SURGERY Bilateral    KNEE ARTHROSCOPY Right    PUBOVAGINAL SLING N/A 05/11/2021   Procedure: PUBO-VAGINAL SLING (TOT);  Surgeon: Harlin Heys, MD;  Location: [facility];  Service: Gynecology;  Laterality:  N/A;   TEAR DUCT PROBING  03/10/2015   TUBAL LIGATION     VAGINAL HYSTERECTOMY N/A 05/11/2021   Procedure: HYSTERECTOMY VAGINAL;  Surgeon: Harlin Heys, MD;  Location: [facility];  Service: Gynecology;  Laterality: N/A;   Family History  Problem Relation Age of Onset   Stroke Mother    Heart failure Mother    Thyroid disease Paternal Grandfather    Social History   Socioeconomic History   Marital status: Married    Spouse name: Not on file   Number of children: Not on file   Years of education: Not on file   Highest education level: Not on file  Occupational History   Not on file  Tobacco Use   Smoking status: Never   Smokeless tobacco: Never  Vaping Use   Vaping Use: Never used  Substance and Sexual Activity   Alcohol use: Not Currently    Alcohol/week: 1.0 standard drink    Types: 1 Glasses of wine per week   Drug use: No   Sexual activity: Not Currently    Birth control/protection: Post-menopausal  Other Topics Concern   Not on file  Social History Narrative   Not on file   Social Determinants of Health   Financial Resource Strain: Medium Risk   Difficulty of Paying Living Expenses: Somewhat hard  Food Insecurity: No Food Insecurity   Worried About Charity fundraiser in the Last Year: Never true   Ran Out of Food in the Last Year: Never true  Transportation Needs: No Transportation Needs   Lack of Transportation (Medical): No   Lack of Transportation (Non-Medical): No  Physical Activity: Insufficiently Active   Days of Exercise per Week: 2 days   Minutes of Exercise per Session: 10 min  Stress: No Stress Concern Present   Feeling of Stress : Only a little  Social Connections: Engineer, building services of Communication with Friends and Family: More than three times a week   Frequency of Social Gatherings with Friends and Family: More than three times a week   Attends Religious Services: More than 4 times per year   Active Member of [employer] or  Organizations: Yes   Attends Music therapist: More than 4 times per year   Marital Status: Married    Tobacco Counseling Counseling given: Not Answered   Clinical Intake:  Pre-visit preparation completed: Yes  Pain : No/denies pain Pain Score: 0-No pain     Nutritional Risks: None Diabetes: No  How often do you need to have someone help you when you read instructions, pamphlets, or other written materials from your doctor or pharmacy?: 1 - Never What is the last grade level you completed in school?: 12  Diabetic?NO  Interpreter Needed?: No  Information entered by :: Lacretia Nicks, [place]   Activities of Daily Living In your present state of health, do you have any difficulty performing the following activities: 05/28/2021 05/11/2021  Hearing? N Y  [place]? N N  Difficulty concentrating or making decisions? N N  Walking or climbing stairs? N Y  Comment - -  Dressing or  bathing? N N  Doing errands, shopping? N N  Preparing Food and eating ? N -  Using the Toilet? N -  In the past six months, have you accidently leaked urine? N -  Do you have problems with loss of bowel control? N -  Managing your Medications? N -  Managing your Finances? N -  Housekeeping or managing your Housekeeping? N -  Some recent data might be hidden    Patient Care Team: [name], MD as PCP - General (Internal Medicine)  Indicate any recent Medical Services you may have received from other than [facility] providers in the past year (date may be approximate).     Assessment:   This is a routine wellness examination for Amanda Cruz.  Hearing/Vision screen No results found.  Dietary issues and exercise activities discussed: Current Exercise Habits: The patient does not participate in regular exercise at present, Exercise limited by: None identified   Goals Addressed   None    Depression Screen PHQ 2/9 Scores 05/28/2021 04/21/2021 08/08/2020 04/17/2020  PHQ - 2  Score 0 0 0 0  PHQ- 9 Score - - - 0    Fall Risk Fall Risk  05/28/2021 08/08/2020 04/17/2020 04/17/2020 03/30/2019  Falls in the past year? 1 0 - 0 0  Comment - - - - Emmi Telephone Survey: data to providers prior to load  Number falls in past yr: 0 0 - 0 -  Injury with Fall? 1 0 - - -  Risk for fall due to : History of fall(s) - No Fall Risks No Fall Risks -  Follow up Falls evaluation completed - Falls evaluation completed - -    FALL RISK PREVENTION PERTAINING TO THE HOME:  Any stairs in or around the home? Yes  If so, are there any without handrails? No  Home free of loose throw rugs in walkways, pet beds, electrical cords, etc? Yes  Adequate lighting in your home to reduce risk of falls? Yes   ASSISTIVE DEVICES UTILIZED TO PREVENT FALLS:  Life alert? No  Use of a cane, walker or w/c? No  Grab bars in the bathroom? No  Shower chair or bench in shower? No  Elevated toilet seat or a handicapped toilet? No   TIMED UP AND GO:  Was the test performed? No .  Length of time to ambulate:NA   Gait slow and steady without use of assistive device  Cognitive Function: MMSE - Mini Mental State Exam 05/28/2021  Orientation to time 5  Orientation to Place 5  Registration 3  Attention/ Calculation 5  Recall 3  Language- name 2 objects 2  Language- repeat 1  Language- follow 3 step command 3  Language- read & follow direction 1  Write a sentence 0  Copy design 0  Total score 28     6CIT Screen 05/28/2021  What Year? 0 points  What month? 0 points  What time? 0 points  Count back from 20 0 points  Months in reverse 0 points  Repeat phrase 0 points  Total Score 0    Immunizations Immunization History  Administered Date(s) Administered   Fluad Quad(high Dose 65+) 07/01/2020   Influenza, High Dose Seasonal PF 08/02/2018   Influenza-Unspecified 06/25/2019   PFIZER(Purple Top)SARS-COV-2 Vaccination 10/24/2019, 11/14/2019    TDAP status: Due, Education has been provided  regarding the importance of this vaccine. Advised may receive this vaccine at local pharmacy or Health Dept. Aware to provide a copy of the vaccination record if  obtained from local pharmacy or Health Dept. Verbalized acceptance and understanding.  Flu Vaccine status: Due, Education has been provided regarding the importance of this vaccine. Advised may receive this vaccine at local pharmacy or Health Dept. Aware to provide a copy of the vaccination record if obtained from local pharmacy or Health Dept. Verbalized acceptance and understanding.  Pneumococcal vaccine status: Up to date  Covid-19 vaccine status: Completed vaccines  Qualifies for Shingles Vaccine? Yes   Zostavax completed Yes   Shingrix Completed?: Yes  Screening Tests Health Maintenance  Topic Date Due   Hepatitis C Screening  Never done   TETANUS/TDAP  Never done   Zoster Vaccines- Shingrix (1 of 2) Never done   COVID-19 Vaccine (3 - Booster for Pfizer series) 04/15/2020   INFLUENZA VACCINE  03/30/2021   COLONOSCOPY (Pts 45-39yrs Insurance coverage will need to be confirmed)  05/10/2023   DEXA SCAN  Completed   HPV VACCINES  Aged Out    Health Maintenance  Health Maintenance Due  Topic Date Due   Hepatitis C Screening  Never done   TETANUS/TDAP  Never done   Zoster Vaccines- Shingrix (1 of 2) Never done   COVID-19 Vaccine (3 - Booster for Pfizer series) 04/15/2020   INFLUENZA VACCINE  03/30/2021    Colorectal cancer screening: Type of screening: Colonoscopy. Completed 2019. Repeat every 5 years  Mammogram status: Completed 02/18/2021. Repeat every year  Bone Density status: Ordered  . Pt provided with contact info and advised to call to schedule appt.  Lung Cancer Screening: (Low Dose CT Chest recommended if Age 69-80 years, 30 pack-year currently smoking OR have quit w/in 15years.) does not qualify.   Lung Cancer Screening Referral: NA  Additional Screening:  Hepatitis C Screening: does qualify;  Completed No  Vision Screening: Recommended annual ophthalmology exams for early detection of glaucoma and other disorders of the eye. Is the patient up to date with their annual eye exam?  Yes  Who is the provider or what is the name of the office in which the patient attends annual eye exams? [facility]  If pt is not established with a provider, would they like to be referred to a provider to establish care?  Already establshed .   Dental Screening: Recommended annual dental exams for proper oral hygiene  Community Resource Referral / Chronic Care Management: CRR required this visit?  No   CCM required this visit?  No      Plan:     I have personally reviewed and noted the following in the patient's chart:   Medical and social history Use of alcohol, tobacco or illicit drugs  Current medications and supplements including opioid prescriptions. Patient is not currently taking opioid prescriptions. Functional ability and status Nutritional status Physical activity Advanced directives List of other physicians Hospitalizations, surgeries, and ER visits in previous 12 months Vitals Screenings to include cognitive, depression, and falls Referrals and appointments  In addition, I have reviewed and discussed with patient certain preventive protocols, quality metrics, and best practice recommendations. A written personalized care plan for preventive services as well as general preventive health recommendations were provided to patient.     Lacretia Nicks, [place]   05/28/2021   Nurse Notes:  Ms. Jauregui , Thank you for taking time to come for your Medicare Wellness Visit. I appreciate your ongoing commitment to your health goals. Please review the following plan we discussed and let me know if I can assist you in the future.  These are the goals we discussed:  Goals   None     This is a list of the screening recommended for you and due dates:  Health Maintenance  Topic Date  Due   Hepatitis C Screening: USPSTF Recommendation to screen - Ages 37-79 yo.  Never done   Tetanus Vaccine  Never done   Zoster (Shingles) Vaccine (1 of 2) Never done   COVID-19 Vaccine (3 - Booster for Pfizer series) 04/15/2020   Flu Shot  03/30/2021   Colon Cancer Screening  05/10/2023   DEXA scan (bone density measurement)  Completed   HPV Vaccine  Aged Out      Time spent with patient 45 min

## 2021-05-28 NOTE — Progress Notes (Signed)
I have reviewed this visit and agree with the documentation.   

## 2021-06-24 ENCOUNTER — Ambulatory Visit (INDEPENDENT_AMBULATORY_CARE_PROVIDER_SITE_OTHER): Payer: PPO

## 2021-06-24 ENCOUNTER — Encounter: Payer: Self-pay | Admitting: Obstetrics and Gynecology

## 2021-06-24 ENCOUNTER — Other Ambulatory Visit: Payer: Self-pay

## 2021-06-24 ENCOUNTER — Ambulatory Visit: Payer: PPO | Admitting: Obstetrics and Gynecology

## 2021-06-24 VITALS — BP 181/77 | HR 85 | Ht 62.0 in | Wt 159.3 lb

## 2021-06-24 DIAGNOSIS — Z23 Encounter for immunization: Secondary | ICD-10-CM | POA: Diagnosis not present

## 2021-06-24 DIAGNOSIS — Z9889 Other specified postprocedural states: Secondary | ICD-10-CM

## 2021-06-24 NOTE — Progress Notes (Signed)
Pt present for post op care after a hysterectomy and vaginal sling surgery. Pt stated that she was doing well after surgery; no pain. Pt denies any bladder or bowel issues. Pt had elevated bp in office. Pt stated that she has not took her bp medication and takes it after lunch and did not eat lunch and haven't had medication.

## 2021-06-24 NOTE — Progress Notes (Signed)
HPI:      Amanda Cruz is a 75 y.o. No obstetric history on file. who LMP was No LMP recorded. Patient is postmenopausal.  Subjective:   She presents today approximately 6 weeks from her hysterectomy and repair surgery including urethral sling.  Patient reports she has no problems with leaking bowel movements or pain.  She is very happy about having had the surgery.  She would like to begin performing some of her normal daily activities.    Hx: The following portions of the patient's history were reviewed and updated as appropriate:             She  has a past medical history of COVID-19 (2021), GERD (gastroesophageal reflux disease), Hypertension, and Wears dentures. She does not have any pertinent problems on file. She  has a past surgical history that includes Tear duct probing (03/10/2015); Tubal ligation; Colonoscopy with propofol (N/A, 05/09/2018); Cataract extraction w/PHACO (Left, 08/22/2019); Cataract extraction w/PHACO (Right, 09/19/2019); Eye surgery; Esophagogastroduodenoscopy (egd) with propofol (N/A, 02/05/2020); Knee arthroscopy (Right); Foot surgery (Bilateral); Vaginal hysterectomy (N/A, 05/11/2021); Anterior and posterior repair (N/A, 05/11/2021); and Pubovaginal sling (N/A, 05/11/2021). Her family history includes Heart failure in her mother; Stroke in her mother; Thyroid disease in her paternal grandfather. She  reports that she has never smoked. She has never used smokeless tobacco. She reports that she does not currently use alcohol after a past usage of about 1.0 standard drink per week. She reports that she does not use drugs. She has a current medication list which includes the following prescription(s): albuterol, atorvastatin, biotin w/ vitamins c & e, vitamin d3, elderberry, ibuprofen, iron-vitamin c, metoprolol succinate, OVER THE COUNTER MEDICATION, systane balance, telmisartan-hydrochlorothiazide, and aspirin. She has No Known Allergies.       Review of Systems:   Review of Systems  Constitutional: Denied constitutional symptoms, night sweats, recent illness, fatigue, fever, insomnia and weight loss.  Eyes: Denied eye symptoms, eye pain, photophobia, vision change and visual disturbance.  Ears/Nose/Throat/Neck: Denied ear, nose, throat or neck symptoms, hearing loss, nasal discharge, sinus congestion and sore throat.  Cardiovascular: Denied cardiovascular symptoms, arrhythmia, chest pain/pressure, edema, exercise intolerance, orthopnea and palpitations.  Respiratory: Denied pulmonary symptoms, asthma, pleuritic pain, productive sputum, cough, dyspnea and wheezing.  Gastrointestinal: Denied, gastro-esophageal reflux, melena, nausea and vomiting.  Genitourinary: Denied genitourinary symptoms including symptomatic vaginal discharge, pelvic relaxation issues, and urinary complaints.  Musculoskeletal: Denied musculoskeletal symptoms, stiffness, swelling, muscle weakness and myalgia.  Dermatologic: Denied dermatology symptoms, rash and scar.  Neurologic: Denied neurology symptoms, dizziness, headache, neck pain and syncope.  Psychiatric: Denied psychiatric symptoms, anxiety and depression.  Endocrine: Denied endocrine symptoms including hot flashes and night sweats.   Meds:   Current Outpatient Medications on File Prior to Visit  Medication Sig Dispense Refill   albuterol (VENTOLIN HFA) 108 (90 Base) MCG/ACT inhaler Inhale 2 puffs into the lungs every 6 (six) hours as needed for wheezing or shortness of breath. 8 g 0   atorvastatin (LIPITOR) 40 MG tablet Take 1 tablet by mouth once daily 90 tablet 0   Biotin w/ Vitamins C & E (HAIR/SKIN/NAILS PO) Take 1 tablet by mouth daily.     Cholecalciferol (VITAMIN D3) 25 MCG (1000 UT) CAPS Take 1,000 Units by mouth daily.     ELDERBERRY PO Take 50 mg by mouth daily.     ibuprofen (ADVIL) 600 MG tablet Take 1 tablet (600 mg total) by mouth every 6 (six) hours. 30 tablet 0   IRON-VITAMIN  C PO Take 1 tablet by mouth  daily.     metoprolol succinate (TOPROL-XL) 50 MG 24 hr tablet Take 50 mg by mouth daily.     OVER THE COUNTER MEDICATION Take 1 capsule by mouth daily. Beets     Propylene Glycol (SYSTANE BALANCE) 0.6 % SOLN Place 1 drop into both eyes daily as needed (dry eyes).     telmisartan-hydrochlorothiazide (MICARDIS HCT) 80-12.5 MG tablet Take 1 tablet by mouth once daily 30 tablet 0   ASPIRIN 81 PO Take by mouth every other day. (Patient not taking: Reported on 05/20/2021)     No current facility-administered medications on file prior to visit.      Objective:     Vitals:   06/24/21 1418  BP: (!) 181/77  Pulse: 85   Filed Weights   06/24/21 1418  Weight: 159 lb 4.8 oz (72.3 kg)             Abdomen: Soft.  Non-tender.  No masses.  No HSM.  Incision/s: Intact.  Healing well.  No erythema.  No drainage.    Pelvic:   Vulva: Normal appearance.  No lesions.  Vagina: No lesions or abnormalities noted.  Support: Normal pelvic support.  Urethra No masses tenderness or scarring.  Meatus Normal size without lesions or prolapse  Vag Cuff: Intact.  No lesions.  Anus: Normal exam.  No lesions.  Perineum: Normal exam.  No lesions.        Bimanual   Adnexae: No masses.  Non-tender to palpation.  Cuff: Negative for abnormality.             Assessment:    No obstetric history on file. Patient Active Problem List   Diagnosis Date Noted   Complete uterovaginal prolapse 05/11/2021   Post-operative state 05/11/2021   Cough 12/05/2020   Shortness of breath 11/06/2020   Dizziness 07/01/2020   Sciatica of left side 06/13/2020   Claudication ([place]) 06/13/2020   COVID-19 virus infection 05/29/2020   Dermatitis 04/17/2020   Acute gastritis without hemorrhage    Varicose veins of bilateral lower extremities with other complications 89/21/1941   Chronic venous insufficiency 08/07/2018   Hyperlipidemia 08/07/2018   Annual physical exam    Polyp of sigmoid colon    Pessary maintenance  04/24/2015   Cystocele with uterine prolapse 04/24/2015   Essential hypertension 03/13/2015   Acid reflux 03/13/2015     1. Post-operative state     Patient doing very well postop-no issues.   Plan:            1.  May resume normal activities with exception of heavy lifting. Orders No orders of the defined types were placed in this encounter.   No orders of the defined types were placed in this encounter.     F/U  Return in about 3 months (around 09/24/2021).  Finis Bud, M.D. 06/24/2021 2:41 PM

## 2021-07-06 ENCOUNTER — Other Ambulatory Visit: Payer: Self-pay | Admitting: Internal Medicine

## 2021-07-29 ENCOUNTER — Other Ambulatory Visit: Payer: Self-pay | Admitting: Internal Medicine

## 2021-07-30 ENCOUNTER — Other Ambulatory Visit: Payer: Self-pay | Admitting: Internal Medicine

## 2021-08-03 ENCOUNTER — Encounter: Payer: Self-pay | Admitting: Internal Medicine

## 2021-08-03 ENCOUNTER — Other Ambulatory Visit: Payer: Self-pay

## 2021-08-03 ENCOUNTER — Ambulatory Visit (INDEPENDENT_AMBULATORY_CARE_PROVIDER_SITE_OTHER): Payer: PPO | Admitting: Internal Medicine

## 2021-08-03 VITALS — BP 165/67 | HR 88 | Ht 62.0 in | Wt 157.2 lb

## 2021-08-03 DIAGNOSIS — I83893 Varicose veins of bilateral lower extremities with other complications: Secondary | ICD-10-CM | POA: Diagnosis not present

## 2021-08-03 DIAGNOSIS — R052 Subacute cough: Secondary | ICD-10-CM

## 2021-08-03 DIAGNOSIS — I10 Essential (primary) hypertension: Secondary | ICD-10-CM | POA: Diagnosis not present

## 2021-08-03 DIAGNOSIS — Z20822 Contact with and (suspected) exposure to covid-19: Secondary | ICD-10-CM | POA: Diagnosis not present

## 2021-08-03 LAB — POC COVID19 BINAXNOW: SARS Coronavirus 2 Ag: NEGATIVE

## 2021-08-03 MED ORDER — AZITHROMYCIN 250 MG PO TABS: ORAL_TABLET | ORAL | 0 refills | Status: AC

## 2021-08-03 NOTE — Progress Notes (Signed)
Established Patient Office Visit  Subjective:  Patient ID: Amanda Cruz, female    DOB: June 30, 1946  Age: 75 y.o. MRN: [id number]  CC:  Chief Complaint  Patient presents with   Cough    Patient has sore throat, cough, congestion. Sxs started x 1 week ago.     Cough   [name] presents for sore throat  Past Medical History:  Diagnosis Date   COVID-19 2021   GERD (gastroesophageal reflux disease)    Hypertension    Wears dentures    full upper and lower    Past Surgical History:  Procedure Laterality Date   ANTERIOR AND POSTERIOR REPAIR N/A 05/11/2021   Procedure: ANTERIOR (CYSTOCELE) AND POSTERIOR REPAIR (RECTOCELE);  Surgeon: Harlin Heys, MD;  Location: [facility];  Service: Gynecology;  Laterality: N/A;   CATARACT EXTRACTION W/PHACO Left 08/22/2019   Procedure: CATARACT EXTRACTION PHACO AND INTRAOCULAR LENS PLACEMENT (IOC) LEFT  9.98  01:10.4  14.2%;  Surgeon: Leandrew Koyanagi, MD;  Location: [place];  Service: Ophthalmology;  Laterality: Left;   CATARACT EXTRACTION W/PHACO Right 09/19/2019   Procedure: CATARACT EXTRACTION PHACO AND INTRAOCULAR LENS PLACEMENT (IOC) RIGHT PANOPTIX LENS 7.44  00:52.4  14.3%;  Surgeon: Leandrew Koyanagi, MD;  Location: [place];  Service: Ophthalmology;  Laterality: Right;   COLONOSCOPY WITH PROPOFOL N/A 05/09/2018   Procedure: COLONOSCOPY WITH PROPOFOL;  Surgeon: [name], MD;  Location: [facility];  Service: Endoscopy;  Laterality: N/A;   ESOPHAGOGASTRODUODENOSCOPY (EGD) WITH PROPOFOL N/A 02/05/2020   Procedure: ESOPHAGOGASTRODUODENOSCOPY (EGD) WITH PROPOFOL;  Surgeon: [name], MD;  Location: [facility];  Service: Endoscopy;  Laterality: N/A;   EYE SURGERY     FOOT SURGERY Bilateral    KNEE ARTHROSCOPY Right    PUBOVAGINAL SLING N/A 05/11/2021   Procedure: PUBO-VAGINAL SLING (TOT);  Surgeon: Harlin Heys, MD;  Location: [facility];  Service: Gynecology;  Laterality: N/A;    TEAR DUCT PROBING  03/10/2015   TUBAL LIGATION     VAGINAL HYSTERECTOMY N/A 05/11/2021   Procedure: HYSTERECTOMY VAGINAL;  Surgeon: Harlin Heys, MD;  Location: [facility];  Service: Gynecology;  Laterality: N/A;    Family History  Problem Relation Age of Onset   Stroke Mother    Heart failure Mother    Thyroid disease Paternal Grandfather     Social History   Socioeconomic History   Marital status: Married    Spouse name: Not on file   Number of children: Not on file   Years of education: Not on file   Highest education level: Not on file  Occupational History   Not on file  Tobacco Use   Smoking status: Never   Smokeless tobacco: Never  Vaping Use   Vaping Use: Never used  Substance and Sexual Activity   Alcohol use: Not Currently    Alcohol/week: 1.0 standard drink    Types: 1 Glasses of wine per week   Drug use: No   Sexual activity: Not Currently    Birth control/protection: Post-menopausal  Other Topics Concern   Not on file  Social History Narrative   Not on file   Social Determinants of Health   Financial Resource Strain: Medium Risk   Difficulty of Paying Living Expenses: Somewhat hard  Food Insecurity: No Food Insecurity   Worried About Running Out of Food in the Last Year: Never true   Ran Out of Food in the Last Year: Never true  Transportation Needs: No Transportation Needs  Lack of Transportation (Medical): No   Lack of Transportation (Non-Medical): No  Physical Activity: Insufficiently Active   Days of Exercise per Week: 2 days   Minutes of Exercise per Session: 10 min  Stress: No Stress Concern Present   Feeling of Stress : Only a little  Social Connections: Engineer, building services of Communication with Friends and Family: More than three times a week   Frequency of Social Gatherings with Friends and Family: More than three times a week   Attends Religious Services: More than 4 times per year   Active Member of [employer] or  Organizations: Yes   Attends Music therapist: More than 4 times per year   Marital Status: Married  Human resources officer Violence: Not At Risk   Fear of Current or Ex-Partner: No   Emotionally Abused: No   Physically Abused: No   Sexually Abused: No     Current Outpatient Medications:    albuterol (VENTOLIN HFA) 108 (90 Base) MCG/ACT inhaler, Inhale 2 puffs into the lungs every 6 (six) hours as needed for wheezing or shortness of breath., Disp: 8 g, Rfl: 0   atorvastatin (LIPITOR) 40 MG tablet, Take 1 tablet by mouth once daily, Disp: 90 tablet, Rfl: 0   azithromycin (ZITHROMAX) 250 MG tablet, Take 2 tablets on day 1, then 1 tablet daily on days 2 through 5, Disp: 6 tablet, Rfl: 0   Biotin w/ Vitamins C & E (HAIR/SKIN/NAILS PO), Take 1 tablet by mouth daily., Disp: , Rfl:    Cholecalciferol (VITAMIN D3) 25 MCG (1000 UT) CAPS, Take 1,000 Units by mouth daily., Disp: , Rfl:    ELDERBERRY PO, Take 50 mg by mouth daily., Disp: , Rfl:    ibuprofen (ADVIL) 600 MG tablet, Take 1 tablet (600 mg total) by mouth every 6 (six) hours., Disp: 30 tablet, Rfl: 0   IRON-VITAMIN C PO, Take 1 tablet by mouth daily., Disp: , Rfl:    metoprolol succinate (TOPROL-XL) 50 MG 24 hr tablet, Take 50 mg by mouth daily., Disp: , Rfl:    metoprolol tartrate (LOPRESSOR) 50 MG tablet, Take 1 tablet by mouth once daily, Disp: 90 tablet, Rfl: 0   OVER THE COUNTER MEDICATION, Take 1 capsule by mouth daily. Beets, Disp: , Rfl:    Propylene Glycol (SYSTANE BALANCE) 0.6 % SOLN, Place 1 drop into both eyes daily as needed (dry eyes)., Disp: , Rfl:    telmisartan-hydrochlorothiazide (MICARDIS HCT) 80-12.5 MG tablet, Take 1 tablet by mouth once daily, Disp: 30 tablet, Rfl: 0   ASPIRIN 81 PO, Take by mouth every other day. (Patient not taking: Reported on 05/20/2021), Disp: , Rfl:    No Known Allergies  ROS Review of Systems  Respiratory:  Positive for cough.   Endocrine: Negative.   Genitourinary: Negative.   Negative for difficulty urinating.  Musculoskeletal: Negative.   Neurological: Negative.   Hematological: Negative.   Psychiatric/Behavioral: Negative.       Objective:    Physical Exam Vitals reviewed.  Constitutional:      Appearance: Normal appearance.  HENT:     Nose: Congestion present.     Mouth/Throat:     Mouth: Mucous membranes are moist.     Pharynx: Oropharyngeal exudate present.  Eyes:     Pupils: Pupils are equal, round, and reactive to light.  Neck:     Vascular: No carotid bruit.  Cardiovascular:     Rate and Rhythm: Normal rate and regular rhythm.  Pulses: Normal pulses.     Heart sounds: Normal heart sounds.  Pulmonary:     Effort: Pulmonary effort is normal.     Breath sounds: Normal breath sounds.  Abdominal:     General: Bowel sounds are normal.     Palpations: Abdomen is soft. There is no hepatomegaly, splenomegaly or mass.     Tenderness: There is no abdominal tenderness.     Hernia: No hernia is present.  Musculoskeletal:        General: No tenderness.     Cervical back: Neck supple.     Right lower leg: No edema.     Left lower leg: No edema.  Skin:    Findings: No rash.  Neurological:     Mental Status: She is alert and oriented to person, place, and time.     Motor: No weakness.  Psychiatric:        Mood and Affect: Mood and affect normal.        Behavior: Behavior normal.    BP (!) 165/67   Pulse 88   Ht 5\' 2"  (1.575 m)   Wt 157 lb 3.2 oz (71.3 kg)   BMI 28.75 kg/m  Wt Readings from Last 3 Encounters:  08/03/21 157 lb 3.2 oz (71.3 kg)  06/24/21 159 lb 4.8 oz (72.3 kg)  05/20/21 157 lb 4.8 oz (71.4 kg)     Health Maintenance Due  Topic Date Due   Hepatitis C Screening  Never done   Zoster Vaccines- Shingrix (1 of 2) Never done    There are no preventive care reminders to display for this patient.  Lab Results  Component Value Date   TSH 1.520 10/16/2015   Lab Results  Component Value Date   WBC 5.1 05/11/2021    HGB 12.1 05/11/2021   HCT 33.0 (L) 05/11/2021   MCV 86.4 05/11/2021   PLT 177 05/11/2021   Lab Results  Component Value Date   NA 136 05/11/2021   K 3.5 05/11/2021   CO2 27 05/11/2021   GLUCOSE 107 (H) 05/11/2021   BUN 20 05/11/2021   CREATININE 0.91 05/11/2021   BILITOT 0.8 08/08/2020   AST 26 08/08/2020   ALT 20 08/08/2020   PROT 6.0 (L) 08/08/2020   CALCIUM 8.5 (L) 05/11/2021   ANIONGAP 8 05/11/2021   Lab Results  Component Value Date   CHOL 116 08/08/2020   Lab Results  Component Value Date   HDL 37 (L) 08/08/2020   Lab Results  Component Value Date   LDLCALC 56 08/08/2020   Lab Results  Component Value Date   TRIG 157 (H) 08/08/2020   Lab Results  Component Value Date   CHOLHDL 3.1 08/08/2020   No results found for: HGBA1C    Assessment & Plan:   Problem List Items Addressed This Visit       Cardiovascular and Mediastinum   Essential hypertension     Patient denies any chest pain or shortness of breath there is no history of palpitation or paroxysmal nocturnal dyspnea   patient was advised to follow low-salt low-cholesterol diet    ideally I want to keep systolic blood pressure below 130 mmHg, patient was asked to check blood pressure one times a week and give me a report on that.  Patient will be follow-up in 3 months  or earlier as needed, patient will call me back for any change in the cardiovascular symptoms Patient was advised to buy a book from local bookstore concerning blood  pressure and read several chapters  every day.  This will be supplemented by some of the material we will give him from the office.  Patient should also utilize other resources like YouTube and Internet to learn more about the blood pressure and the diet.      Varicose veins of bilateral lower extremities with other complications    Stable at the present time        Other   Cough    We will send the patient some Z-Pak      Other Visit Diagnoses     Suspected  COVID-19 virus infection    -  Primary   Relevant Orders   POC COVID-19 (Completed)       Meds ordered this encounter  Medications   azithromycin (ZITHROMAX) 250 MG tablet    Sig: Take 2 tablets on day 1, then 1 tablet daily on days 2 through 5    Dispense:  6 tablet    Refill:  0    Follow-up: No follow-ups on file.    [name], MD

## 2021-08-03 NOTE — Assessment & Plan Note (Signed)
Stable at the present time. 

## 2021-08-03 NOTE — Assessment & Plan Note (Signed)

## 2021-08-03 NOTE — Assessment & Plan Note (Signed)
We will send the patient some Z-Pak

## 2021-08-12 ENCOUNTER — Other Ambulatory Visit: Payer: Self-pay

## 2021-08-12 MED ORDER — ALBUTEROL SULFATE HFA 108 (90 BASE) MCG/ACT IN AERS: 2.0000 | INHALATION_SPRAY | Freq: Four times a day (QID) | RESPIRATORY_TRACT | 0 refills | Status: AC | PRN

## 2021-09-03 ENCOUNTER — Other Ambulatory Visit: Payer: Self-pay | Admitting: Internal Medicine

## 2021-09-24 ENCOUNTER — Encounter: Payer: Self-pay | Admitting: Obstetrics and Gynecology

## 2021-10-01 ENCOUNTER — Encounter: Payer: Self-pay | Admitting: Obstetrics and Gynecology

## 2021-10-01 ENCOUNTER — Ambulatory Visit: Payer: PPO | Admitting: Obstetrics and Gynecology

## 2021-10-01 ENCOUNTER — Other Ambulatory Visit: Payer: Self-pay

## 2021-10-01 VITALS — BP 174/76 | HR 77 | Ht 62.0 in | Wt 160.5 lb

## 2021-10-01 DIAGNOSIS — Z9889 Other specified postprocedural states: Secondary | ICD-10-CM

## 2021-10-01 NOTE — Progress Notes (Signed)
HPI:      [name] is a 76 y.o. [id number] who LMP was No LMP recorded. Patient is postmenopausal.  Subjective:   She presents today approximately 3 months from her surgery.  She reports that she is not having any difficulty.  She denies pain, denies urinary issues, denies bowel movement problems.  She in fact states that her urine loss has become negligible and she no longer has to wear " pull-ups"    Hx: The following portions of the patient's history were reviewed and updated as appropriate:             She  has a past medical history of COVID-19 (2021), GERD (gastroesophageal reflux disease), Hypertension, and Wears dentures. She does not have any pertinent problems on file. She  has a past surgical history that includes Tear duct probing (03/10/2015); Tubal ligation; Colonoscopy with propofol (N/A, 05/09/2018); Cataract extraction w/PHACO (Left, 08/22/2019); Cataract extraction w/PHACO (Right, 09/19/2019); Eye surgery; Esophagogastroduodenoscopy (egd) with propofol (N/A, 02/05/2020); Knee arthroscopy (Right); Foot surgery (Bilateral); Vaginal hysterectomy (N/A, 05/11/2021); Anterior and posterior repair (N/A, 05/11/2021); and Pubovaginal sling (N/A, 05/11/2021). Her family history includes Heart failure in her mother; Stroke in her mother; Thyroid disease in her paternal grandfather. She  reports that she has never smoked. She has never used smokeless tobacco. She reports that she does not currently use alcohol after a past usage of about 1.0 standard drink per week. She reports that she does not use drugs. She has a current medication list which includes the following prescription(s): albuterol, atorvastatin, biotin w/ vitamins c & e, vitamin d3, glucosamine, iron-vitamin c, metoprolol succinate, metoprolol tartrate, systane balance, elderberry, ibuprofen, OVER THE COUNTER MEDICATION, and telmisartan-hydrochlorothiazide. She has No Known Allergies.       Review of Systems:  Review of  Systems  Constitutional: Denied constitutional symptoms, night sweats, recent illness, fatigue, fever, insomnia and weight loss.  Eyes: Denied eye symptoms, eye pain, photophobia, vision change and visual disturbance.  Ears/Nose/Throat/Neck: Denied ear, nose, throat or neck symptoms, hearing loss, nasal discharge, sinus congestion and sore throat.  Cardiovascular: Denied cardiovascular symptoms, arrhythmia, chest pain/pressure, edema, exercise intolerance, orthopnea and palpitations.  Respiratory: Denied pulmonary symptoms, asthma, pleuritic pain, productive sputum, cough, dyspnea and wheezing.  Gastrointestinal: Denied, gastro-esophageal reflux, melena, nausea and vomiting.  Genitourinary: Denied genitourinary symptoms including symptomatic vaginal discharge, pelvic relaxation issues, and urinary complaints.  Musculoskeletal: Denied musculoskeletal symptoms, stiffness, swelling, muscle weakness and myalgia.  Dermatologic: Denied dermatology symptoms, rash and scar.  Neurologic: Denied neurology symptoms, dizziness, headache, neck pain and syncope.  Psychiatric: Denied psychiatric symptoms, anxiety and depression.  Endocrine: Denied endocrine symptoms including hot flashes and night sweats.   Meds:   Current Outpatient Medications on File Prior to Visit  Medication Sig Dispense Refill   albuterol (VENTOLIN HFA) 108 (90 Base) MCG/ACT inhaler Inhale 2 puffs into the lungs every 6 (six) hours as needed for wheezing or shortness of breath. 8 g 0   atorvastatin (LIPITOR) 40 MG tablet Take 1 tablet by mouth once daily 90 tablet 0   Biotin w/ Vitamins C & E (HAIR/SKIN/NAILS PO) Take 1 tablet by mouth daily.     Cholecalciferol (VITAMIN D3) 25 MCG (1000 UT) CAPS Take 1,000 Units by mouth daily.     Glucosamine 500 MG CAPS Take by mouth.     IRON-VITAMIN C PO Take 1 tablet by mouth daily.     metoprolol succinate (TOPROL-XL) 50 MG 24 hr tablet Take 50 mg by mouth  daily.     metoprolol tartrate  (LOPRESSOR) 50 MG tablet Take 1 tablet by mouth once daily 90 tablet 0   Propylene Glycol (SYSTANE BALANCE) 0.6 % SOLN Place 1 drop into both eyes daily as needed (dry eyes).     ELDERBERRY PO Take 50 mg by mouth daily. (Patient not taking: Reported on 10/01/2021)     ibuprofen (ADVIL) 600 MG tablet Take 1 tablet (600 mg total) by mouth every 6 (six) hours. (Patient not taking: Reported on 10/01/2021) 30 tablet 0   OVER THE COUNTER MEDICATION Take 1 capsule by mouth daily. Beets (Patient not taking: Reported on 10/01/2021)     telmisartan-hydrochlorothiazide (MICARDIS HCT) 80-12.5 MG tablet Take 1 tablet by mouth once daily (Patient not taking: Reported on 10/01/2021) 30 tablet 0   No current facility-administered medications on file prior to visit.      Objective:     Vitals:   10/01/21 1325  BP: (!) 174/76  Pulse: 77   Filed Weights   10/01/21 1325  Weight: 160 lb 8 oz (72.8 kg)                        Assessment:    [id number] Patient Active Problem List   Diagnosis Date Noted   Complete uterovaginal prolapse 05/11/2021   Post-operative state 05/11/2021   Cough 12/05/2020   Shortness of breath 11/06/2020   Dizziness 07/01/2020   Sciatica of left side 06/13/2020   Claudication ([place]) 06/13/2020   COVID-19 virus infection 05/29/2020   Dermatitis 04/17/2020   Acute gastritis without hemorrhage    Varicose veins of bilateral lower extremities with other complications 15/17/6160   Chronic venous insufficiency 08/07/2018   Hyperlipidemia 08/07/2018   Annual physical exam    Polyp of sigmoid colon    Pessary maintenance 04/24/2015   Cystocele with uterine prolapse 04/24/2015   Essential hypertension 03/13/2015   Acid reflux 03/13/2015     1. Post-operative state     Patient doing very well.-No issues   Plan:            1.  May resume all normal activities. Orders No orders of the defined types were placed in this encounter.   No orders of the defined types were placed  in this encounter.     F/U  Return in about 8 months (around 05/31/2022) for Annual Physical. I spent 18 minutes involved in the care of this patient preparing to see the patient by obtaining and reviewing her medical history (including labs, imaging tests and prior procedures), documenting clinical information in the electronic health record (EHR), counseling and coordinating care plans, writing and sending prescriptions, ordering tests or procedures and in direct communicating with the patient and medical staff discussing pertinent items from her history and physical exam.  Finis Bud, M.D. 10/01/2021 1:51 PM

## 2021-10-01 NOTE — Progress Notes (Signed)
Patient presents today for 3 month follow-up post hysterectomy. Patient states she is feeling well with no complaints of pain or bleeding.

## 2021-10-16 DIAGNOSIS — H15102 Unspecified episcleritis, left eye: Secondary | ICD-10-CM | POA: Diagnosis not present

## 2021-10-16 IMAGING — MR MR BRAIN/IAC WO/W CM
10 of 13 series · 26 of 48 positions shown · IV contrast (gadavist)
Comparison: Head CT October 08, 2014.

CLINICAL DATA: Right-sided tinnitus. Sensorineural hearing loss of
right ear.

EXAM:
MRI HEAD WITHOUT AND WITH CONTRAST
TECHNIQUE: Multiplanar, multiecho pulse sequences of the brain and surrounding
structures were obtained without and with intravenous contrast.
CONTRAST:  7mL GADAVIST GADOBUTROL 1 MMOL/ML IV SOLN

[Series 5: T1 · sagittal · 5.0mm · 0.62mm/px · 1 of 25 slices shown (1 of 3)]
[im 1/25]
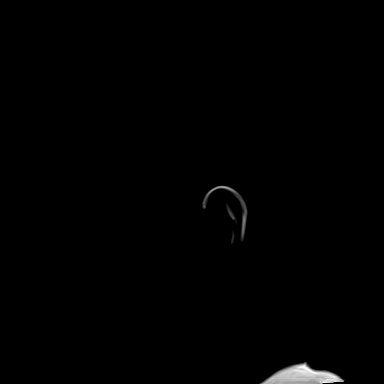

[Series 6: ax dwi_tracew · axial · 3.0mm · 0.60mm/px · z∈[-102,+53]mm · 4 of 48 slices shown]
[im 1/48]
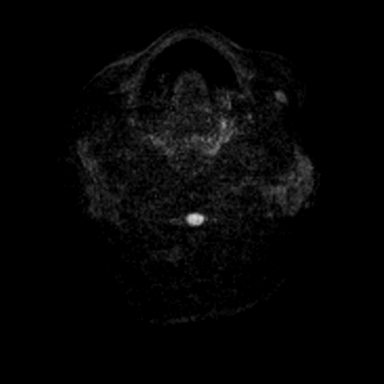
[im 16/48]
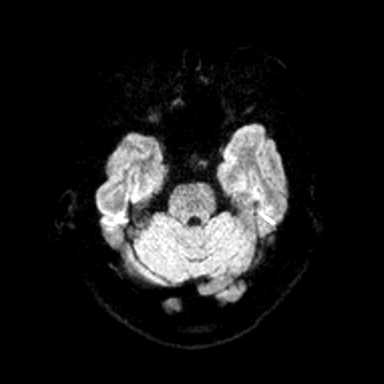
[im 32/48]
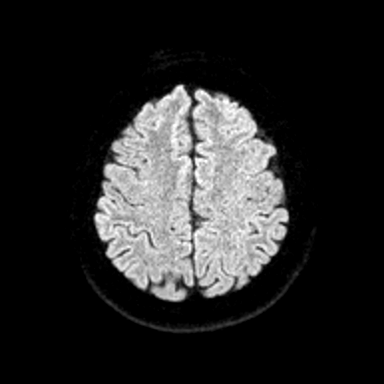
[im 48/48]
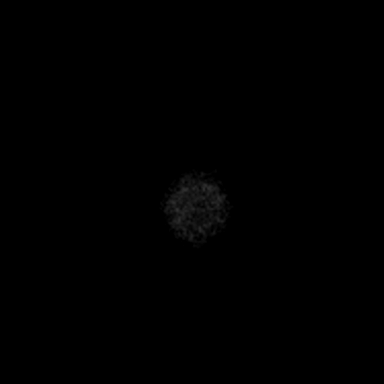

[Series 7: ax dwi_adc · axial · 3.0mm · 0.60mm/px · z∈[-102,+0]mm · 3 of 48 slices shown]
[im 1/48]
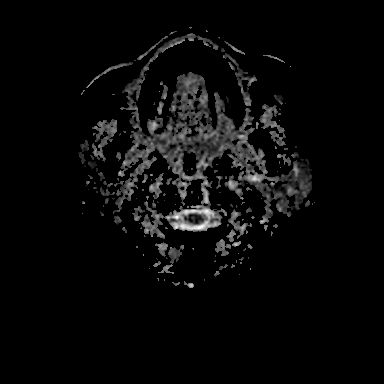
[im 16/48]
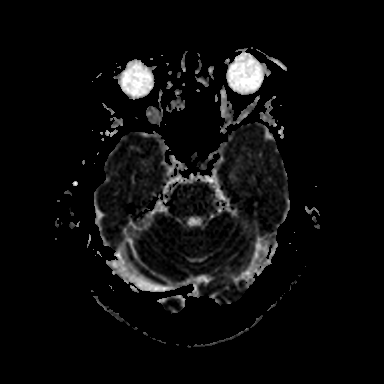
[im 32/48]
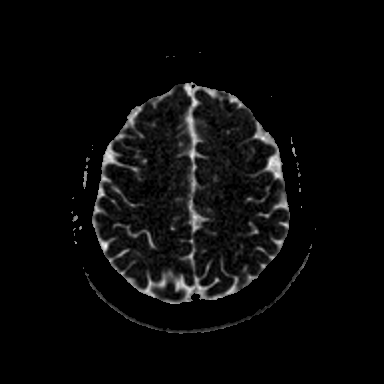

[Series 8: T2 · axial · 5.0mm · 0.53mm/px · z∈[-99,+51]mm · 2 of 26 slices shown]
[im 1/26]
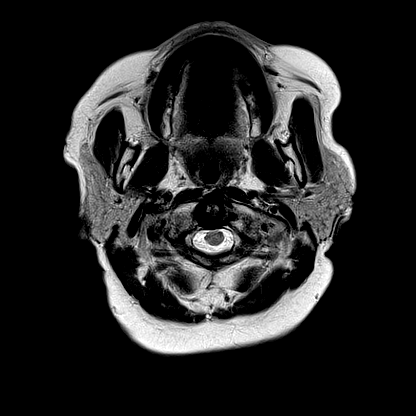
[im 26/26]
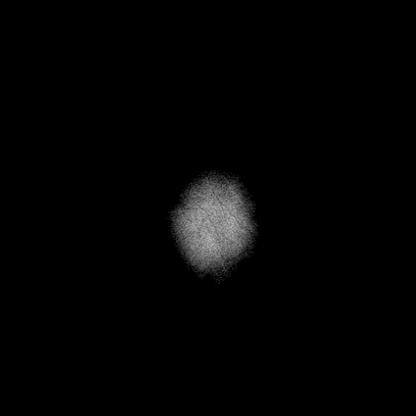

[Series 13: FLAIR · axial · 3.0mm · 0.53mm/px · z∈[-105,+57]mm · 4 of 55 slices shown]
[im 1/55]
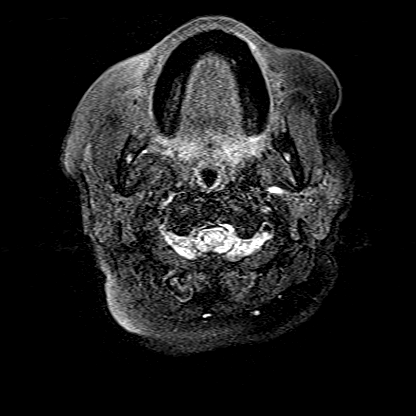
[im 19/55]
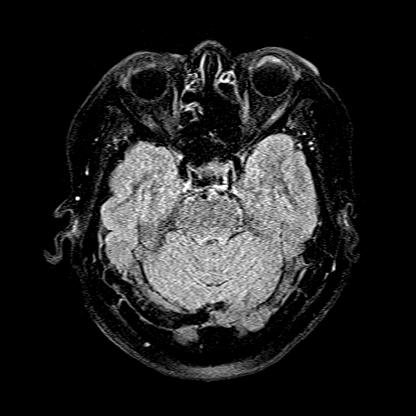
[im 37/55]
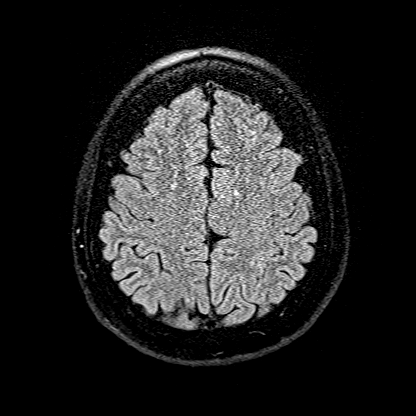
[im 55/55]
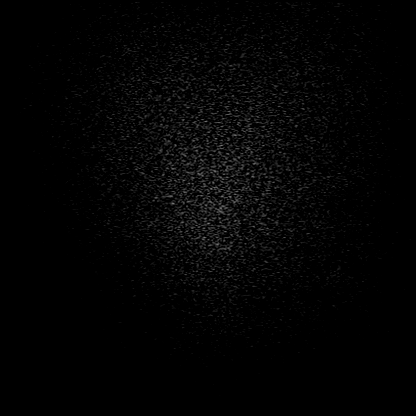

[Series 14: T1 · coronal · non-contrast · 3.0mm · 0.21mm/px · 1 of 13 slices shown (2 of 3)]
[im 1/13]
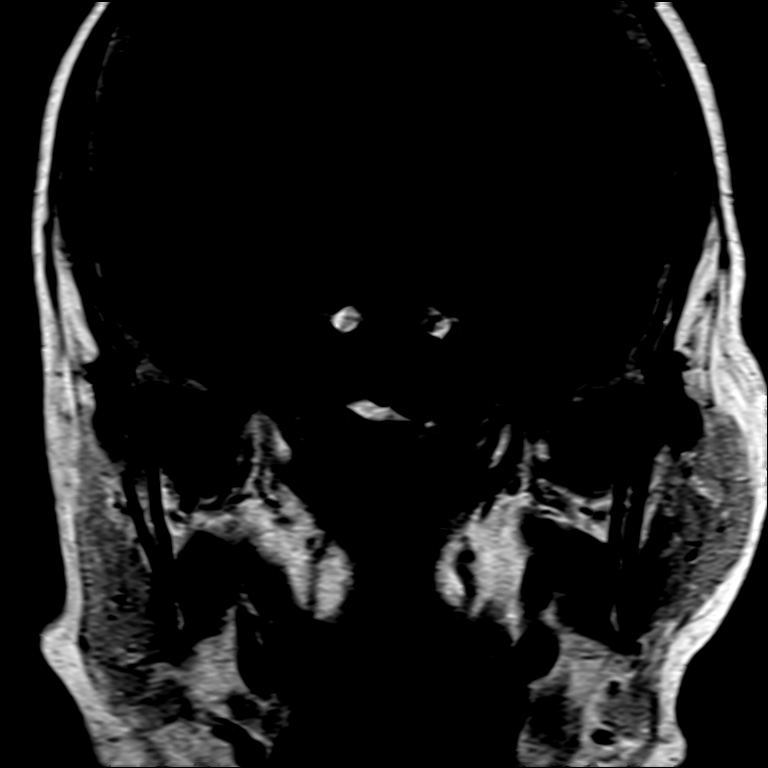

[Series 16: T1 · axial · non-contrast · 3.0mm · 0.21mm/px · 1 of 15 slices shown (3 of 3)]
[im 1/15]
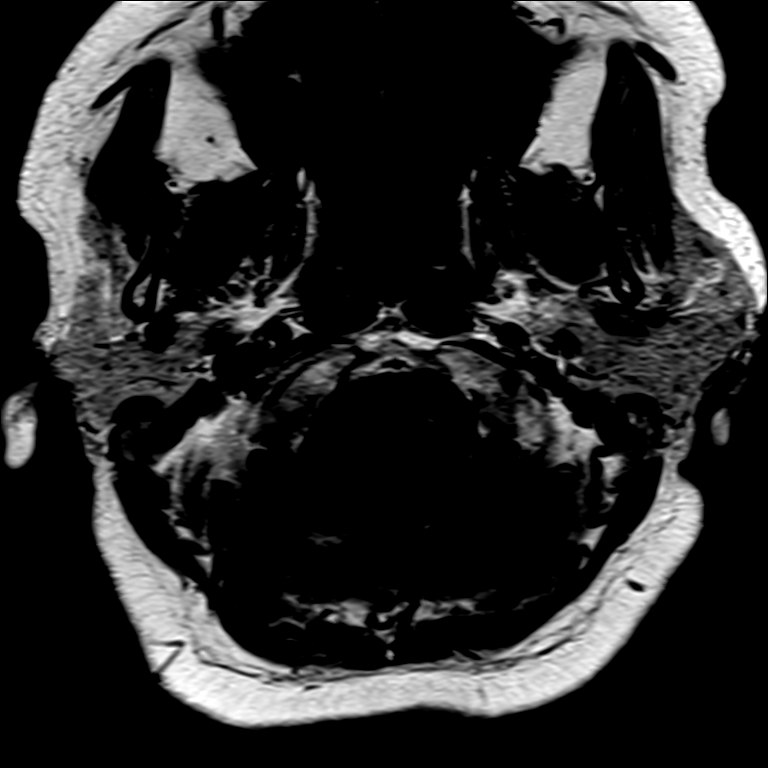

[Series 17: T1 post-contrast · axial · 3.0mm · 0.21mm/px · 1 of 15 slices shown (1 of 3)]
[im 1/15]
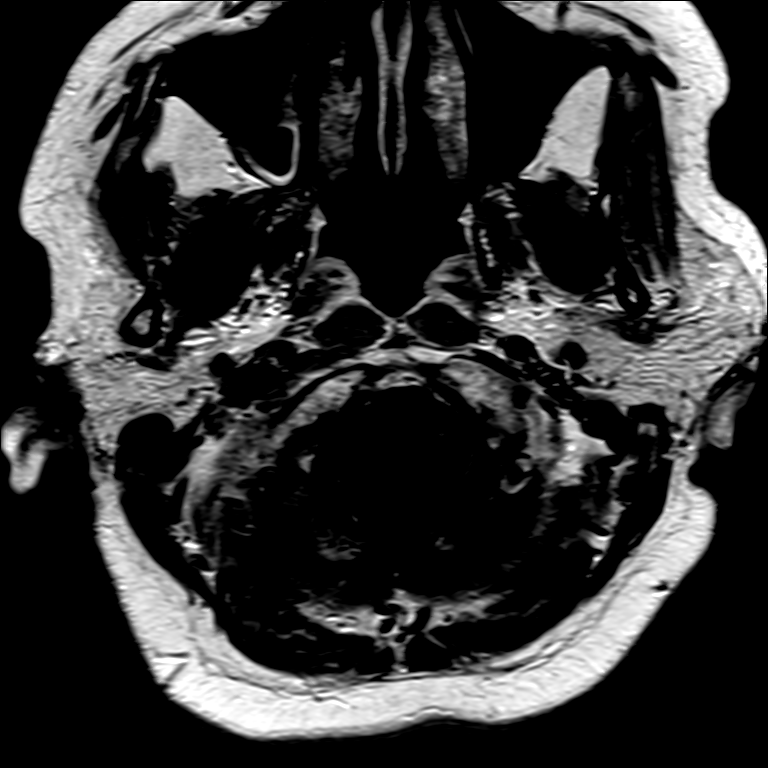

[Series 18: T1 post-contrast · coronal · 3.0mm · 0.21mm/px · 1 of 13 slices shown (2 of 3)]
[im 1/13]
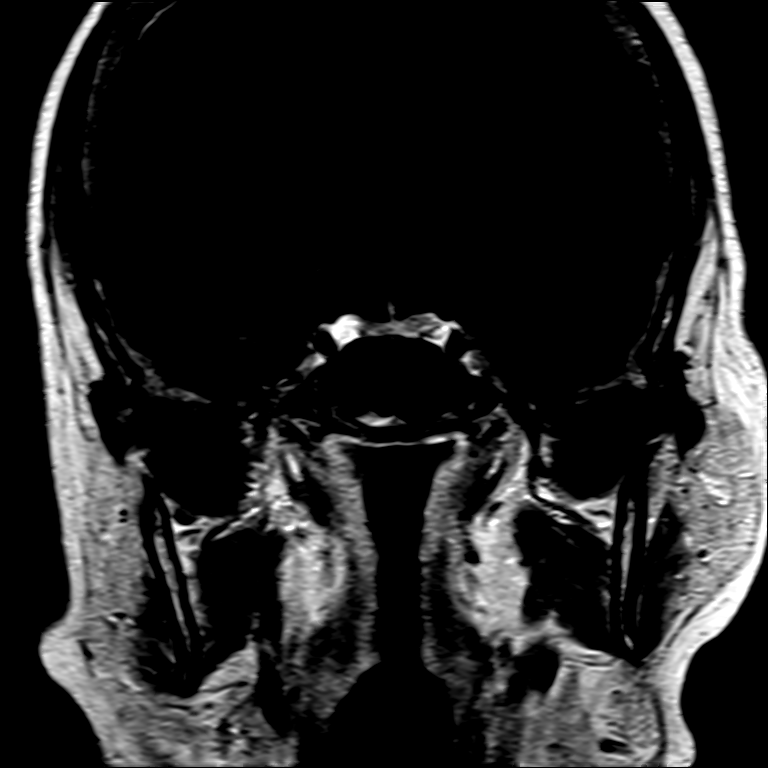

[Series 19: T1 post-contrast · axial · 1.0mm · 0.98mm/px · z∈[-112,+63]mm · 8 of 176 slices shown (3 of 3)]
[im 1/176]
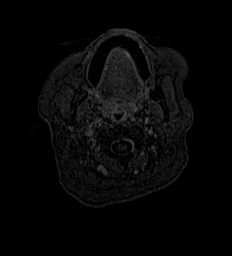
[im 27/176]
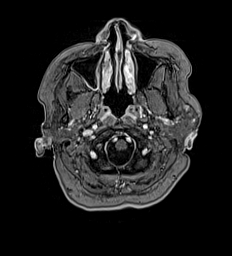
[im 54/176]
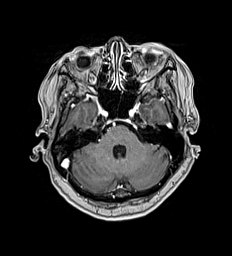
[im 81/176]
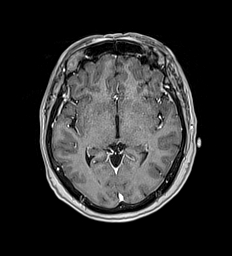
[im 95/176]
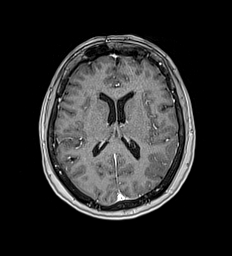
[im 122/176]
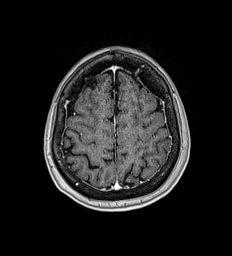
[im 149/176]
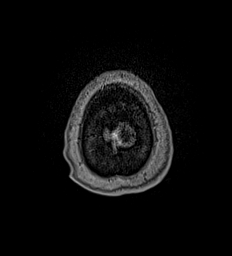
[im 176/176]
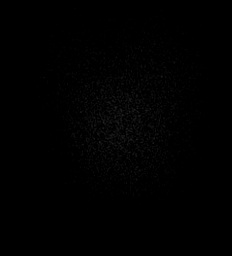

[26 of 48 positions shown; findings below may reference images not displayed]

FINDINGS: Brain: No acute infarction, hemorrhage, hydrocephalus, extra-axial
collection or mass lesion. Scattered foci of T2 hyperintensity are
seen within the white matter of the cerebral hemispheres,
nonspecific.

No cerebellopontine angle mass or internal auditory canal lesion is
demonstrated.Normal appearance of the 7th and 8th cranial nerves
bilaterally. No focus of abnormal contrast enhancement.

Vascular: Normal flow voids.

Skull and upper cervical spine: Normal marrow signal.

Sinuses/Orbits: Mild mucosal thickening of the right maxillary
sinus. The orbits are maintained.

Other: Right mastoid effusion.
IMPRESSION: 1. No acute intracranial abnormality.
2. No cerebellopontine angle mass or internal auditory canal lesion.
3. Scattered foci of T2 hyperintensity within the white matter of
the cerebral hemispheres, nonspecific but may represent chronic
microvascular ischemic changes.
4. Right mastoid effusion.

## 2021-11-02 ENCOUNTER — Other Ambulatory Visit: Payer: Self-pay | Admitting: Internal Medicine

## 2021-11-18 ENCOUNTER — Other Ambulatory Visit: Payer: Self-pay | Admitting: Internal Medicine

## 2022-01-12 ENCOUNTER — Other Ambulatory Visit: Payer: Self-pay | Admitting: Internal Medicine

## 2022-02-10 ENCOUNTER — Ambulatory Visit: Payer: PPO | Admitting: Internal Medicine

## 2022-02-16 ENCOUNTER — Ambulatory Visit (INDEPENDENT_AMBULATORY_CARE_PROVIDER_SITE_OTHER): Payer: PPO | Admitting: Internal Medicine

## 2022-02-16 ENCOUNTER — Encounter: Payer: Self-pay | Admitting: Internal Medicine

## 2022-02-16 VITALS — BP 186/88 | HR 97 | Ht 62.0 in | Wt 161.8 lb

## 2022-02-16 DIAGNOSIS — K219 Gastro-esophageal reflux disease without esophagitis: Secondary | ICD-10-CM | POA: Diagnosis not present

## 2022-02-16 DIAGNOSIS — I83893 Varicose veins of bilateral lower extremities with other complications: Secondary | ICD-10-CM

## 2022-02-16 DIAGNOSIS — I872 Venous insufficiency (chronic) (peripheral): Secondary | ICD-10-CM

## 2022-02-16 DIAGNOSIS — E782 Mixed hyperlipidemia: Secondary | ICD-10-CM

## 2022-02-16 DIAGNOSIS — I10 Essential (primary) hypertension: Secondary | ICD-10-CM | POA: Diagnosis not present

## 2022-02-16 NOTE — Assessment & Plan Note (Signed)
Patient educated extensively on acid reflux lifestyle modification, including buying a bed wedge, not eating 3 hrs before bedtime, diet modifications, and handout given for the same.  

## 2022-02-16 NOTE — Progress Notes (Signed)
Established Patient Office Visit  Subjective:  Patient ID: Amanda Cruz, female    DOB: [birth year]  Age: 76 y.o. MRN: [id number]  CC:  Chief Complaint  Patient presents with   Follow-up    HPI  Amanda Cruz presents for check up  Past Medical History:  Diagnosis Date   COVID-19 2021   GERD (gastroesophageal reflux disease)    Hypertension    Wears dentures    full upper and lower    Past Surgical History:  Procedure Laterality Date   ANTERIOR AND POSTERIOR REPAIR N/A 05/11/2021   Procedure: ANTERIOR (CYSTOCELE) AND POSTERIOR REPAIR (RECTOCELE);  Surgeon: Harlin Heys, MD;  Location: [facility];  Service: Gynecology;  Laterality: N/A;   CATARACT EXTRACTION W/PHACO Left 08/22/2019   Procedure: CATARACT EXTRACTION PHACO AND INTRAOCULAR LENS PLACEMENT (IOC) LEFT  9.98  01:10.4  14.2%;  Surgeon: Leandrew Koyanagi, MD;  Location: [place];  Service: Ophthalmology;  Laterality: Left;   CATARACT EXTRACTION W/PHACO Right 09/19/2019   Procedure: CATARACT EXTRACTION PHACO AND INTRAOCULAR LENS PLACEMENT (IOC) RIGHT PANOPTIX LENS 7.44  00:52.4  14.3%;  Surgeon: Leandrew Koyanagi, MD;  Location: [place];  Service: Ophthalmology;  Laterality: Right;   COLONOSCOPY WITH PROPOFOL N/A 05/09/2018   Procedure: COLONOSCOPY WITH PROPOFOL;  Surgeon: [name], MD;  Location: [facility];  Service: Endoscopy;  Laterality: N/A;   ESOPHAGOGASTRODUODENOSCOPY (EGD) WITH PROPOFOL N/A 02/05/2020   Procedure: ESOPHAGOGASTRODUODENOSCOPY (EGD) WITH PROPOFOL;  Surgeon: [name], MD;  Location: [facility]OS[facility];  Service: Endoscopy;  Laterality: N/A;   EYE SURGERY     FOOT SURGERY Bilateral    KNEE ARTHROSCOPY Right    PUBOVAGINAL SLING N/A 05/11/2021   Procedure: PUBO-VAGINAL SLING (TOT);  Surgeon: Harlin Heys, MD;  Location: [facility];  Service: Gynecology;  Laterality: N/A;   TEAR DUCT PROBING  03/10/2015   TUBAL LIGATION     VAGINAL HYSTERECTOMY N/A  05/11/2021   Procedure: HYSTERECTOMY VAGINAL;  Surgeon: Harlin Heys, MD;  Location: [facility];  Service: Gynecology;  Laterality: N/A;    Family History  Problem Relation Age of Onset   Stroke Mother    Heart failure Mother    Thyroid disease Paternal Grandfather     Social History   Socioeconomic History   Marital status: Married    Spouse name: Not on file   Number of children: Not on file   Years of education: Not on file   Highest education level: Not on file  Occupational History   Not on file  Tobacco Use   Smoking status: Never   Smokeless tobacco: Never  Vaping Use   Vaping Use: Never used  Substance and Sexual Activity   Alcohol use: Not Currently    Alcohol/week: 1.0 standard drink of alcohol    Types: 1 Glasses of wine per week   Drug use: No   Sexual activity: Not Currently    Birth control/protection: Post-menopausal  Other Topics Concern   Not on file  Social History Narrative   Not on file   Social Determinants of Health   Financial Resource Strain: Medium Risk (05/28/2021)   Overall Financial Resource Strain (CARDIA)    Difficulty of Paying Living Expenses: Somewhat hard  Food Insecurity: No Food Insecurity (05/28/2021)   Hunger Vital Sign    Worried About Running Out of Food in the Last Year: Never true    Ran Out of Food in the Last Year: Never true  Transportation Needs: No Transportation Needs (  05/28/2021)   PRAPARE - Hydrologist (Medical): No    Lack of Transportation (Non-Medical): No  Physical Activity: Insufficiently Active (05/28/2021)   Exercise Vital Sign    Days of Exercise per Week: 2 days    Minutes of Exercise per Session: 10 min  Stress: No Stress Concern Present (05/28/2021)   [place]    Feeling of Stress : Only a little  Social Connections: Socially Integrated (05/28/2021)   Social Connection and Isolation Panel [NHANES]     Frequency of Communication with Friends and Family: More than three times a week    Frequency of Social Gatherings with Friends and Family: More than three times a week    Attends Religious Services: More than 4 times per year    Active Member of [employer] or Organizations: Yes    Attends Music therapist: More than 4 times per year    Marital Status: Married  Human resources officer Violence: Not At Risk (05/28/2021)   Humiliation, Afraid, Rape, and Kick questionnaire    Fear of Current or Ex-Partner: No    Emotionally Abused: No    Physically Abused: No    Sexually Abused: No     Current Outpatient Medications:    albuterol (VENTOLIN HFA) 108 (90 Base) MCG/ACT inhaler, Inhale 2 puffs into the lungs every 6 (six) hours as needed for wheezing or shortness of breath., Disp: 8 g, Rfl: 0   atorvastatin (LIPITOR) 40 MG tablet, Take 1 tablet by mouth once daily, Disp: 90 tablet, Rfl: 0   Biotin w/ Vitamins C & E (HAIR/SKIN/NAILS PO), Take 1 tablet by mouth daily., Disp: , Rfl:    Cholecalciferol (VITAMIN D3) 25 MCG (1000 UT) CAPS, Take 1,000 Units by mouth daily., Disp: , Rfl:    Glucosamine 500 MG CAPS, Take by mouth., Disp: , Rfl:    IRON-VITAMIN C PO, Take 1 tablet by mouth daily., Disp: , Rfl:    metoprolol succinate (TOPROL-XL) 50 MG 24 hr tablet, Take 50 mg by mouth daily., Disp: , Rfl:    metoprolol tartrate (LOPRESSOR) 50 MG tablet, Take 1 tablet by mouth once daily, Disp: 90 tablet, Rfl: 0   Propylene Glycol (SYSTANE BALANCE) 0.6 % SOLN, Place 1 drop into both eyes daily as needed (dry eyes)., Disp: , Rfl:    telmisartan-hydrochlorothiazide (MICARDIS HCT) 80-12.5 MG tablet, Take 1 tablet by mouth once daily, Disp: 30 tablet, Rfl: 0   No Known Allergies  ROS Review of Systems  Constitutional: Negative.   HENT: Negative.    Eyes: Negative.   Respiratory: Negative.    Cardiovascular: Negative.   Gastrointestinal: Negative.   Endocrine: Negative.   Genitourinary: Negative.    Musculoskeletal: Negative.   Skin: Negative.   Allergic/Immunologic: Negative.   Neurological: Negative.   Hematological: Negative.   Psychiatric/Behavioral: Negative.    All other systems reviewed and are negative.     Objective:    Physical Exam Vitals reviewed.  Constitutional:      Appearance: Normal appearance.  HENT:     Mouth/Throat:     Mouth: Mucous membranes are moist.  Eyes:     Pupils: Pupils are equal, round, and reactive to light.  Neck:     Vascular: No carotid bruit.  Cardiovascular:     Rate and Rhythm: Normal rate and regular rhythm.     Pulses: Normal pulses.     Heart sounds: Normal heart sounds.  Pulmonary:  Effort: Pulmonary effort is normal.     Breath sounds: Normal breath sounds.  Abdominal:     General: Bowel sounds are normal.     Palpations: Abdomen is soft. There is no hepatomegaly, splenomegaly or mass.     Tenderness: There is no abdominal tenderness.     Hernia: No hernia is present.  Musculoskeletal:        General: No tenderness.     Cervical back: Neck supple.     Right lower leg: No edema.     Left lower leg: No edema.  Skin:    Findings: No rash.  Neurological:     Mental Status: She is alert and oriented to person, place, and time.     Motor: No weakness.  Psychiatric:        Mood and Affect: Mood and affect normal.        Behavior: Behavior normal.     BP (!) 186/88   Pulse 97   Ht '5\' 2"'$  (1.575 m)   Wt 161 lb 12.8 oz (73.4 kg)   BMI 29.59 kg/m  Wt Readings from Last 3 Encounters:  02/16/22 161 lb 12.8 oz (73.4 kg)  10/01/21 160 lb 8 oz (72.8 kg)  08/03/21 157 lb 3.2 oz (71.3 kg)     Health Maintenance Due  Topic Date Due   Hepatitis C Screening  Never done   Zoster Vaccines- Shingrix (1 of 2) Never done   TETANUS/TDAP  08/21/2021   COVID-19 Vaccine (5 - Pfizer series) 08/31/2021    There are no preventive care reminders to display for this patient.  Lab Results  Component Value Date   TSH 1.520  10/16/2015   Lab Results  Component Value Date   WBC 5.1 05/11/2021   HGB 12.1 05/11/2021   HCT 33.0 (L) 05/11/2021   MCV 86.4 05/11/2021   PLT 177 05/11/2021   Lab Results  Component Value Date   NA 136 05/11/2021   K 3.5 05/11/2021   CO2 27 05/11/2021   GLUCOSE 107 (H) 05/11/2021   BUN 20 05/11/2021   CREATININE 0.91 05/11/2021   BILITOT 0.8 08/08/2020   AST 26 08/08/2020   ALT 20 08/08/2020   PROT 6.0 (L) 08/08/2020   CALCIUM 8.5 (L) 05/11/2021   ANIONGAP 8 05/11/2021   Lab Results  Component Value Date   CHOL 116 08/08/2020   Lab Results  Component Value Date   HDL 37 (L) 08/08/2020   Lab Results  Component Value Date   LDLCALC 56 08/08/2020   Lab Results  Component Value Date   TRIG 157 (H) 08/08/2020   Lab Results  Component Value Date   CHOLHDL 3.1 08/08/2020   No results found for: "HGBA1C"    Assessment & Plan:   Problem List Items Addressed This Visit       Cardiovascular and Mediastinum   Essential hypertension - Primary    The following hypertensive lifestyle modification were recommended and discussed:  1. Limiting alcohol intake to less than 1 oz/day of ethanol:(24 oz of beer or 8 oz of wine or 2 oz of 100-proof whiskey). 2. Take baby ASA 81 mg daily. 3. Importance of regular aerobic exercise and losing weight. 4. Reduce dietary saturated fat and cholesterol intake for overall cardiovascular health. 5. Maintaining adequate dietary potassium, calcium, and magnesium intake. 6. Regular monitoring of the blood pressure. 7. Reduce sodium intake to less than 100 mmol/day (less than 2.3 gm of sodium or less than 6 gm of  sodium choride)       Chronic venous insufficiency    Patient has a chronic venous insufficiency with dermatitis.  She will be [facility] to [facility].      Varicose veins of bilateral lower extremities with other complications     Digestive   Acid reflux    Patient educated extensively on acid reflux lifestyle  modification, including buying a bed wedge, not eating 3 hrs before bedtime, diet modifications, and handout given for the same.         Other   Hyperlipidemia  Hypercholesterolemia  I advised the patient to follow Mediterranean diet This diet is rich in fruits vegetables and whole grain, and This diet is also rich in fish and lean meat Patient should also eat a handful of almonds or walnuts daily Recent heart study indicated that average follow-up on this kind of diet reduces the cardiovascular mortality by 50 to 70%==  No orders of the defined types were placed in this encounter.   Follow-up: No follow-ups on file.    [name], MD

## 2022-02-16 NOTE — Addendum Note (Signed)
Addended by: Anson [place] R on: 02/16/2022 10:45 AM   Modules accepted: Orders

## 2022-02-16 NOTE — Assessment & Plan Note (Signed)
Patient has a chronic venous insufficiency with dermatitis.  She will be referred to [facility].

## 2022-02-16 NOTE — Assessment & Plan Note (Signed)

## 2022-02-17 ENCOUNTER — Other Ambulatory Visit: Payer: Self-pay | Admitting: Internal Medicine

## 2022-02-17 LAB — COMPLETE METABOLIC PANEL WITH GFR
AG Ratio: 1.8 (calc) (ref 1.0–2.5)
ALT: 16 U/L (ref 6–29)
AST: 21 U/L (ref 10–35)
Albumin: 4.3 g/dL (ref 3.6–5.1)
Alkaline phosphatase (APISO): 79 U/L (ref 37–153)
BUN: 17 mg/dL (ref 7–25)
CO2: 26 mmol/L (ref 20–32)
Calcium: 9 mg/dL (ref 8.6–10.4)
Chloride: 104 mmol/L (ref 98–110)
Creat: 0.88 mg/dL (ref 0.60–1.00)
Globulin: 2.4 g/dL (calc) (ref 1.9–3.7)
Glucose, Bld: 97 mg/dL (ref 65–99)
Potassium: 4 mmol/L (ref 3.5–5.3)
Sodium: 142 mmol/L (ref 135–146)
Total Bilirubin: 0.7 mg/dL (ref 0.2–1.2)
Total Protein: 6.7 g/dL (ref 6.1–8.1)
eGFR: 68 mL/min/{1.73_m2} (ref >=60)

## 2022-02-17 LAB — CBC WITH DIFFERENTIAL/PLATELET
Absolute Monocytes: 361 cells/uL (ref 200–950)
Basophils Absolute: 29 cells/uL (ref 0–200)
Basophils Relative: 0.7 %
Eosinophils Absolute: 111 cells/uL (ref 15–500)
Eosinophils Relative: 2.7 %
HCT: 39.5 % (ref 35.0–45.0)
Hemoglobin: 13.5 g/dL (ref 11.7–15.5)
Lymphs Abs: 918 cells/uL (ref 850–3900)
MCH: 30.7 pg (ref 27.0–33.0)
MCHC: 34.2 g/dL (ref 32.0–36.0)
MCV: 89.8 fL (ref 80.0–100.0)
MPV: 9 fL (ref 7.5–12.5)
Monocytes Relative: 8.8 %
Neutro Abs: 2681 cells/uL (ref 1500–7800)
Neutrophils Relative %: 65.4 %
Platelets: 191 10*3/uL (ref 140–400)
RBC: 4.4 10*6/uL (ref 3.80–5.10)
RDW: 12.6 % (ref 11.0–15.0)
Total Lymphocyte: 22.4 %
WBC: 4.1 10*3/uL (ref 3.8–10.8)

## 2022-02-17 LAB — LIPID PANEL
Cholesterol: 108 mg/dL (ref <200)
HDL: 41 mg/dL — ABNORMAL LOW (ref >=50)
LDL Cholesterol (Calc): 46 mg/dL (calc)
Non-HDL Cholesterol (Calc): 67 mg/dL (calc) (ref <130)
Total CHOL/HDL Ratio: 2.6 (calc) (ref <5.0)
Triglycerides: 128 mg/dL (ref <150)

## 2022-02-17 LAB — TSH: TSH: 2.24 mIU/L (ref 0.40–4.50)

## 2022-02-17 LAB — HEPATITIS C ANTIBODY: Hepatitis C Ab: NONREACTIVE

## 2022-02-22 ENCOUNTER — Ambulatory Visit (INDEPENDENT_AMBULATORY_CARE_PROVIDER_SITE_OTHER): Payer: PPO | Admitting: Internal Medicine

## 2022-02-22 ENCOUNTER — Encounter: Payer: Self-pay | Admitting: Internal Medicine

## 2022-02-22 VITALS — BP 140/80 | HR 70 | Ht 62.0 in | Wt 162.6 lb

## 2022-02-22 DIAGNOSIS — E782 Mixed hyperlipidemia: Secondary | ICD-10-CM

## 2022-02-22 DIAGNOSIS — M5432 Sciatica, left side: Secondary | ICD-10-CM

## 2022-02-22 DIAGNOSIS — I10 Essential (primary) hypertension: Secondary | ICD-10-CM | POA: Diagnosis not present

## 2022-02-22 DIAGNOSIS — I872 Venous insufficiency (chronic) (peripheral): Secondary | ICD-10-CM

## 2022-02-22 DIAGNOSIS — K219 Gastro-esophageal reflux disease without esophagitis: Secondary | ICD-10-CM | POA: Diagnosis not present

## 2022-02-22 NOTE — Assessment & Plan Note (Signed)
Stable at the present time. 

## 2022-02-22 NOTE — Assessment & Plan Note (Signed)
Patient was advised to walk on a daily basis, she does not smoke does not drink.

## 2022-03-19 ENCOUNTER — Other Ambulatory Visit: Payer: Self-pay | Admitting: Internal Medicine

## 2022-03-30 ENCOUNTER — Other Ambulatory Visit: Payer: Self-pay | Admitting: Internal Medicine

## 2022-03-31 DIAGNOSIS — I739 Peripheral vascular disease, unspecified: Secondary | ICD-10-CM | POA: Diagnosis not present

## 2022-03-31 DIAGNOSIS — E663 Overweight: Secondary | ICD-10-CM | POA: Diagnosis not present

## 2022-03-31 DIAGNOSIS — G8929 Other chronic pain: Secondary | ICD-10-CM | POA: Diagnosis not present

## 2022-03-31 DIAGNOSIS — Z9849 Cataract extraction status, unspecified eye: Secondary | ICD-10-CM | POA: Diagnosis not present

## 2022-03-31 DIAGNOSIS — N3281 Overactive bladder: Secondary | ICD-10-CM | POA: Diagnosis not present

## 2022-03-31 DIAGNOSIS — I10 Essential (primary) hypertension: Secondary | ICD-10-CM | POA: Diagnosis not present

## 2022-03-31 DIAGNOSIS — Z7982 Long term (current) use of aspirin: Secondary | ICD-10-CM | POA: Diagnosis not present

## 2022-03-31 DIAGNOSIS — J45909 Unspecified asthma, uncomplicated: Secondary | ICD-10-CM | POA: Diagnosis not present

## 2022-03-31 DIAGNOSIS — J309 Allergic rhinitis, unspecified: Secondary | ICD-10-CM | POA: Diagnosis not present

## 2022-03-31 DIAGNOSIS — E785 Hyperlipidemia, unspecified: Secondary | ICD-10-CM | POA: Diagnosis not present

## 2022-04-19 ENCOUNTER — Other Ambulatory Visit: Payer: Self-pay | Admitting: Internal Medicine

## 2022-04-21 ENCOUNTER — Encounter: Payer: Self-pay | Admitting: Internal Medicine

## 2022-04-21 ENCOUNTER — Ambulatory Visit (INDEPENDENT_AMBULATORY_CARE_PROVIDER_SITE_OTHER): Payer: PPO | Admitting: Internal Medicine

## 2022-04-21 VITALS — BP 134/90 | HR 88 | Ht 62.0 in | Wt 166.7 lb

## 2022-04-21 DIAGNOSIS — R0602 Shortness of breath: Secondary | ICD-10-CM

## 2022-04-21 DIAGNOSIS — R059 Cough, unspecified: Secondary | ICD-10-CM

## 2022-04-21 DIAGNOSIS — I10 Essential (primary) hypertension: Secondary | ICD-10-CM | POA: Diagnosis not present

## 2022-04-21 DIAGNOSIS — E782 Mixed hyperlipidemia: Secondary | ICD-10-CM

## 2022-04-21 LAB — POC COVID19 BINAXNOW: SARS Coronavirus 2 Ag: NEGATIVE

## 2022-04-21 MED ORDER — AZITHROMYCIN 250 MG PO TABS: ORAL_TABLET | ORAL | 0 refills | Status: AC

## 2022-04-21 NOTE — Assessment & Plan Note (Signed)

## 2022-04-21 NOTE — Assessment & Plan Note (Signed)
Low cholesterol diet

## 2022-04-21 NOTE — Progress Notes (Signed)
Established Patient Office Visit  Subjective:  Patient ID: Amanda Cruz, female    DOB: 18-Apr-1946  Age: 76 y.o. MRN: [id number]  CC:  Chief Complaint  Patient presents with   Cough    Patient has had cough, congestion with wheezing for a couple of weeks. Patient denies any other symptoms or fever.     Cough Associated symptoms include wheezing. Pertinent negatives include no chest pain.    Amanda Cruz presents for patient complaining of chest cold congestion shortness of breath negative for COVID.  She says she had a COVID test +02 weeks ago.  She also has a right eye. I ordered a chest x-ray, will also give her a Z-Pak,   She was advised to see an eye doctor.  In the meantime she can use artificial tears.  Past Medical History:  Diagnosis Date   COVID-19 2021   GERD (gastroesophageal reflux disease)    Hypertension    Wears dentures    full upper and lower    Past Surgical History:  Procedure Laterality Date   ANTERIOR AND POSTERIOR REPAIR N/A 05/11/2021   Procedure: ANTERIOR (CYSTOCELE) AND POSTERIOR REPAIR (RECTOCELE);  Surgeon: Harlin Heys, MD;  Location: [facility];  Service: Gynecology;  Laterality: N/A;   CATARACT EXTRACTION W/PHACO Left 08/22/2019   Procedure: CATARACT EXTRACTION PHACO AND INTRAOCULAR LENS PLACEMENT (IOC) LEFT  9.98  01:10.4  14.2%;  Surgeon: Leandrew Koyanagi, MD;  Location: [place];  Service: Ophthalmology;  Laterality: Left;   CATARACT EXTRACTION W/PHACO Right 09/19/2019   Procedure: CATARACT EXTRACTION PHACO AND INTRAOCULAR LENS PLACEMENT (IOC) RIGHT PANOPTIX LENS 7.44  00:52.4  14.3%;  Surgeon: Leandrew Koyanagi, MD;  Location: [place];  Service: Ophthalmology;  Laterality: Right;   COLONOSCOPY WITH PROPOFOL N/A 05/09/2018   Procedure: COLONOSCOPY WITH PROPOFOL;  Surgeon: [name], MD;  Location: [facility];  Service: Endoscopy;  Laterality: N/A;   ESOPHAGOGASTRODUODENOSCOPY (EGD) WITH PROPOFOL  N/A 02/05/2020   Procedure: ESOPHAGOGASTRODUODENOSCOPY (EGD) WITH PROPOFOL;  Surgeon: [name], MD;  Location: [facility];  Service: Endoscopy;  Laterality: N/A;   EYE SURGERY     FOOT SURGERY Bilateral    KNEE ARTHROSCOPY Right    PUBOVAGINAL SLING N/A 05/11/2021   Procedure: PUBO-VAGINAL SLING (TOT);  Surgeon: Harlin Heys, MD;  Location: [facility];  Service: Gynecology;  Laterality: N/A;   TEAR DUCT PROBING  03/10/2015   TUBAL LIGATION     VAGINAL HYSTERECTOMY N/A 05/11/2021   Procedure: HYSTERECTOMY VAGINAL;  Surgeon: Harlin Heys, MD;  Location: [facility];  Service: Gynecology;  Laterality: N/A;    Family History  Problem Relation Age of Onset   Stroke Mother    Heart failure Mother    Thyroid disease Paternal Grandfather     Social History   Socioeconomic History   Marital status: Married    Spouse name: Not on file   Number of children: Not on file   Years of education: Not on file   Highest education level: Not on file  Occupational History   Not on file  Tobacco Use   Smoking status: Never   Smokeless tobacco: Never  Vaping Use   Vaping Use: Never used  Substance and Sexual Activity   Alcohol use: Not Currently    Alcohol/week: 1.0 standard drink of alcohol    Types: 1 Glasses of wine per week   Drug use: No   Sexual activity: Not Currently    Birth control/protection: Post-menopausal  Other  Topics Concern   Not on file  Social History Narrative   Not on file   Social Determinants of Health   Financial Resource Strain: Medium Risk (05/28/2021)   Overall Financial Resource Strain (CARDIA)    Difficulty of Paying Living Expenses: Somewhat hard  Food Insecurity: No Food Insecurity (05/28/2021)   Hunger Vital Sign    Worried About Running Out of Food in the Last Year: Never true    Ran Out of Food in the Last Year: Never true  Transportation Needs: No Transportation Needs (05/28/2021)   PRAPARE - Hydrologist  (Medical): No    Lack of Transportation (Non-Medical): No  Physical Activity: Insufficiently Active (05/28/2021)   Exercise Vital Sign    Days of Exercise per Week: 2 days    Minutes of Exercise per Session: 10 min  Stress: No Stress Concern Present (05/28/2021)   [place]    Feeling of Stress : Only a little  Social Connections: Socially Integrated (05/28/2021)   Social Connection and Isolation Panel [NHANES]    Frequency of Communication with Friends and Family: More than three times a week    Frequency of Social Gatherings with Friends and Family: More than three times a week    Attends Religious Services: More than 4 times per year    Active Member of [employer] or Organizations: Yes    Attends Music therapist: More than 4 times per year    Marital Status: Married  Human resources officer Violence: Not At Risk (05/28/2021)   Humiliation, Afraid, Rape, and Kick questionnaire    Fear of Current or Ex-Partner: No    Emotionally Abused: No    Physically Abused: No    Sexually Abused: No     Current Outpatient Medications:    albuterol (VENTOLIN HFA) 108 (90 Base) MCG/ACT inhaler, Inhale 2 puffs into the lungs every 6 (six) hours as needed for wheezing or shortness of breath., Disp: 8 g, Rfl: 0   atorvastatin (LIPITOR) 40 MG tablet, Take 1 tablet by mouth once daily, Disp: 90 tablet, Rfl: 0   azithromycin (ZITHROMAX) 250 MG tablet, Take 2 tablets on day 1, then 1 tablet daily on days 2 through 5, Disp: 6 tablet, Rfl: 0   Biotin w/ Vitamins C & E (HAIR/SKIN/NAILS PO), Take 1 tablet by mouth daily., Disp: , Rfl:    Cholecalciferol (VITAMIN D3) 25 MCG (1000 UT) CAPS, Take 1,000 Units by mouth daily., Disp: , Rfl:    Glucosamine 500 MG CAPS, Take by mouth., Disp: , Rfl:    IRON-VITAMIN C PO, Take 1 tablet by mouth daily., Disp: , Rfl:    metoprolol succinate (TOPROL-XL) 50 MG 24 hr tablet, Take 50 mg by mouth daily., Disp:  , Rfl:    metoprolol tartrate (LOPRESSOR) 50 MG tablet, Take 1 tablet by mouth once daily, Disp: 90 tablet, Rfl: 0   Propylene Glycol (SYSTANE BALANCE) 0.6 % SOLN, Place 1 drop into both eyes daily as needed (dry eyes)., Disp: , Rfl:    telmisartan-hydrochlorothiazide (MICARDIS HCT) 80-12.5 MG tablet, Take 1 tablet by mouth once daily, Disp: 30 tablet, Rfl: 0   No Known Allergies  ROS Review of Systems  Constitutional: Negative.   HENT: Negative.    Eyes: Negative.   Respiratory:  Positive for cough and wheezing.   Cardiovascular: Negative.  Negative for chest pain, palpitations and leg swelling.  Gastrointestinal: Negative.   Endocrine: Negative.  Genitourinary: Negative.   Musculoskeletal: Negative.  Negative for back pain.  Skin: Negative.   Allergic/Immunologic: Negative.   Neurological: Negative.  Negative for seizures and syncope.  Hematological: Negative.   Psychiatric/Behavioral: Negative.    All other systems reviewed and are negative.     Objective:    Physical Exam Eyes:     Conjunctiva/sclera:     Right eye: Hemorrhage present.   Cardiovascular:     Rate and Rhythm: Normal rate.     Heart sounds: Heart sounds are distant.  Pulmonary:     Breath sounds: Examination of the right-upper field reveals rhonchi. Examination of the left-upper field reveals rhonchi. Rhonchi present.     BP (!) 134/90   Pulse 88   Ht '5\' 2"'  (1.575 m)   Wt 166 lb 11.2 oz (75.6 kg)   BMI 30.49 kg/m  Wt Readings from Last 3 Encounters:  04/21/22 166 lb 11.2 oz (75.6 kg)  02/22/22 162 lb 9.6 oz (73.8 kg)  02/16/22 161 lb 12.8 oz (73.4 kg)     Health Maintenance Due  Topic Date Due   Zoster Vaccines- Shingrix (1 of 2) Never done   TETANUS/TDAP  08/21/2021   COVID-19 Vaccine (5 - Pfizer series) 08/31/2021   INFLUENZA VACCINE  03/30/2022    There are no preventive care reminders to display for this patient.  Lab Results  Component Value Date   TSH 2.24 02/16/2022   Lab  Results  Component Value Date   WBC 4.1 02/16/2022   HGB 13.5 02/16/2022   HCT 39.5 02/16/2022   MCV 89.8 02/16/2022   PLT 191 02/16/2022   Lab Results  Component Value Date   NA 142 02/16/2022   K 4.0 02/16/2022   CO2 26 02/16/2022   GLUCOSE 97 02/16/2022   BUN 17 02/16/2022   CREATININE 0.88 02/16/2022   BILITOT 0.7 02/16/2022   AST 21 02/16/2022   ALT 16 02/16/2022   PROT 6.7 02/16/2022   CALCIUM 9.0 02/16/2022   ANIONGAP 8 05/11/2021   EGFR 68 02/16/2022   Lab Results  Component Value Date   CHOL 108 02/16/2022   Lab Results  Component Value Date   HDL 41 (L) 02/16/2022   Lab Results  Component Value Date   LDLCALC 46 02/16/2022   Lab Results  Component Value Date   TRIG 128 02/16/2022   Lab Results  Component Value Date   CHOLHDL 2.6 02/16/2022   No results found for: "HGBA1C"    Assessment & Plan:   Problem List Items Addressed This Visit       Cardiovascular and Mediastinum   Essential hypertension     Patient denies any chest pain or shortness of breath there is no history of palpitation or paroxysmal nocturnal dyspnea   patient was advised to follow low-salt low-cholesterol diet    ideally I want to keep systolic blood pressure below 130 mmHg, patient was asked to check blood pressure one times a week and give me a report on that.  Patient will be follow-up in 3 months  or earlier as needed, patient will call me back for any change in the cardiovascular symptoms Patient was advised to buy a book from local bookstore concerning blood pressure and read several chapters  every day.  This will be supplemented by some of the material we will give him from the office.  Patient should also utilize other resources like YouTube and Internet to learn more about the blood pressure and the diet.  Other   Hyperlipidemia    Low-cholesterol diet      Shortness of breath    Due to upper respiratory infection we will give Z-Pak      Cough - Primary     Patient was positive for COVID 2 weeks ago.  Today she is negative for COVID but chest has rhonchi so we will do a chest x-ray and will give her a Z-Pak.      Relevant Orders   POC COVID-19 (Completed)   DG Chest 2 View    Meds ordered this encounter  Medications   azithromycin (ZITHROMAX) 250 MG tablet    Sig: Take 2 tablets on day 1, then 1 tablet daily on days 2 through 5    Dispense:  6 tablet    Refill:  0    Follow-up: No follow-ups on file.    [name], MD

## 2022-04-21 NOTE — Assessment & Plan Note (Signed)
Due to upper respiratory infection we will give Z-Pak

## 2022-04-21 NOTE — Assessment & Plan Note (Signed)
Patient was positive for COVID 2 weeks ago.  Today she is negative for COVID but chest has rhonchi so we will do a chest x-ray and will give her a Z-Pak.

## 2022-05-04 ENCOUNTER — Ambulatory Visit (INDEPENDENT_AMBULATORY_CARE_PROVIDER_SITE_OTHER): Payer: PPO | Admitting: Internal Medicine

## 2022-05-04 ENCOUNTER — Encounter: Payer: Self-pay | Admitting: Internal Medicine

## 2022-05-04 VITALS — BP 138/75 | HR 86 | Ht 62.0 in | Wt 156.9 lb

## 2022-05-04 DIAGNOSIS — I872 Venous insufficiency (chronic) (peripheral): Secondary | ICD-10-CM

## 2022-05-04 DIAGNOSIS — I10 Essential (primary) hypertension: Secondary | ICD-10-CM | POA: Diagnosis not present

## 2022-05-04 DIAGNOSIS — I83893 Varicose veins of bilateral lower extremities with other complications: Secondary | ICD-10-CM | POA: Diagnosis not present

## 2022-05-04 DIAGNOSIS — R0602 Shortness of breath: Secondary | ICD-10-CM

## 2022-05-04 DIAGNOSIS — J4520 Mild intermittent asthma, uncomplicated: Secondary | ICD-10-CM | POA: Diagnosis not present

## 2022-05-04 DIAGNOSIS — M5432 Sciatica, left side: Secondary | ICD-10-CM | POA: Diagnosis not present

## 2022-05-04 DIAGNOSIS — E7849 Other hyperlipidemia: Secondary | ICD-10-CM

## 2022-05-04 MED ORDER — FLUTICASONE-SALMETEROL 100-50 MCG/ACT IN AEPB: 1.0000 | INHALATION_SPRAY | Freq: Two times a day (BID) | RESPIRATORY_TRACT | 3 refills | Status: AC

## 2022-05-04 NOTE — Assessment & Plan Note (Signed)
Stable

## 2022-05-04 NOTE — Progress Notes (Signed)
Established Patient Office Visit  Subjective:  Patient ID: Amanda Cruz, female    DOB: September 03, 1945  Age: 76 y.o. MRN: [id number]  CC:  Chief Complaint  Patient presents with   Follow-up    Patient here for 2 week follow up. Patient still has lingering cough. Patient states its mostly a dry hacking cough.     HPI  [name] presents for cough and sob  Past Medical History:  Diagnosis Date   COVID-19 2021   GERD (gastroesophageal reflux disease)    Hypertension    Wears dentures    full upper and lower    Past Surgical History:  Procedure Laterality Date   ANTERIOR AND POSTERIOR REPAIR N/A 05/11/2021   Procedure: ANTERIOR (CYSTOCELE) AND POSTERIOR REPAIR (RECTOCELE);  Surgeon: Harlin Heys, MD;  Location: [facility];  Service: Gynecology;  Laterality: N/A;   CATARACT EXTRACTION W/PHACO Left 08/22/2019   Procedure: CATARACT EXTRACTION PHACO AND INTRAOCULAR LENS PLACEMENT (IOC) LEFT  9.98  01:10.4  14.2%;  Surgeon: Leandrew Koyanagi, MD;  Location: [facility];  Service: Ophthalmology;  Laterality: Left;   CATARACT EXTRACTION W/PHACO Right 09/19/2019   Procedure: CATARACT EXTRACTION PHACO AND INTRAOCULAR LENS PLACEMENT (IOC) RIGHT PANOPTIX LENS 7.44  00:52.4  14.3%;  Surgeon: Leandrew Koyanagi, MD;  Location: [facility];  Service: Ophthalmology;  Laterality: Right;   COLONOSCOPY WITH PROPOFOL N/A 05/09/2018   Procedure: COLONOSCOPY WITH PROPOFOL;  Surgeon: [name], MD;  Location: [facility];  Service: Endoscopy;  Laterality: N/A;   ESOPHAGOGASTRODUODENOSCOPY (EGD) WITH PROPOFOL N/A 02/05/2020   Procedure: ESOPHAGOGASTRODUODENOSCOPY (EGD) WITH PROPOFOL;  Surgeon: [name], MD;  Location: [facility] END[facility];  Service: Endoscopy;  Laterality: N/A;   EYE SURGERY     FOOT SURGERY Bilateral    KNEE ARTHROSCOPY Right    PUBOVAGINAL SLING N/A 05/11/2021   Procedure: PUBO-VAGINAL SLING (TOT);  Surgeon: Harlin Heys, MD;  Location: [facility]  [facility];  Service: Gynecology;  Laterality: N/A;   TEAR DUCT PROBING  03/10/2015   TUBAL LIGATION     VAGINAL HYSTERECTOMY N/A 05/11/2021   Procedure: HYSTERECTOMY VAGINAL;  Surgeon: Harlin Heys, MD;  Location: [facility];  Service: Gynecology;  Laterality: N/A;    Family History  Problem Relation Age of Onset   Stroke Mother    Heart failure Mother    Thyroid disease Paternal Grandfather     Social History   Socioeconomic History   Marital status: Married    Spouse name: Not on file   Number of children: Not on file   Years of education: Not on file   Highest education level: Not on file  Occupational History   Not on file  Tobacco Use   Smoking status: Never   Smokeless tobacco: Never  Vaping Use   Vaping Use: Never used  Substance and Sexual Activity   Alcohol use: Not Currently    Alcohol/week: 1.0 standard drink of alcohol    Types: 1 Glasses of wine per week   Drug use: No   Sexual activity: Not Currently    Birth control/protection: Post-menopausal  Other Topics Concern   Not on file  Social History Narrative   Not on file   Social Determinants of Health   Financial Resource Strain: Medium Risk (05/28/2021)   Overall Financial Resource Strain (CARDIA)    Difficulty of Paying Living Expenses: Somewhat hard  Food Insecurity: No Food Insecurity (05/28/2021)   Hunger Vital Sign    Worried About Running Out of Food  in the Last Year: Never true    [place] in the Last Year: Never true  Transportation Needs: No Transportation Needs (05/28/2021)   PRAPARE - Hydrologist (Medical): No    Lack of Transportation (Non-Medical): No  Physical Activity: Insufficiently Active (05/28/2021)   Exercise Vital Sign    Days of Exercise per Week: 2 days    Minutes of Exercise per Session: 10 min  Stress: No Stress Concern Present (05/28/2021)   [place]    Feeling of Stress :  Only a little  Social Connections: Socially Integrated (05/28/2021)   Social Connection and Isolation Panel [NHANES]    Frequency of Communication with Friends and Family: More than three times a week    Frequency of Social Gatherings with Friends and Family: More than three times a week    Attends Religious Services: More than 4 times per year    Active Member of [employer] or Organizations: Yes    Attends Music therapist: More than 4 times per year    Marital Status: Married  Human resources officer Violence: Not At Risk (05/28/2021)   Humiliation, Afraid, Rape, and Kick questionnaire    Fear of Current or Ex-Partner: No    Emotionally Abused: No    Physically Abused: No    Sexually Abused: No     Current Outpatient Medications:    albuterol (VENTOLIN HFA) 108 (90 Base) MCG/ACT inhaler, Inhale 2 puffs into the lungs every 6 (six) hours as needed for wheezing or shortness of breath., Disp: 8 g, Rfl: 0   atorvastatin (LIPITOR) 40 MG tablet, Take 1 tablet by mouth once daily, Disp: 90 tablet, Rfl: 0   Biotin w/ Vitamins C & E (HAIR/SKIN/NAILS PO), Take 1 tablet by mouth daily., Disp: , Rfl:    Cholecalciferol (VITAMIN D3) 25 MCG (1000 UT) CAPS, Take 1,000 Units by mouth daily., Disp: , Rfl:    fluticasone-salmeterol (ADVAIR) 100-50 MCG/ACT AEPB, Inhale 1 puff into the lungs 2 (two) times daily., Disp: 1 each, Rfl: 3   Glucosamine 500 MG CAPS, Take by mouth., Disp: , Rfl:    IRON-VITAMIN C PO, Take 1 tablet by mouth daily., Disp: , Rfl:    metoprolol succinate (TOPROL-XL) 50 MG 24 hr tablet, Take 50 mg by mouth daily., Disp: , Rfl:    Propylene Glycol (SYSTANE BALANCE) 0.6 % SOLN, Place 1 drop into both eyes daily as needed (dry eyes)., Disp: , Rfl:    telmisartan-hydrochlorothiazide (MICARDIS HCT) 80-12.5 MG tablet, Take 1 tablet by mouth once daily, Disp: 30 tablet, Rfl: 0   No Known Allergies  ROS Review of Systems  Constitutional: Negative.   HENT: Negative.    Eyes: Negative.    Respiratory: Negative.    Cardiovascular: Negative.   Gastrointestinal: Negative.   Endocrine: Negative.   Genitourinary: Negative.   Musculoskeletal: Negative.   Skin: Negative.   Allergic/Immunologic: Negative.   Neurological: Negative.   Hematological: Negative.   Psychiatric/Behavioral: Negative.    All other systems reviewed and are negative.     Objective:    Physical Exam Vitals reviewed.  Constitutional:      Appearance: Normal appearance.  HENT:     Mouth/Throat:     Mouth: Mucous membranes are moist.  Eyes:     Pupils: Pupils are equal, round, and reactive to light.  Neck:     Vascular: No carotid bruit.  Cardiovascular:  Rate and Rhythm: Normal rate and regular rhythm.     Pulses: Normal pulses.     Heart sounds: Normal heart sounds.  Pulmonary:     Effort: Pulmonary effort is normal.     Breath sounds: Normal breath sounds.  Abdominal:     General: Bowel sounds are normal.     Palpations: Abdomen is soft. There is no hepatomegaly, splenomegaly or mass.     Tenderness: There is no abdominal tenderness.     Hernia: No hernia is present.  Musculoskeletal:        General: No tenderness.     Cervical back: Neck supple.     Right lower leg: No edema.     Left lower leg: No edema.  Skin:    Findings: No rash.  Neurological:     Mental Status: She is alert and oriented to person, place, and time.     Motor: No weakness.  Psychiatric:        Mood and Affect: Mood and affect normal.        Behavior: Behavior normal.     BP 138/75   Pulse 86   Ht '5\' 2"'  (1.575 m)   Wt 156 lb 14.4 oz (71.2 kg)   BMI 28.70 kg/m  Wt Readings from Last 3 Encounters:  05/04/22 156 lb 14.4 oz (71.2 kg)  04/21/22 166 lb 11.2 oz (75.6 kg)  02/22/22 162 lb 9.6 oz (73.8 kg)     Health Maintenance Due  Topic Date Due   Zoster Vaccines- Shingrix (1 of 2) Never done   TETANUS/TDAP  08/21/2021   COVID-19 Vaccine (5 - Pfizer series) 08/31/2021   INFLUENZA VACCINE   03/30/2022    There are no preventive care reminders to display for this patient.  Lab Results  Component Value Date   TSH 2.24 02/16/2022   Lab Results  Component Value Date   WBC 4.1 02/16/2022   HGB 13.5 02/16/2022   HCT 39.5 02/16/2022   MCV 89.8 02/16/2022   PLT 191 02/16/2022   Lab Results  Component Value Date   NA 142 02/16/2022   K 4.0 02/16/2022   CO2 26 02/16/2022   GLUCOSE 97 02/16/2022   BUN 17 02/16/2022   CREATININE 0.88 02/16/2022   BILITOT 0.7 02/16/2022   AST 21 02/16/2022   ALT 16 02/16/2022   PROT 6.7 02/16/2022   CALCIUM 9.0 02/16/2022   ANIONGAP 8 05/11/2021   EGFR 68 02/16/2022   Lab Results  Component Value Date   CHOL 108 02/16/2022   Lab Results  Component Value Date   HDL 41 (L) 02/16/2022   Lab Results  Component Value Date   LDLCALC 46 02/16/2022   Lab Results  Component Value Date   TRIG 128 02/16/2022   Lab Results  Component Value Date   CHOLHDL 2.6 02/16/2022   No results found for: "HGBA1C"    Assessment & Plan:   Problem List Items Addressed This Visit       Cardiovascular and Mediastinum   Essential hypertension     Patient denies any chest pain or shortness of breath there is no history of palpitation or paroxysmal nocturnal dyspnea   patient was advised to follow low-salt low-cholesterol diet    ideally I want to keep systolic blood pressure below 130 mmHg, patient was asked to check blood pressure one times a week and give me a report on that.  Patient will be follow-up in 3 months  or earlier as needed,  patient will call me back for any change in the cardiovascular symptoms Patient was advised to buy a book from local bookstore concerning blood pressure and read several chapters  every day.  This will be supplemented by some of the material we will give him from the office.  Patient should also utilize other resources like YouTube and Internet to learn more about the blood pressure and the diet.      Chronic  venous insufficiency    Stable at the present time      Varicose veins of bilateral lower extremities with other complications    Stable        Nervous and Auditory   Sciatica of left side    Under control        Other   Hyperlipidemia    Hypercholesterolemia  I advised the patient to follow Mediterranean diet This diet is rich in fruits vegetables and whole grain, and This diet is also rich in fish and lean meat Patient should also eat a handful of almonds or walnuts daily Recent heart study indicated that average follow-up on this kind of diet reduces the cardiovascular mortality by 50 to 70%==      Shortness of breath    Started on Advair 1 puff twice a day      Other Visit Diagnoses     Mild intermittent asthma without complication    -  Primary   Relevant Medications   fluticasone-salmeterol (ADVAIR) 100-50 MCG/ACT AEPB       Meds ordered this encounter  Medications   fluticasone-salmeterol (ADVAIR) 100-50 MCG/ACT AEPB    Sig: Inhale 1 puff into the lungs 2 (two) times daily.    Dispense:  1 each    Refill:  3    Follow-up: No follow-ups on file.    [name], MD

## 2022-05-04 NOTE — Assessment & Plan Note (Signed)
Hypercholesterolemia  I advised the patient to follow Mediterranean diet This diet is rich in fruits vegetables and whole grain, and This diet is also rich in fish and lean meat Patient should also eat a handful of almonds or walnuts daily Recent heart study indicated that average follow-up on this kind of diet reduces the cardiovascular mortality by 50 to 70%== 

## 2022-05-04 NOTE — Assessment & Plan Note (Signed)
Stable at the present time. 

## 2022-05-04 NOTE — Assessment & Plan Note (Signed)
Under control 

## 2022-05-04 NOTE — Assessment & Plan Note (Signed)
Started on Advair 1 puff twice a day

## 2022-05-04 NOTE — Assessment & Plan Note (Signed)

## 2022-05-23 ENCOUNTER — Other Ambulatory Visit: Payer: Self-pay | Admitting: Internal Medicine

## 2022-05-24 ENCOUNTER — Ambulatory Visit: Payer: PPO | Admitting: Internal Medicine

## 2022-05-26 ENCOUNTER — Encounter: Payer: PPO | Admitting: Obstetrics and Gynecology

## 2022-05-28 ENCOUNTER — Other Ambulatory Visit: Payer: Self-pay | Admitting: Internal Medicine

## 2022-05-31 ENCOUNTER — Encounter: Payer: PPO | Admitting: Obstetrics and Gynecology

## 2022-06-01 ENCOUNTER — Encounter: Payer: Self-pay | Admitting: Internal Medicine

## 2022-06-01 ENCOUNTER — Ambulatory Visit (INDEPENDENT_AMBULATORY_CARE_PROVIDER_SITE_OTHER): Payer: PPO | Admitting: Internal Medicine

## 2022-06-01 VITALS — BP 134/72 | HR 68 | Ht 62.0 in | Wt 159.3 lb

## 2022-06-01 DIAGNOSIS — I10 Essential (primary) hypertension: Secondary | ICD-10-CM

## 2022-06-01 DIAGNOSIS — K219 Gastro-esophageal reflux disease without esophagitis: Secondary | ICD-10-CM | POA: Diagnosis not present

## 2022-06-01 DIAGNOSIS — Z23 Encounter for immunization: Secondary | ICD-10-CM | POA: Diagnosis not present

## 2022-06-01 DIAGNOSIS — I872 Venous insufficiency (chronic) (peripheral): Secondary | ICD-10-CM | POA: Diagnosis not present

## 2022-06-01 DIAGNOSIS — M5432 Sciatica, left side: Secondary | ICD-10-CM | POA: Diagnosis not present

## 2022-06-01 DIAGNOSIS — I83893 Varicose veins of bilateral lower extremities with other complications: Secondary | ICD-10-CM | POA: Diagnosis not present

## 2022-06-01 NOTE — Progress Notes (Signed)
Established Patient Office Visit  Subjective:  Patient ID: Amanda Cruz, female    DOB: 25-Sep-1945  Age: 76 y.o. MRN: [id number]  CC:  Chief Complaint  Patient presents with   Follow-up    HPI  Amanda Cruz presents for check up  Past Medical History:  Diagnosis Date   COVID-19 2021   GERD (gastroesophageal reflux disease)    Hypertension    Wears dentures    full upper and lower    Past Surgical History:  Procedure Laterality Date   ANTERIOR AND POSTERIOR REPAIR N/A 05/11/2021   Procedure: ANTERIOR (CYSTOCELE) AND POSTERIOR REPAIR (RECTOCELE);  Surgeon: Harlin Heys, MD;  Location: [facility];  Service: Gynecology;  Laterality: N/A;   CATARACT EXTRACTION W/PHACO Left 08/22/2019   Procedure: CATARACT EXTRACTION PHACO AND INTRAOCULAR LENS PLACEMENT (IOC) LEFT  9.98  01:10.4  14.2%;  Surgeon: Leandrew Koyanagi, MD;  Location: [facility];  Service: Ophthalmology;  Laterality: Left;   CATARACT EXTRACTION W/PHACO Right 09/19/2019   Procedure: CATARACT EXTRACTION PHACO AND INTRAOCULAR LENS PLACEMENT (IOC) RIGHT PANOPTIX LENS 7.44  00:52.4  14.3%;  Surgeon: Leandrew Koyanagi, MD;  Location: [place] [facility];  Service: Ophthalmology;  Laterality: Right;   COLONOSCOPY WITH PROPOFOL N/A 05/09/2018   Procedure: COLONOSCOPY WITH PROPOFOL;  Surgeon: [name], MD;  Location: [facility];  Service: Endoscopy;  Laterality: N/A;   ESOPHAGOGASTRODUODENOSCOPY (EGD) WITH PROPOFOL N/A 02/05/2020   Procedure: ESOPHAGOGASTRODUODENOSCOPY (EGD) WITH PROPOFOL;  Surgeon: [name], MD;  Location: [facility];  Service: Endoscopy;  Laterality: N/A;   EYE SURGERY     FOOT SURGERY Bilateral    KNEE ARTHROSCOPY Right    PUBOVAGINAL SLING N/A 05/11/2021   Procedure: PUBO-VAGINAL SLING (TOT);  Surgeon: Harlin Heys, MD;  Location: [facility];  Service: Gynecology;  Laterality: N/A;   TEAR DUCT PROBING  03/10/2015   TUBAL LIGATION     VAGINAL HYSTERECTOMY N/A  05/11/2021   Procedure: HYSTERECTOMY VAGINAL;  Surgeon: Harlin Heys, MD;  Location: [facility];  Service: Gynecology;  Laterality: N/A;    Family History  Problem Relation Age of Onset   Stroke Mother    Heart failure Mother    Thyroid disease Paternal Grandfather     Social History   Socioeconomic History   Marital status: Married    Spouse name: Not on file   Number of children: Not on file   Years of education: Not on file   Highest education level: Not on file  Occupational History   Not on file  Tobacco Use   Smoking status: Never   Smokeless tobacco: Never  Vaping Use   Vaping Use: Never used  Substance and Sexual Activity   Alcohol use: Not Currently    Alcohol/week: 1.0 standard drink of alcohol    Types: 1 Glasses of wine per week   Drug use: No   Sexual activity: Not Currently    Birth control/protection: Post-menopausal  Other Topics Concern   Not on file  Social History Narrative   Not on file   Social Determinants of Health   Financial Resource Strain: Medium Risk (05/28/2021)   Overall Financial Resource Strain (CARDIA)    Difficulty of Paying Living Expenses: Somewhat hard  Food Insecurity: No Food Insecurity (05/28/2021)   Hunger Vital Sign    Worried About Running Out of Food in the Last Year: Never true    Ran Out of Food in the Last Year: Never true  Transportation Needs: No Transportation Needs (  05/28/2021)   PRAPARE - Hydrologist (Medical): No    Lack of Transportation (Non-Medical): No  Physical Activity: Insufficiently Active (05/28/2021)   Exercise Vital Sign    Days of Exercise per Week: 2 days    Minutes of Exercise per Session: 10 min  Stress: No Stress Concern Present (05/28/2021)   [place]    Feeling of Stress : Only a little  Social Connections: Socially Integrated (05/28/2021)   Social Connection and Isolation Panel [NHANES]     Frequency of Communication with Friends and Family: More than three times a week    Frequency of Social Gatherings with Friends and Family: More than three times a week    Attends Religious Services: More than 4 times per year    Active Member of [employer] or Organizations: Yes    Attends Music therapist: More than 4 times per year    Marital Status: Married  Human resources officer Violence: Not At Risk (05/28/2021)   Humiliation, Afraid, Rape, and Kick questionnaire    Fear of Current or Ex-Partner: No    Emotionally Abused: No    Physically Abused: No    Sexually Abused: No     Current Outpatient Medications:    albuterol (VENTOLIN HFA) 108 (90 Base) MCG/ACT inhaler, Inhale 2 puffs into the lungs every 6 (six) hours as needed for wheezing or shortness of breath., Disp: 8 g, Rfl: 0   atorvastatin (LIPITOR) 40 MG tablet, Take 1 tablet by mouth once daily, Disp: 90 tablet, Rfl: 0   Biotin w/ Vitamins C & E (HAIR/SKIN/NAILS PO), Take 1 tablet by mouth daily., Disp: , Rfl:    Cholecalciferol (VITAMIN D3) 25 MCG (1000 UT) CAPS, Take 1,000 Units by mouth daily., Disp: , Rfl:    fluticasone-salmeterol (ADVAIR) 100-50 MCG/ACT AEPB, Inhale 1 puff into the lungs 2 (two) times daily., Disp: 1 each, Rfl: 3   Glucosamine 500 MG CAPS, Take by mouth., Disp: , Rfl:    IRON-VITAMIN C PO, Take 1 tablet by mouth daily., Disp: , Rfl:    metoprolol succinate (TOPROL-XL) 50 MG 24 hr tablet, Take 50 mg by mouth daily., Disp: , Rfl:    Propylene Glycol (SYSTANE BALANCE) 0.6 % SOLN, Place 1 drop into both eyes daily as needed (dry eyes)., Disp: , Rfl:    telmisartan-hydrochlorothiazide (MICARDIS HCT) 80-12.5 MG tablet, Take 1 tablet by mouth once daily, Disp: 30 tablet, Rfl: 0   No Known Allergies  ROS Review of Systems  Constitutional: Negative.   HENT: Negative.    Eyes: Negative.   Respiratory: Negative.    Cardiovascular: Negative.   Gastrointestinal: Negative.   Endocrine: Negative.    Genitourinary: Negative.   Musculoskeletal: Negative.   Skin: Negative.   Allergic/Immunologic: Negative.   Neurological: Negative.   Hematological: Negative.   Psychiatric/Behavioral: Negative.    All other systems reviewed and are negative.     Objective:    Physical Exam Vitals reviewed.  Constitutional:      Appearance: Normal appearance.  HENT:     Mouth/Throat:     Mouth: Mucous membranes are moist.  Eyes:     Pupils: Pupils are equal, round, and reactive to light.  Neck:     Vascular: No carotid bruit.  Cardiovascular:     Rate and Rhythm: Normal rate and regular rhythm.     Pulses: Normal pulses.     Heart sounds: Normal heart sounds.  Pulmonary:     Effort: Pulmonary effort is normal.     Breath sounds: Normal breath sounds.  Abdominal:     General: Bowel sounds are normal.     Palpations: Abdomen is soft. There is no hepatomegaly, splenomegaly or mass.     Tenderness: There is no abdominal tenderness.     Hernia: No hernia is present.  Musculoskeletal:        General: No tenderness.     Cervical back: Neck supple.     Right lower leg: No edema.     Left lower leg: No edema.  Skin:    Findings: No rash.  Neurological:     Mental Status: She is alert and oriented to person, place, and time.     Motor: No weakness.  Psychiatric:        Mood and Affect: Mood and affect normal.        Behavior: Behavior normal.     BP 134/72   Pulse 68   Ht '5\' 2"'  (1.575 m)   Wt 159 lb 4.8 oz (72.3 kg)   BMI 29.14 kg/m  Wt Readings from Last 3 Encounters:  06/01/22 159 lb 4.8 oz (72.3 kg)  05/04/22 156 lb 14.4 oz (71.2 kg)  04/21/22 166 lb 11.2 oz (75.6 kg)     Health Maintenance Due  Topic Date Due   TETANUS/TDAP  08/21/2021   COVID-19 Vaccine (5 - Pfizer series) 08/31/2021    There are no preventive care reminders to display for this patient.  Lab Results  Component Value Date   TSH 2.24 02/16/2022   Lab Results  Component Value Date   WBC 4.1  02/16/2022   HGB 13.5 02/16/2022   HCT 39.5 02/16/2022   MCV 89.8 02/16/2022   PLT 191 02/16/2022   Lab Results  Component Value Date   NA 142 02/16/2022   K 4.0 02/16/2022   CO2 26 02/16/2022   GLUCOSE 97 02/16/2022   BUN 17 02/16/2022   CREATININE 0.88 02/16/2022   BILITOT 0.7 02/16/2022   AST 21 02/16/2022   ALT 16 02/16/2022   PROT 6.7 02/16/2022   CALCIUM 9.0 02/16/2022   ANIONGAP 8 05/11/2021   EGFR 68 02/16/2022   Lab Results  Component Value Date   CHOL 108 02/16/2022   Lab Results  Component Value Date   HDL 41 (L) 02/16/2022   Lab Results  Component Value Date   LDLCALC 46 02/16/2022   Lab Results  Component Value Date   TRIG 128 02/16/2022   Lab Results  Component Value Date   CHOLHDL 2.6 02/16/2022   No results found for: "HGBA1C"    Assessment & Plan:   Problem List Items Addressed This Visit       Cardiovascular and Mediastinum   Essential hypertension   Chronic venous insufficiency   Varicose veins of bilateral lower extremities with other complications     Digestive   Acid reflux     Nervous and Auditory   Sciatica of left side   Other Visit Diagnoses     Need for influenza vaccination    -  Primary   Relevant Orders   Flu Vaccine QUAD High Dose(Fluad) (Completed)       No orders of the defined types were placed in this encounter.   Follow-up: No follow-ups on file.    [name], MD

## 2022-06-08 ENCOUNTER — Encounter: Payer: Self-pay | Admitting: Obstetrics and Gynecology

## 2022-06-08 ENCOUNTER — Ambulatory Visit (INDEPENDENT_AMBULATORY_CARE_PROVIDER_SITE_OTHER): Payer: PPO | Admitting: Obstetrics and Gynecology

## 2022-06-08 VITALS — BP 129/69 | HR 86 | Ht 62.0 in | Wt 158.6 lb

## 2022-06-08 DIAGNOSIS — Z01419 Encounter for gynecological examination (general) (routine) without abnormal findings: Secondary | ICD-10-CM

## 2022-06-08 NOTE — Progress Notes (Signed)
HPI:      [name] is a 76 y.o. [id number] who LMP was No LMP recorded. Patient has had a hysterectomy.  Subjective:   She presents today for her annual examination.  She states she is not having any problems with incontinence and feels well.  She denies any issues with prolapse or symptoms. Underwent LAVH A&P repair with TOT 1 year ago.)    Hx: The following portions of the patient's history were reviewed and updated as appropriate:             She  has a past medical history of COVID-19 (2021), GERD (gastroesophageal reflux disease), Hypertension, and Wears dentures. She does not have any pertinent problems on file. She  has a past surgical history that includes Tear duct probing (03/10/2015); Tubal ligation; Colonoscopy with propofol (N/A, 05/09/2018); Cataract extraction w/PHACO (Left, 08/22/2019); Cataract extraction w/PHACO (Right, 09/19/2019); Eye surgery; Esophagogastroduodenoscopy (egd) with propofol (N/A, 02/05/2020); Knee arthroscopy (Right); Foot surgery (Bilateral); Vaginal hysterectomy (N/A, 05/11/2021); Anterior and posterior repair (N/A, 05/11/2021); and Pubovaginal sling (N/A, 05/11/2021). Her family history includes Heart failure in her mother; Stroke in her mother; Thyroid disease in her paternal grandfather. She  reports that she has never smoked. She has never used smokeless tobacco. She reports that she does not currently use alcohol after a past usage of about 1.0 standard drink of alcohol per week. She reports that she does not use drugs. She has a current medication list which includes the following prescription(s): albuterol, atorvastatin, biotin w/ vitamins c & e, vitamin d3, fluticasone-salmeterol, glucosamine, iron-vitamin c, metoprolol succinate, systane balance, and telmisartan-hydrochlorothiazide. She has No Known Allergies.       Review of Systems:  Review of Systems  Constitutional: Denied constitutional symptoms, night sweats, recent illness, fatigue,  fever, insomnia and weight loss.  Eyes: Denied eye symptoms, eye pain, photophobia, vision change and visual disturbance.  Ears/Nose/Throat/Neck: Denied ear, nose, throat or neck symptoms, hearing loss, nasal discharge, sinus congestion and sore throat.  Cardiovascular: Denied cardiovascular symptoms, arrhythmia, chest pain/pressure, edema, exercise intolerance, orthopnea and palpitations.  Respiratory: Denied pulmonary symptoms, asthma, pleuritic pain, productive sputum, cough, dyspnea and wheezing.  Gastrointestinal: Denied, gastro-esophageal reflux, melena, nausea and vomiting.  Genitourinary: Denied genitourinary symptoms including symptomatic vaginal discharge, pelvic relaxation issues, and urinary complaints.  Musculoskeletal: Denied musculoskeletal symptoms, stiffness, swelling, muscle weakness and myalgia.  Dermatologic: Denied dermatology symptoms, rash and scar.  Neurologic: Denied neurology symptoms, dizziness, headache, neck pain and syncope.  Psychiatric: Denied psychiatric symptoms, anxiety and depression.  Endocrine: Denied endocrine symptoms including hot flashes and night sweats.   Meds:   Current Outpatient Medications on File Prior to Visit  Medication Sig Dispense Refill   albuterol (VENTOLIN HFA) 108 (90 Base) MCG/ACT inhaler Inhale 2 puffs into the lungs every 6 (six) hours as needed for wheezing or shortness of breath. 8 g 0   atorvastatin (LIPITOR) 40 MG tablet Take 1 tablet by mouth once daily 90 tablet 0   Biotin w/ Vitamins C & E (HAIR/SKIN/NAILS PO) Take 1 tablet by mouth daily.     Cholecalciferol (VITAMIN D3) 25 MCG (1000 UT) CAPS Take 1,000 Units by mouth daily.     fluticasone-salmeterol (ADVAIR) 100-50 MCG/ACT AEPB Inhale 1 puff into the lungs 2 (two) times daily. 1 each 3   Glucosamine 500 MG CAPS Take by mouth.     IRON-VITAMIN C PO Take 1 tablet by mouth daily.     metoprolol succinate (TOPROL-XL) 50 MG 24 hr tablet  Take 50 mg by mouth daily.     Propylene  Glycol (SYSTANE BALANCE) 0.6 % SOLN Place 1 drop into both eyes daily as needed (dry eyes).     telmisartan-hydrochlorothiazide (MICARDIS HCT) 80-12.5 MG tablet Take 1 tablet by mouth once daily 30 tablet 0   No current facility-administered medications on file prior to visit.     Objective:     Vitals:   06/08/22 1350  BP: 129/69  Pulse: 86    Filed Weights   06/08/22 1350  Weight: 158 lb 9.6 oz (71.9 kg)    Physical examination   Pelvic:   Vulva: Normal appearance.  No lesions.  Vagina: No lesions or abnormalities noted.  Support: Second-degree cystocele however vagina is very small  Urethra No masses tenderness or scarring.  Meatus Normal size without lesions or prolapse.  Cervix: Surgically absent  Anus: Normal exam.  No lesions.  Perineum: Normal exam.  No lesions.        Bimanual   Uterus: The absent  Adnexae: No masses.  Non-tender to palpation.  Cul-de-sac: Negative for abnormality.     Assessment:    [id number] Patient Active Problem List   Diagnosis Date Noted   Complete uterovaginal prolapse 05/11/2021   Post-operative state 05/11/2021   Cough 12/05/2020   Shortness of breath 11/06/2020   Dizziness 07/01/2020   Sciatica of left side 06/13/2020   Claudication ([place]) 06/13/2020   COVID-19 virus infection 05/29/2020   Dermatitis 04/17/2020   Acute gastritis without hemorrhage    Varicose veins of bilateral lower extremities with other complications 60/05/9322   Chronic venous insufficiency 08/07/2018   Hyperlipidemia 08/07/2018   Annual physical exam    Polyp of sigmoid colon    Pessary maintenance 04/24/2015   Cystocele with uterine prolapse 04/24/2015   Essential hypertension 03/13/2015   Acid reflux 03/13/2015     1. Well woman exam with routine gynecological exam     Doing well.   Plan:            1.  Basic Screening Recommendations The basic screening recommendations for asymptomatic women were discussed with the patient during her  visit.  The age-appropriate recommendations were discussed with her and the rational for the tests reviewed.  When I am informed by the patient that another primary care physician has previously obtained the age-appropriate tests and they are up-to-date, only outstanding tests are ordered and referrals given as necessary.  Abnormal results of tests will be discussed with her when all of her results are completed.  Routine preventative health maintenance measures emphasized: Exercise/Diet/Weight control, Tobacco Warnings, Alcohol/Substance use risks and Stress Management  Orders No orders of the defined types were placed in this encounter.   No orders of the defined types were placed in this encounter.         F/U  No follow-ups on file.  Finis Bud, M.D. 06/08/2022 2:01 PM

## 2022-06-08 NOTE — Progress Notes (Signed)
Patients presents for annual exam today. She states doing well and not having trouble with incontinence. Patient has aged out of mammograms. Patient states no other questions or concerns at this time.

## 2022-06-23 ENCOUNTER — Other Ambulatory Visit: Payer: Self-pay | Admitting: Internal Medicine

## 2022-06-25 ENCOUNTER — Other Ambulatory Visit: Payer: Self-pay | Admitting: Internal Medicine

## 2022-06-25 ENCOUNTER — Other Ambulatory Visit: Payer: Self-pay | Admitting: *Deleted

## 2022-07-05 ENCOUNTER — Other Ambulatory Visit: Payer: Self-pay | Admitting: Internal Medicine

## 2022-07-09 ENCOUNTER — Ambulatory Visit (INDEPENDENT_AMBULATORY_CARE_PROVIDER_SITE_OTHER): Payer: PPO | Admitting: Nurse Practitioner

## 2022-07-09 ENCOUNTER — Encounter: Payer: Self-pay | Admitting: Nurse Practitioner

## 2022-07-09 VITALS — BP 167/76 | HR 91 | Temp 97.6°F | Ht 62.0 in | Wt 158.3 lb

## 2022-07-09 DIAGNOSIS — I10 Essential (primary) hypertension: Secondary | ICD-10-CM

## 2022-07-09 MED ORDER — VALSARTAN-HYDROCHLOROTHIAZIDE 80-12.5 MG PO TABS: 2.0000 | ORAL_TABLET | Freq: Every day | ORAL | 3 refills | Status: DC

## 2022-07-09 NOTE — Patient Instructions (Addendum)
Stop telmisartan- HCTZ Started on Valsartan HCTZ 80-12.5 mg, twice a day. Continue metoprolol

## 2022-07-09 NOTE — Progress Notes (Signed)
Established Patient Office Visit  Subjective:  Patient ID: Amanda Cruz, female    DOB: May 10, 1946  Age: 76 y.o. MRN: [id number]  CC:  Chief Complaint  Patient presents with   Hypertension    BP has been increasing. Dentist last Tuesday and he told her near stroke levels and he didn't do anything 188/99     HPI  Amanda Cruz presents for hypertension follow up. She states she sometimes check the BP at home and the number varies from 782-956 systolic.  Denies headache, shortness of breath, one-sided weakness or blurry vision.  HPI   Past Medical History:  Diagnosis Date   COVID-19 2021   GERD (gastroesophageal reflux disease)    Hypertension    Wears dentures    full upper and lower    Past Surgical History:  Procedure Laterality Date   ANTERIOR AND POSTERIOR REPAIR N/A 05/11/2021   Procedure: ANTERIOR (CYSTOCELE) AND POSTERIOR REPAIR (RECTOCELE);  Surgeon: Harlin Heys, MD;  Location: [facility];  Service: Gynecology;  Laterality: N/A;   CATARACT EXTRACTION W/PHACO Left 08/22/2019   Procedure: CATARACT EXTRACTION PHACO AND INTRAOCULAR LENS PLACEMENT (IOC) LEFT  9.98  01:10.4  14.2%;  Surgeon: Leandrew Koyanagi, MD;  Location: [place];  Service: Ophthalmology;  Laterality: Left;   CATARACT EXTRACTION W/PHACO Right 09/19/2019   Procedure: CATARACT EXTRACTION PHACO AND INTRAOCULAR LENS PLACEMENT (IOC) RIGHT PANOPTIX LENS 7.44  00:52.4  14.3%;  Surgeon: Leandrew Koyanagi, MD;  Location: [place];  Service: Ophthalmology;  Laterality: Right;   COLONOSCOPY WITH PROPOFOL N/A 05/09/2018   Procedure: COLONOSCOPY WITH PROPOFOL;  Surgeon: [name], MD;  Location: [facility];  Service: Endoscopy;  Laterality: N/A;   ESOPHAGOGASTRODUODENOSCOPY (EGD) WITH PROPOFOL N/A 02/05/2020   Procedure: ESOPHAGOGASTRODUODENOSCOPY (EGD) WITH PROPOFOL;  Surgeon: [name], MD;  Location: [facility];  Service: Endoscopy;  Laterality: N/A;   EYE  SURGERY     FOOT SURGERY Bilateral    KNEE ARTHROSCOPY Right    PUBOVAGINAL SLING N/A 05/11/2021   Procedure: PUBO-VAGINAL SLING (TOT);  Surgeon: Harlin Heys, MD;  Location: [facility];  Service: Gynecology;  Laterality: N/A;   TEAR DUCT PROBING  03/10/2015   TUBAL LIGATION     VAGINAL HYSTERECTOMY N/A 05/11/2021   Procedure: HYSTERECTOMY VAGINAL;  Surgeon: Harlin Heys, MD;  Location: [facility];  Service: Gynecology;  Laterality: N/A;    Family History  Problem Relation Age of Onset   Stroke Mother    Heart failure Mother    Thyroid disease Paternal Grandfather     Social History   Socioeconomic History   Marital status: Married    Spouse name: Not on file   Number of children: Not on file   Years of education: Not on file   Highest education level: Not on file  Occupational History   Not on file  Tobacco Use   Smoking status: Never   Smokeless tobacco: Never  Vaping Use   Vaping Use: Never used  Substance and Sexual Activity   Alcohol use: Not Currently    Alcohol/week: 1.0 standard drink of alcohol    Types: 1 Glasses of wine per week   Drug use: No   Sexual activity: Not Currently    Birth control/protection: Post-menopausal, Surgical    Comment: hysterectomy  Other Topics Concern   Not on file  Social History Narrative   Not on file   Social Determinants of Health   Financial Resource Strain: Low Risk  (07/27/2022)  Overall Financial Resource Strain (CARDIA)    Difficulty of Paying Living Expenses: Not hard at all  Food Insecurity: No Food Insecurity (07/27/2022)   Hunger Vital Sign    Worried About Running Out of Food in the Last Year: Never true    Ran Out of Food in the Last Year: Never true  Transportation Needs: No Transportation Needs (07/27/2022)   PRAPARE - Hydrologist (Medical): No    Lack of Transportation (Non-Medical): No  Physical Activity: Inactive (07/27/2022)   Exercise Vital Sign    Days of  Exercise per Week: 0 days    Minutes of Exercise per Session: 0 min  Stress: No Stress Concern Present (07/27/2022)   [place]    Feeling of Stress : Not at all  Social Connections: Moderately Isolated (07/27/2022)   Social Connection and Isolation Panel [NHANES]    Frequency of Communication with Friends and Family: More than three times a week    Frequency of Social Gatherings with Friends and Family: Twice a week    Attends Religious Services: Never    Marine scientist or Organizations: No    Attends Archivist Meetings: Never    Marital Status: Married  Human resources officer Violence: Not At Risk (07/27/2022)   Humiliation, Afraid, Rape, and Kick questionnaire    Fear of Current or Ex-Partner: No    Emotionally Abused: No    Physically Abused: No    Sexually Abused: No     Outpatient Medications Prior to Visit  Medication Sig Dispense Refill   albuterol (VENTOLIN HFA) 108 (90 Base) MCG/ACT inhaler Inhale 2 puffs into the lungs every 6 (six) hours as needed for wheezing or shortness of breath. 8 g 0   atorvastatin (LIPITOR) 40 MG tablet Take 1 tablet by mouth once daily 90 tablet 1   Biotin w/ Vitamins C & E (HAIR/SKIN/NAILS PO) Take 1 tablet by mouth daily.     Cholecalciferol (VITAMIN D3) 25 MCG (1000 UT) CAPS Take 1,000 Units by mouth daily.     fluticasone-salmeterol (ADVAIR) 100-50 MCG/ACT AEPB Inhale 1 puff into the lungs 2 (two) times daily. 1 each 3   Glucosamine 500 MG CAPS Take by mouth.     IRON-VITAMIN C PO Take 1 tablet by mouth daily. (Patient not taking: Reported on 07/27/2022)     metoprolol tartrate (LOPRESSOR) 50 MG tablet Take 1 tablet by mouth once daily 90 tablet 0   Propylene Glycol (SYSTANE BALANCE) 0.6 % SOLN Place 1 drop into both eyes daily as needed (dry eyes).     metoprolol succinate (TOPROL-XL) 50 MG 24 hr tablet Take 50 mg by mouth daily.      telmisartan-hydrochlorothiazide (MICARDIS HCT) 80-12.5 MG tablet Take 1 tablet by mouth once daily 30 tablet 0   No facility-administered medications prior to visit.    No Known Allergies  ROS Review of Systems  Constitutional: Negative.   HENT: Negative.    Eyes: Negative.   Respiratory:  Negative for chest tightness and shortness of breath.   Cardiovascular:  Negative for chest pain and palpitations.  Gastrointestinal: Negative.   Genitourinary: Negative.   Musculoskeletal: Negative.   Neurological: Negative.   Psychiatric/Behavioral: Negative.        Objective:    Physical Exam Constitutional:      Appearance: Normal appearance. She is overweight.  HENT:     Head: Normocephalic.     Right Ear: Tympanic  membrane normal.     Left Ear: Tympanic membrane normal.     Nose: Nose normal.     Mouth/Throat:     Mouth: Mucous membranes are moist.     Pharynx: Oropharynx is clear.  Eyes:     Extraocular Movements: Extraocular movements intact.     Conjunctiva/sclera: Conjunctivae normal.     Pupils: Pupils are equal, round, and reactive to light.  Cardiovascular:     Rate and Rhythm: Normal rate and regular rhythm.     Pulses: Normal pulses.     Heart sounds: Normal heart sounds.  Pulmonary:     Effort: Pulmonary effort is normal. No respiratory distress.     Breath sounds: Normal breath sounds. No rhonchi.  Abdominal:     General: Bowel sounds are normal.     Palpations: Abdomen is soft. There is no mass.     Tenderness: There is no abdominal tenderness.     Hernia: No hernia is present.  Musculoskeletal:        General: Normal range of motion.     Cervical back: Neck supple. No tenderness.  Skin:    General: Skin is warm.     Capillary Refill: Capillary refill takes less than 2 seconds.  Neurological:     General: No focal deficit present.     Mental Status: She is alert and oriented to person, place, and time. Mental status is at baseline.  Psychiatric:         Mood and Affect: Mood normal.        Behavior: Behavior normal.        Thought Content: Thought content normal.        Judgment: Judgment normal.     BP (!) 167/76   Pulse 91   Temp 97.6 F (36.4 C) (Temporal)   Ht _0  (1.575 m)   Wt 158 lb 4.8 oz (71.8 kg)   SpO2 97%   BMI 28.95 kg/m  Wt Readings from Last 3 Encounters:  07/27/22 158 lb (71.7 kg)  07/09/22 158 lb 4.8 oz (71.8 kg)  06/08/22 158 lb 9.6 oz (71.9 kg)     Health Maintenance  Topic Date Due   DTaP/Tdap/Td (2 - Td or Tdap) 08/21/2021   Zoster Vaccines- Shingrix (1 of 2) 09/01/2022 (Originally 03/02/1995)   COLONOSCOPY (Pts 45-27yr Insurance coverage will need to be confirmed)  05/10/2023   Medicare Annual Wellness (AWV)  07/28/2023   Pneumonia Vaccine 76 Years old  Completed   INFLUENZA VACCINE  Completed   DEXA SCAN  Completed   Hepatitis C Screening  Completed   HPV VACCINES  Aged Out   COVID-19 Vaccine  Discontinued    There are no preventive care reminders to display for this patient.  Lab Results  Component Value Date   TSH 2.24 02/16/2022   Lab Results  Component Value Date   WBC 4.1 02/16/2022   HGB 13.5 02/16/2022   HCT 39.5 02/16/2022   MCV 89.8 02/16/2022   PLT 191 02/16/2022   Lab Results  Component Value Date   NA 142 02/16/2022   K 4.0 02/16/2022   CO2 26 02/16/2022   GLUCOSE 97 02/16/2022   BUN 17 02/16/2022   CREATININE 0.88 02/16/2022   BILITOT 0.7 02/16/2022   AST 21 02/16/2022   ALT 16 02/16/2022   PROT 6.7 02/16/2022   CALCIUM 9.0 02/16/2022   ANIONGAP 8 05/11/2021   EGFR 68 02/16/2022   Lab Results  Component Value Date  CHOL 108 02/16/2022   Lab Results  Component Value Date   HDL 41 (L) 02/16/2022   Lab Results  Component Value Date   LDLCALC 46 02/16/2022   Lab Results  Component Value Date   TRIG 128 02/16/2022   Lab Results  Component Value Date   CHOLHDL 2.6 02/16/2022   No results found for: "HGBA1C"    Assessment & Plan:   Problem  List Items Addressed This Visit       Cardiovascular and Mediastinum   Essential hypertension - Primary    Patient BP  Vitals:   07/09/22 1553 07/09/22 1555  BP: (!) 172/78 (!) 167/76    in the office today. Discontinued telmisartan HCTZ. Started her on valsartan HCTZ 80- 12.5 mg twice daily and continue metoprolol. Encourage patient to check blood pressure at home and bring readings to next appointment. Encouraged her to consume low-salt heart healthy diet. We will continue to monitor.       Relevant Medications   valsartan-hydrochlorothiazide (DIOVAN-HCT) 80-12.5 MG tablet     Meds ordered this encounter  Medications   valsartan-hydrochlorothiazide (DIOVAN-HCT) 80-12.5 MG tablet    Sig: Take 2 tablets by mouth daily.    Dispense:  90 tablet    Refill:  3     Follow-up: No follow-ups on file.    Theresia Lo, NP

## 2022-07-15 ENCOUNTER — Telehealth: Payer: Self-pay | Admitting: Internal Medicine

## 2022-07-15 NOTE — Telephone Encounter (Signed)
R/S AWV ON 08/16/2022. If patient calls office, please transfer call to me jabber [phone].

## 2022-07-23 ENCOUNTER — Other Ambulatory Visit: Payer: Self-pay | Admitting: Internal Medicine

## 2022-07-27 ENCOUNTER — Ambulatory Visit (INDEPENDENT_AMBULATORY_CARE_PROVIDER_SITE_OTHER): Payer: PPO

## 2022-07-27 VITALS — Ht 62.0 in | Wt 158.0 lb

## 2022-07-27 DIAGNOSIS — M5432 Sciatica, left side: Secondary | ICD-10-CM

## 2022-07-27 DIAGNOSIS — Z0000 Encounter for general adult medical examination without abnormal findings: Secondary | ICD-10-CM | POA: Diagnosis not present

## 2022-07-27 NOTE — Progress Notes (Unsigned)
Virtual Visit via Telephone Note  I connected with  Amanda Cruz on 07/27/22 at  2:45 PM EST by telephone and verified that I am speaking with the correct person using two identifiers.  Location: Patient: home Provider: [facility] Persons participating in the virtual visit: patient/Nurse Health Advisor   I discussed the limitations, risks, security and privacy concerns of performing an evaluation and management service by telephone and the availability of in person appointments. The patient expressed understanding and agreed to proceed.  Interactive audio and video telecommunications were attempted between this nurse and patient, however failed, due to patient having technical difficulties OR patient did not have access to video capability.  We continued and completed visit with audio only.  Some vital signs may be absent or patient reported.   Dionisio David, LPN  Subjective:   Amanda Cruz is a 76 y.o. female who presents for Medicare Annual (Subsequent) preventive examination.  Review of Systems     Cardiac Risk Factors include: advanced age (>68mn, >>38women);hypertension     Objective:    There were no vitals filed for this visit. There is no height or weight on file to calculate BMI.     07/27/2022    2:50 PM 05/28/2021   10:07 AM 05/11/2021    6:54 AM 05/08/2021    1:11 PM 02/05/2020    8:15 AM 09/19/2019    7:52 AM 08/22/2019    8:40 AM  Advanced Directives  Does Patient Have a Medical Advance Directive? No No No No No No No  Would patient like information on creating a medical advance directive? No - Patient declined No - Patient declined No - Patient declined   Yes (MAU/Ambulatory/Procedural Areas - Information given) No - Patient declined    Current Medications (verified) Outpatient Encounter Medications as of 07/27/2022  Medication Sig   albuterol (VENTOLIN HFA) 108 (90 Base) MCG/ACT inhaler Inhale 2 puffs into the lungs every 6 (six) hours as needed for  wheezing or shortness of breath.   atorvastatin (LIPITOR) 40 MG tablet Take 1 tablet by mouth once daily   Biotin w/ Vitamins C & E (HAIR/SKIN/NAILS PO) Take 1 tablet by mouth daily.   Cholecalciferol (VITAMIN D3) 25 MCG (1000 UT) CAPS Take 1,000 Units by mouth daily.   fluticasone-salmeterol (ADVAIR) 100-50 MCG/ACT AEPB Inhale 1 puff into the lungs 2 (two) times daily.   Glucosamine 500 MG CAPS Take by mouth.   metoprolol tartrate (LOPRESSOR) 50 MG tablet Take 1 tablet by mouth once daily   Propylene Glycol (SYSTANE BALANCE) 0.6 % SOLN Place 1 drop into both eyes daily as needed (dry eyes).   valsartan-hydrochlorothiazide (DIOVAN-HCT) 80-12.5 MG tablet Take 2 tablets by mouth daily.   IRON-VITAMIN C PO Take 1 tablet by mouth daily. (Patient not taking: Reported on 07/27/2022)   No facility-administered encounter medications on file as of 07/27/2022.    Allergies (verified) Patient has no known allergies.   History: Past Medical History:  Diagnosis Date   COVID-19 2021   GERD (gastroesophageal reflux disease)    Hypertension    Wears dentures    full upper and lower   Past Surgical History:  Procedure Laterality Date   ANTERIOR AND POSTERIOR REPAIR N/A 05/11/2021   Procedure: ANTERIOR (CYSTOCELE) AND POSTERIOR REPAIR (RECTOCELE);  Surgeon: EHarlin Heys MD;  Location: [facility] ORS;  Service: Gynecology;  Laterality: N/A;   CATARACT EXTRACTION W/PHACO Left 08/22/2019   Procedure: CATARACT EXTRACTION PHACO AND INTRAOCULAR LENS PLACEMENT (IOC) LEFT  9.98  01:10.4  14.2%;  Surgeon: Leandrew Koyanagi, MD;  Location: [place];  Service: Ophthalmology;  Laterality: Left;   CATARACT EXTRACTION W/PHACO Right 09/19/2019   Procedure: CATARACT EXTRACTION PHACO AND INTRAOCULAR LENS PLACEMENT (IOC) RIGHT PANOPTIX LENS 7.44  00:52.4  14.3%;  Surgeon: Leandrew Koyanagi, MD;  Location: [place];  Service: Ophthalmology;  Laterality: Right;   COLONOSCOPY WITH PROPOFOL N/A  05/09/2018   Procedure: COLONOSCOPY WITH PROPOFOL;  Surgeon: [name], MD;  Location: [facility];  Service: Endoscopy;  Laterality: N/A;   ESOPHAGOGASTRODUODENOSCOPY (EGD) WITH PROPOFOL N/A 02/05/2020   Procedure: ESOPHAGOGASTRODUODENOSCOPY (EGD) WITH PROPOFOL;  Surgeon: [name], MD;  Location: [facility] ENDOSCOPY;  Service: Endoscopy;  Laterality: N/A;   EYE SURGERY     FOOT SURGERY Bilateral    KNEE ARTHROSCOPY Right    PUBOVAGINAL SLING N/A 05/11/2021   Procedure: PUBO-VAGINAL SLING (TOT);  Surgeon: Harlin Heys, MD;  Location: [facility] OR[facility];  Service: Gynecology;  Laterality: N/A;   TEAR DUCT PROBING  03/10/2015   TUBAL LIGATION     VAGINAL HYSTERECTOMY N/A 05/11/2021   Procedure: HYSTERECTOMY VAGINAL;  Surgeon: Harlin Heys, MD;  Location: [facility];  Service: Gynecology;  Laterality: N/A;   Family History  Problem Relation Age of Onset   Stroke Mother    Heart failure Mother    Thyroid disease Paternal Grandfather    Social History   Socioeconomic History   Marital status: Married    Spouse name: Not on file   Number of children: Not on file   Years of education: Not on file   Highest education level: Not on file  Occupational History   Not on file  Tobacco Use   Smoking status: Never   Smokeless tobacco: Never  Vaping Use   Vaping Use: Never used  Substance and Sexual Activity   Alcohol use: Not Currently    Alcohol/week: 1.0 standard drink of alcohol    Types: 1 Glasses of wine per week   Drug use: No   Sexual activity: Not Currently    Birth control/protection: Post-menopausal, Surgical    Comment: hysterectomy  Other Topics Concern   Not on file  Social History Narrative   Not on file   Social Determinants of Health   Financial Resource Strain: Low Risk  (07/27/2022)   Overall Financial Resource Strain (CARDIA)    Difficulty of Paying Living Expenses: Not hard at all  Food Insecurity: No Food Insecurity (07/27/2022)   Hunger Vital Sign     Worried About Running Out of Food in the Last Year: Never true    Ran Out of Food in the Last Year: Never true  Transportation Needs: No Transportation Needs (07/27/2022)   PRAPARE - Hydrologist (Medical): No    Lack of Transportation (Non-Medical): No  Physical Activity: Inactive (07/27/2022)   Exercise Vital Sign    Days of Exercise per Week: 0 days    Minutes of Exercise per Session: 0 min  Stress: No Stress Concern Present (07/27/2022)   [place]    Feeling of Stress : Not at all  Social Connections: Moderately Isolated (07/27/2022)   Social Connection and Isolation Panel [NHANES]    Frequency of Communication with Friends and Family: More than three times a week    Frequency of Social Gatherings with Friends and Family: Twice a week    Attends Religious Services: Never    Active Member of  Clubs or Organizations: No    Attends Music therapist: Never    Marital Status: Married    Tobacco Counseling Counseling given: Not Answered   Clinical Intake:  Pre-visit preparation completed: Yes  Pain : No/denies pain     Nutritional Risks: None Diabetes: No  How often do you need to have someone help you when you read instructions, pamphlets, or other written materials from your doctor or pharmacy?: 1 - Never  Diabetic?no  Interpreter Needed?: No  Information entered by :: Kirke Shaggy, LPN   Activities of Daily Living    07/27/2022    2:52 PM  In your present state of health, do you have any difficulty performing the following activities:  Hearing? 1  Vision? 0  Difficulty concentrating or making decisions? 0  Walking or climbing stairs? 1  Dressing or bathing? 0  Doing errands, shopping? 0  Preparing Food and eating ? N  Using the Toilet? N  In the past six months, have you accidently leaked urine? N  Do you have problems with loss of bowel control? N   Managing your Medications? N  Managing your Finances? N  Housekeeping or managing your Housekeeping? N    Patient Care Team: [name], MD as PCP - General (Internal Medicine)  Indicate any recent Medical Services you may have received from other than [facility] providers in the past year (date may be approximate).     Assessment:   This is a routine wellness examination for Amanda Cruz.  Hearing/Vision screen Hearing Screening - Comments:: Wears aids Vision Screening - Comments:: No glasses- [facility]  Dietary issues and exercise activities discussed: Current Exercise Habits: The patient does not participate in regular exercise at present   Goals Addressed             This Visit's Progress    DIET - EAT MORE FRUITS AND VEGETABLES         Depression Screen    07/27/2022    2:48 PM 05/04/2022    2:45 PM 05/28/2021   10:03 AM 04/21/2021    2:10 PM 08/08/2020    2:50 PM 04/17/2020    3:22 PM  PHQ 2/9 Scores  PHQ - 2 Score 0 0 0 0 0 0  PHQ- 9 Score 0     0    Fall Risk    07/27/2022    2:51 PM 06/01/2022    3:14 PM 05/28/2021   10:08 AM 08/08/2020    2:50 PM 04/17/2020    3:26 PM  [place] in the past year? '1 1 1 '$ 0   Number falls in past yr: 0 0 0 0   Injury with Fall? 0 0 1 0   Risk for fall due to : History of fall(s) No Fall Risks History of fall(s)  No Fall Risks  Follow up Falls evaluation completed;Falls prevention discussed Falls evaluation completed Falls evaluation completed  Falls evaluation completed    FALL RISK PREVENTION PERTAINING TO THE HOME:  Any stairs in or around the home? No  If so, are there any without handrails? No  Home free of loose throw rugs in walkways, pet beds, electrical cords, etc? Yes  Adequate lighting in your home to reduce risk of falls? Yes   ASSISTIVE DEVICES UTILIZED TO PREVENT FALLS:  Life alert? No  Use of a cane, walker or w/c? No  Grab bars in the bathroom? Yes  Shower chair or bench in shower? Yes  Elevated toilet seat or a handicapped toilet? No    Cognitive Function:    05/28/2021   10:11 AM  MMSE - Mini Mental State Exam  Orientation to time 5  Orientation to Place 5  Registration 3  Attention/ Calculation 5  Recall 3  Language- name 2 objects 2  Language- repeat 1  Language- follow 3 step command 3  Language- read & follow direction 1  Write a sentence 0  Copy design 0  Total score 28        07/27/2022    2:53 PM 05/28/2021   10:12 AM  6CIT Screen  What Year? 0 points 0 points  What month? 0 points 0 points  What time? 0 points 0 points  Count back from 20 0 points 0 points  Months in reverse 0 points 0 points  Repeat phrase 0 points 0 points  Total Score 0 points 0 points    Immunizations Immunization History  Administered Date(s) Administered   Fluad Quad(high Dose 65+) 07/01/2020, 06/24/2021, 06/01/2022   Influenza, High Dose Seasonal PF 08/02/2018   Influenza-Unspecified 06/25/2019   PFIZER(Purple Top)SARS-COV-2 Vaccination 10/24/2019, 11/14/2019, 09/02/2020, 07/06/2021   Pneumococcal Conjugate-13 07/30/2014   Pneumococcal Polysaccharide-23 05/25/2016   Tdap 08/22/2011    TDAP status: Due, Education has been provided regarding the importance of this vaccine. Advised may receive this vaccine at local pharmacy or Health Dept. Aware to provide a copy of the vaccination record if obtained from local pharmacy or Health Dept. Verbalized acceptance and understanding.  Flu Vaccine status: Up to date  Pneumococcal vaccine status: Up to date  Covid-19 vaccine status: Completed vaccines  Qualifies for Shingles Vaccine? Yes   Zostavax completed No   Shingrix Completed?: No.    Education has been provided regarding the importance of this vaccine. Patient has been advised to call insurance company to determine out of pocket expense if they have not yet received this vaccine. Advised may also receive vaccine at local pharmacy or Health Dept. Verbalized  acceptance and understanding.  Screening Tests Health Maintenance  Topic Date Due   Zoster Vaccines- Shingrix (1 of 2) 09/01/2022 (Originally 03/02/1995)   COLONOSCOPY (Pts 45-61yr Insurance coverage will need to be confirmed)  05/10/2023   Medicare Annual Wellness (AWV)  07/28/2023   Pneumonia Vaccine 76 Years old  Completed   INFLUENZA VACCINE  Completed   DEXA SCAN  Completed   Hepatitis C Screening  Completed   HPV VACCINES  Aged Out   COVID-19 Vaccine  Discontinued    Health Maintenance  There are no preventive care reminders to display for this patient.   Colorectal cancer screening: No longer required.   Mammogram status: No longer required due to age.  Bone Density status: Completed 11/07/12. Results reflect: Bone density results: NORMAL. Repeat every 5 years.- declined referral  Lung Cancer Screening: (Low Dose CT Chest recommended if Age 77-80years, 30 pack-year currently smoking OR have quit w/in 15years.) does not qualify.   Additional Screening:  Hepatitis C Screening: does qualify; Completed 02/16/22  Vision Screening: Recommended annual ophthalmology exams for early detection of glaucoma and other disorders of the eye. Is the patient up to date with their annual eye exam?  Yes  Who is the provider or what is the name of the office in which the patient attends annual eye exams? [facility] is not established with a provider, would they like to be referred to a provider to establish care? No .   Dental Screening:  Recommended annual dental exams for proper oral hygiene  Community Resource Referral / Chronic Care Management: CRR required this visit?  No   CCM required this visit?  No      Plan:     I have personally reviewed and noted the following in the patient's chart:   Medical and social history Use of alcohol, tobacco or illicit drugs  Current medications and supplements including opioid prescriptions. Patient is not currently taking opioid  prescriptions. Functional ability and status Nutritional status Physical activity Advanced directives List of other physicians Hospitalizations, surgeries, and ER visits in previous 12 months Vitals Screenings to include cognitive, depression, and falls Referrals and appointments  In addition, I have reviewed and discussed with patient certain preventive protocols, quality metrics, and best practice recommendations. A written personalized care plan for preventive services as well as general preventive health recommendations were provided to patient.     Dionisio David, LPN   [id number]   Nurse Notes: none

## 2022-07-27 NOTE — Progress Notes (Signed)
Established Patient Office Visit  Subjective:  Patient ID: Amanda Cruz, female    DOB: [birth year]  Age: 76 y.o. MRN: [id number]  CC: No chief complaint on file.   HPI  Amanda Cruz presents for swelling of both legs  Past Medical History:  Diagnosis Date   COVID-19 2021   GERD (gastroesophageal reflux disease)    Hypertension    Wears dentures    full upper and lower    Past Surgical History:  Procedure Laterality Date   ANTERIOR AND POSTERIOR REPAIR N/A 05/11/2021   Procedure: ANTERIOR (CYSTOCELE) AND POSTERIOR REPAIR (RECTOCELE);  Surgeon: Harlin Heys, MD;  Location: [facility] ORS;  Service: Gynecology;  Laterality: N/A;   CATARACT EXTRACTION W/PHACO Left 08/22/2019   Procedure: CATARACT EXTRACTION PHACO AND INTRAOCULAR LENS PLACEMENT (IOC) LEFT  9.98  01:10.4  14.2%;  Surgeon: Leandrew Koyanagi, MD;  Location: [place];  Service: Ophthalmology;  Laterality: Left;   CATARACT EXTRACTION W/PHACO Right 09/19/2019   Procedure: CATARACT EXTRACTION PHACO AND INTRAOCULAR LENS PLACEMENT (IOC) RIGHT PANOPTIX LENS 7.44  00:52.4  14.3%;  Surgeon: Leandrew Koyanagi, MD;  Location: [place];  Service: Ophthalmology;  Laterality: Right;   COLONOSCOPY WITH PROPOFOL N/A 05/09/2018   Procedure: COLONOSCOPY WITH PROPOFOL;  Surgeon: [name], MD;  Location[facility] END[facility]COPY;  Service: Endoscopy;  Laterality: N/A;   ESOPHAGOGASTRODUODENOSCOPY (EGD) WITH PROPOFOL N/A 02/05/2020   Procedure: ESOPHAGOGASTRODUODENOSCOPY (EGD) WITH PROPOFOL;  Surgeon: [name], MD;  Location: [facility] ENDOSCOPY;  Service: Endoscopy;  Laterality: N/A;   EYE SURGERY     FOOT SURGERY Bilateral    KNEE ARTHROSCOPY Right    PUBOVAGINAL SLING N/A 05/11/2021   Procedure: PUBO-VAGINAL SLING (TOT);  Surgeon: Harlin Heys, MD;  Location: [facility];  Service: Gynecology;  Laterality: N/A;   TEAR DUCT PROBING  03/10/2015   TUBAL LIGATION     VAGINAL HYSTERECTOMY N/A 05/11/2021    Procedure: HYSTERECTOMY VAGINAL;  Surgeon: Harlin Heys, MD;  Location: [facility] OR[facility];  Service: Gynecology;  Laterality: N/A;    Family History  Problem Relation Age of Onset   Stroke Mother    Heart failure Mother    Thyroid disease Paternal Grandfather     Social History   Socioeconomic History   Marital status: Married    Spouse name: Not on file   Number of children: Not on file   Years of education: Not on file   Highest education level: Not on file  Occupational History   Not on file  Tobacco Use   Smoking status: Never   Smokeless tobacco: Never  Vaping Use   Vaping Use: Never used  Substance and Sexual Activity   Alcohol use: Not Currently    Alcohol/week: 1.0 standard drink of alcohol    Types: 1 Glasses of wine per week   Drug use: No   Sexual activity: Not Currently    Birth control/protection: Post-menopausal, Surgical    Comment: hysterectomy  Other Topics Concern   Not on file  Social History Narrative   Not on file   Social Determinants of Health   Financial Resource Strain: Low Risk  (07/27/2022)   Overall Financial Resource Strain (CARDIA)    Difficulty of Paying Living Expenses: Not hard at all  Food Insecurity: No Food Insecurity (07/27/2022)   Hunger Vital Sign    Worried About Running Out of Food in the Last Year: Never true    Ran Out of Food in the Last Year: Never true  Transportation Needs: No Transportation Needs (07/27/2022)   PRAPARE - Hydrologist (Medical): No    Lack of Transportation (Non-Medical): No  Physical Activity: Inactive (07/27/2022)   Exercise Vital Sign    Days of Exercise per Week: 0 days    Minutes of Exercise per Session: 0 min  Stress: No Stress Concern Present (07/27/2022)   [place]    Feeling of Stress : Not at all  Social Connections: Moderately Isolated (07/27/2022)   Social Connection and Isolation Panel  [NHANES]    Frequency of Communication with Friends and Family: More than three times a week    Frequency of Social Gatherings with Friends and Family: Twice a week    Attends Religious Services: Never    Marine scientist or Organizations: No    Attends Archivist Meetings: Never    Marital Status: Married  Human resources officer Violence: Not At Risk (07/27/2022)   Humiliation, Afraid, Rape, and Kick questionnaire    Fear of Current or Ex-Partner: No    Emotionally Abused: No    Physically Abused: No    Sexually Abused: No     Current Outpatient Medications:    albuterol (VENTOLIN HFA) 108 (90 Base) MCG/ACT inhaler, Inhale 2 puffs into the lungs every 6 (six) hours as needed for wheezing or shortness of breath., Disp: 8 g, Rfl: 0   atorvastatin (LIPITOR) 40 MG tablet, Take 1 tablet by mouth once daily, Disp: 90 tablet, Rfl: 1   Biotin w/ Vitamins C & E (HAIR/SKIN/NAILS PO), Take 1 tablet by mouth daily., Disp: , Rfl:    Cholecalciferol (VITAMIN D3) 25 MCG (1000 UT) CAPS, Take 1,000 Units by mouth daily., Disp: , Rfl:    fluticasone-salmeterol (ADVAIR) 100-50 MCG/ACT AEPB, Inhale 1 puff into the lungs 2 (two) times daily., Disp: 1 each, Rfl: 3   Glucosamine 500 MG CAPS, Take by mouth., Disp: , Rfl:    metoprolol tartrate (LOPRESSOR) 50 MG tablet, Take 1 tablet by mouth once daily, Disp: 90 tablet, Rfl: 0   Propylene Glycol (SYSTANE BALANCE) 0.6 % SOLN, Place 1 drop into both eyes daily as needed (dry eyes)., Disp: , Rfl:    valsartan-hydrochlorothiazide (DIOVAN-HCT) 80-12.5 MG tablet, Take 2 tablets by mouth daily., Disp: 90 tablet, Rfl: 3   IRON-VITAMIN C PO, Take 1 tablet by mouth daily. (Patient not taking: Reported on 07/27/2022), Disp: , Rfl:    No Known Allergies  ROS Review of Systems    Objective:    Physical Exam  Ht 5' 2" (1.575 m)   Wt 158 lb (71.7 kg)   BMI 28.90 kg/m  Wt Readings from Last 3 Encounters:  07/27/22 158 lb (71.7 kg)  07/09/22 158 lb 4.8  oz (71.8 kg)  06/08/22 158 lb 9.6 oz (71.9 kg)     There are no preventive care reminders to display for this patient.  There are no preventive care reminders to display for this patient.  Lab Results  Component Value Date   TSH 2.24 02/16/2022   Lab Results  Component Value Date   WBC 4.1 02/16/2022   HGB 13.5 02/16/2022   HCT 39.5 02/16/2022   MCV 89.8 02/16/2022   PLT 191 02/16/2022   Lab Results  Component Value Date   NA 142 02/16/2022   K 4.0 02/16/2022   CO2 26 02/16/2022   GLUCOSE 97 02/16/2022   BUN 17 02/16/2022   CREATININE 0.88 02/16/2022  BILITOT 0.7 02/16/2022   AST 21 02/16/2022   ALT 16 02/16/2022   PROT 6.7 02/16/2022   CALCIUM 9.0 02/16/2022   ANIONGAP 8 05/11/2021   EGFR 68 02/16/2022   Lab Results  Component Value Date   CHOL 108 02/16/2022   Lab Results  Component Value Date   HDL 41 (L) 02/16/2022   Lab Results  Component Value Date   LDLCALC 46 02/16/2022   Lab Results  Component Value Date   TRIG 128 02/16/2022   Lab Results  Component Value Date   CHOLHDL 2.6 02/16/2022   No results found for: "HGBA1C"    Assessment & Plan:   Problem List Items Addressed This Visit   None Visit Diagnoses     Encounter for Medicare annual wellness exam    -  Primary       No orders of the defined types were placed in this encounter.   Follow-up: Return in 1 year (on 07/28/2023).    [name], MD

## 2022-07-27 NOTE — Patient Instructions (Signed)
Amanda Cruz , Thank you for taking time to come for your Medicare Wellness Visit. I appreciate your ongoing commitment to your health goals. Please review the following plan we discussed and let me know if I can assist you in the future.   Screening recommendations/referrals: Colonoscopy: aged out Mammogram: aged out Bone Density: 11/07/12, declined Recommended yearly ophthalmology/optometry visit for glaucoma screening and checkup Recommended yearly dental visit for hygiene and checkup  Vaccinations: Influenza vaccine: 06/01/22 Pneumococcal vaccine: 05/25/16 Tdap vaccine: 08/22/11, due if have injury  Shingles vaccine: n/d   Covid-19:10/24/19, 11/14/19, 09/02/20, 07/06/21  Advanced directives: no  Conditions/risks identified: none  Next appointment: Follow up in one year for your annual wellness visit 08/01/23 @ 1 pm by phone   Preventive Care 65 Years and Older, Female Preventive care refers to lifestyle choices and visits with your health care provider that can promote health and wellness. What does preventive care include? A yearly physical exam. This is also called an annual well check. Dental exams once or twice a year. Routine eye exams. Ask your health care provider how often you should have your eyes checked. Personal lifestyle choices, including: Daily care of your teeth and gums. Regular physical activity. Eating a healthy diet. Avoiding tobacco and drug use. Limiting alcohol use. Practicing safe sex. Taking low-dose aspirin every day. Taking vitamin and mineral supplements as recommended by your health care provider. What happens during an annual well check? The services and screenings done by your health care provider during your annual well check will depend on your age, overall health, lifestyle risk factors, and family history of disease. Counseling  Your health care provider may ask you questions about your: Alcohol use. Tobacco use. Drug use. Emotional  well-being. Home and relationship well-being. Sexual activity. Eating habits. History of falls. Memory and ability to understand (cognition). Work and work Statistician. Reproductive health. Screening  You may have the following tests or measurements: Height, weight, and BMI. Blood pressure. Lipid and cholesterol levels. These may be checked every 5 years, or more frequently if you are over 8 years old. Skin check. Lung cancer screening. You may have this screening every year starting at age 37 if you have a 30-pack-year history of smoking and currently smoke or have quit within the past 15 years. Fecal occult blood test (FOBT) of the stool. You may have this test every year starting at age 42. Flexible sigmoidoscopy or colonoscopy. You may have a sigmoidoscopy every 5 years or a colonoscopy every 10 years starting at age 32. Hepatitis C blood test. Hepatitis B blood test. Sexually transmitted disease (STD) testing. Diabetes screening. This is done by checking your blood sugar (glucose) after you have not eaten for a while (fasting). You may have this done every 1-3 years. Bone density scan. This is done to screen for osteoporosis. You may have this done starting at age 68. Mammogram. This may be done every 1-2 years. Talk to your health care provider about how often you should have regular mammograms. Talk with your health care provider about your test results, treatment options, and if necessary, the need for more tests. Vaccines  Your health care provider may recommend certain vaccines, such as: Influenza vaccine. This is recommended every year. Tetanus, diphtheria, and acellular pertussis (Tdap, Td) vaccine. You may need a Td booster every 10 years. Zoster vaccine. You may need this after age 22. Pneumococcal 13-valent conjugate (PCV13) vaccine. One dose is recommended after age 88. Pneumococcal polysaccharide (PPSV23) vaccine. One dose is  recommended after age 34. Talk to your  health care provider about which screenings and vaccines you need and how often you need them. This information is not intended to replace advice given to you by your health care provider. Make sure you discuss any questions you have with your health care provider. Document Released: 09/12/2015 Document Revised: 05/05/2016 Document Reviewed: 06/17/2015 Elsevier Interactive Patient Education  2017 [place] Prevention in the Home Falls can cause injuries. They can happen to people of all ages. There are many things you can do to make your home safe and to help prevent falls. What can I do on the outside of my home? Regularly fix the edges of walkways and driveways and fix any cracks. Remove anything that might make you trip as you walk through a door, such as a raised step or threshold. Trim any bushes or trees on the path to your home. Use bright outdoor lighting. Clear any walking paths of anything that might make someone trip, such as rocks or tools. Regularly check to see if handrails are loose or broken. Make sure that both sides of any steps have handrails. Any raised decks and porches should have guardrails on the edges. Have any leaves, snow, or ice cleared regularly. Use sand or salt on walking paths during winter. Clean up any spills in your garage right away. This includes oil or grease spills. What can I do in the bathroom? Use night lights. Install grab bars by the toilet and in the tub and shower. Do not use towel bars as grab bars. Use non-skid mats or decals in the tub or shower. If you need to sit down in the shower, use a plastic, non-slip stool. Keep the floor dry. Clean up any water that spills on the floor as soon as it happens. Remove soap buildup in the tub or shower regularly. Attach bath mats securely with double-sided non-slip rug tape. Do not have throw rugs and other things on the floor that can make you trip. What can I do in the bedroom? Use night  lights. Make sure that you have a light by your bed that is easy to reach. Do not use any sheets or blankets that are too big for your bed. They should not hang down onto the floor. Have a firm chair that has side arms. You can use this for support while you get dressed. Do not have throw rugs and other things on the floor that can make you trip. What can I do in the kitchen? Clean up any spills right away. Avoid walking on wet floors. Keep items that you use a lot in easy-to-reach places. If you need to reach something above you, use a strong step stool that has a grab bar. Keep electrical cords out of the way. Do not use floor polish or wax that makes floors slippery. If you must use wax, use non-skid floor wax. Do not have throw rugs and other things on the floor that can make you trip. What can I do with my stairs? Do not leave any items on the stairs. Make sure that there are handrails on both sides of the stairs and use them. Fix handrails that are broken or loose. Make sure that handrails are as long as the stairways. Check any carpeting to make sure that it is firmly attached to the stairs. Fix any carpet that is loose or worn. Avoid having throw rugs at the top or bottom of the stairs. If  you do have throw rugs, attach them to the floor with carpet tape. Make sure that you have a light switch at the top of the stairs and the bottom of the stairs. If you do not have them, ask someone to add them for you. What else can I do to help prevent falls? Wear shoes that: Do not have high heels. Have rubber bottoms. Are comfortable and fit you well. Are closed at the toe. Do not wear sandals. If you use a stepladder: Make sure that it is fully opened. Do not climb a closed stepladder. Make sure that both sides of the stepladder are locked into place. Ask someone to hold it for you, if possible. Clearly mark and make sure that you can see: Any grab bars or handrails. First and last  steps. Where the edge of each step is. Use tools that help you move around (mobility aids) if they are needed. These include: Canes. Walkers. Scooters. Crutches. Turn on the lights when you go into a dark area. Replace any light bulbs as soon as they burn out. Set up your furniture so you have a clear path. Avoid moving your furniture around. If any of your floors are uneven, fix them. If there are any pets around you, be aware of where they are. Review your medicines with your doctor. Some medicines can make you feel dizzy. This can increase your chance of falling. Ask your doctor what other things that you can do to help prevent falls. This information is not intended to replace advice given to you by your health care provider. Make sure you discuss any questions you have with your health care provider. Document Released: 06/12/2009 Document Revised: 01/22/2016 Document Reviewed: 09/20/2014 Elsevier Interactive Patient Education  2017 [employer].

## 2022-07-29 ENCOUNTER — Encounter: Payer: Self-pay | Admitting: Nurse Practitioner

## 2022-07-29 NOTE — Assessment & Plan Note (Addendum)
Patient BP  Vitals:   07/09/22 1553 07/09/22 1555  BP: (!) 172/78 (!) 167/76    in the office today. Discontinued telmisartan HCTZ. Started her on valsartan HCTZ 80- 12.5 mg twice daily and continue metoprolol. Encourage patient to check blood pressure at home and bring readings to next appointment. Encouraged her to consume low-salt heart healthy diet. We will continue to monitor.

## 2022-08-05 ENCOUNTER — Encounter: Payer: Self-pay | Admitting: Nurse Practitioner

## 2022-08-05 ENCOUNTER — Ambulatory Visit (INDEPENDENT_AMBULATORY_CARE_PROVIDER_SITE_OTHER): Payer: PPO | Admitting: Nurse Practitioner

## 2022-08-05 VITALS — BP 150/73 | HR 68 | Temp 97.3°F | Ht 62.0 in | Wt 160.6 lb

## 2022-08-05 DIAGNOSIS — I10 Essential (primary) hypertension: Secondary | ICD-10-CM

## 2022-08-05 NOTE — Progress Notes (Signed)
Established Patient Office Visit  Subjective:  Patient ID: Amanda Cruz, female    DOB: [birth year]  Age: 76 y.o. MRN: [id number]  CC:  Chief Complaint  Patient presents with   Hypertension    1 month fu     HPI  Amanda Cruz presents for blood pressure follow-up. Her BP readings at home  11/14: 138/78, 11/17: 148/64, 11/20: 160/96, 11/24:140/52, 11/27: 130/68, 12/4: 138/62, 12/7 : 145/59  HPI   Past Medical History:  Diagnosis Date   COVID-19 2021   GERD (gastroesophageal reflux disease)    Hypertension    Wears dentures    full upper and lower    Past Surgical History:  Procedure Laterality Date   ANTERIOR AND POSTERIOR REPAIR N/A 05/11/2021   Procedure: ANTERIOR (CYSTOCELE) AND POSTERIOR REPAIR (RECTOCELE);  Surgeon: Harlin Heys, MD;  Location: [facility];  Service: Gynecology;  Laterality: N/A;   CATARACT EXTRACTION W/PHACO Left 08/22/2019   Procedure: CATARACT EXTRACTION PHACO AND INTRAOCULAR LENS PLACEMENT (IOC) LEFT  9.98  01:10.4  14.2%;  Surgeon: Leandrew Koyanagi, MD;  Location: [place];  Service: Ophthalmology;  Laterality: Left;   CATARACT EXTRACTION W/PHACO Right 09/19/2019   Procedure: CATARACT EXTRACTION PHACO AND INTRAOCULAR LENS PLACEMENT (IOC) RIGHT PANOPTIX LENS 7.44  00:52.4  14.3%;  Surgeon: Leandrew Koyanagi, MD;  Location: [place];  Service: Ophthalmology;  Laterality: Right;   COLONOSCOPY WITH PROPOFOL N/A 05/09/2018   Procedure: COLONOSCOPY WITH PROPOFOL;  Surgeon: [name], MD;  Location: [facility];  Service: Endoscopy;  Laterality: N/A;   ESOPHAGOGASTRODUODENOSCOPY (EGD) WITH PROPOFOL N/A 02/05/2020   Procedure: ESOPHAGOGASTRODUODENOSCOPY (EGD) WITH PROPOFOL;  Surgeon: [name], MD;  Location: [facility];  Service: Endoscopy;  Laterality: N/A;   EYE SURGERY     FOOT SURGERY Bilateral    KNEE ARTHROSCOPY Right    PUBOVAGINAL SLING N/A 05/11/2021   Procedure: PUBO-VAGINAL SLING (TOT);   Surgeon: Harlin Heys, MD;  Location: [facility];  Service: Gynecology;  Laterality: N/A;   TEAR DUCT PROBING  03/10/2015   TUBAL LIGATION     VAGINAL HYSTERECTOMY N/A 05/11/2021   Procedure: HYSTERECTOMY VAGINAL;  Surgeon: Harlin Heys, MD;  Location: [facility];  Service: Gynecology;  Laterality: N/A;    Family History  Problem Relation Age of Onset   Stroke Mother    Heart failure Mother    Thyroid disease Paternal Grandfather     Social History   Socioeconomic History   Marital status: Married    Spouse name: Not on file   Number of children: Not on file   Years of education: Not on file   Highest education level: Not on file  Occupational History   Not on file  Tobacco Use   Smoking status: Never   Smokeless tobacco: Never  Vaping Use   Vaping Use: Never used  Substance and Sexual Activity   Alcohol use: Not Currently    Alcohol/week: 1.0 standard drink of alcohol    Types: 1 Glasses of wine per week   Drug use: No   Sexual activity: Not Currently    Birth control/protection: Post-menopausal, Surgical    Comment: hysterectomy  Other Topics Concern   Not on file  Social History Narrative   Not on file   Social Determinants of Health   Financial Resource Strain: Low Risk  (07/27/2022)   Overall Financial Resource Strain (CARDIA)    Difficulty of Paying Living Expenses: Not hard at all  Food Insecurity: No Food Insecurity (  07/27/2022)   Hunger Vital Sign    Worried About Running Out of Food in the Last Year: Never true    [place] in the Last Year: Never true  Transportation Needs: No Transportation Needs (07/27/2022)   PRAPARE - Hydrologist (Medical): No    Lack of Transportation (Non-Medical): No  Physical Activity: Inactive (07/27/2022)   Exercise Vital Sign    Days of Exercise per Week: 0 days    Minutes of Exercise per Session: 0 min  Stress: No Stress Concern Present (07/27/2022)   [place]    Feeling of Stress : Not at all  Social Connections: Moderately Isolated (07/27/2022)   Social Connection and Isolation Panel [NHANES]    Frequency of Communication with Friends and Family: More than three times a week    Frequency of Social Gatherings with Friends and Family: Twice a week    Attends Religious Services: Never    Marine scientist or Organizations: No    Attends Archivist Meetings: Never    Marital Status: Married  Human resources officer Violence: Not At Risk (07/27/2022)   Humiliation, Afraid, Rape, and Kick questionnaire    Fear of Current or Ex-Partner: No    Emotionally Abused: No    Physically Abused: No    Sexually Abused: No     Outpatient Medications Prior to Visit  Medication Sig Dispense Refill   albuterol (VENTOLIN HFA) 108 (90 Base) MCG/ACT inhaler Inhale 2 puffs into the lungs every 6 (six) hours as needed for wheezing or shortness of breath. 8 g 0   atorvastatin (LIPITOR) 40 MG tablet Take 1 tablet by mouth once daily 90 tablet 1   Biotin w/ Vitamins C & E (HAIR/SKIN/NAILS PO) Take 1 tablet by mouth daily.     Cholecalciferol (VITAMIN D3) 25 MCG (1000 UT) CAPS Take 1,000 Units by mouth daily.     fluticasone-salmeterol (ADVAIR) 100-50 MCG/ACT AEPB Inhale 1 puff into the lungs 2 (two) times daily. 1 each 3   Glucosamine 500 MG CAPS Take by mouth.     IRON-VITAMIN C PO Take 1 tablet by mouth daily.     metoprolol tartrate (LOPRESSOR) 50 MG tablet Take 1 tablet by mouth once daily 90 tablet 0   Propylene Glycol (SYSTANE BALANCE) 0.6 % SOLN Place 1 drop into both eyes daily as needed (dry eyes).     valsartan-hydrochlorothiazide (DIOVAN-HCT) 80-12.5 MG tablet Take 2 tablets by mouth daily. 90 tablet 3   No facility-administered medications prior to visit.    No Known Allergies  ROS Review of Systems  Constitutional: Negative.   HENT: Negative.    Eyes: Negative.   Respiratory:   Negative for shortness of breath.   Cardiovascular:  Negative for chest pain and palpitations.  Gastrointestinal: Negative.   Genitourinary: Negative.   Neurological: Negative.   Psychiatric/Behavioral: Negative.        Objective:    Physical Exam Constitutional:      Appearance: Normal appearance. She is obese.  HENT:     Head: Normocephalic.     Right Ear: Tympanic membrane normal.     Left Ear: Tympanic membrane normal.     Nose: Nose normal.     Mouth/Throat:     Mouth: Mucous membranes are moist.     Pharynx: Oropharynx is clear.  Eyes:     Extraocular Movements: Extraocular movements intact.     Conjunctiva/sclera:  Conjunctivae normal.     Pupils: Pupils are equal, round, and reactive to light.  Cardiovascular:     Rate and Rhythm: Normal rate and regular rhythm.     Pulses: Normal pulses.     Heart sounds: Normal heart sounds.  Pulmonary:     Effort: Pulmonary effort is normal. No respiratory distress.     Breath sounds: Normal breath sounds. No rhonchi.  Abdominal:     General: Bowel sounds are normal.     Palpations: Abdomen is soft. There is no mass.     Tenderness: There is no abdominal tenderness.     Hernia: No hernia is present.  Musculoskeletal:        General: Normal range of motion.     Cervical back: Neck supple. No tenderness.  Skin:    General: Skin is warm.     Capillary Refill: Capillary refill takes less than 2 seconds.  Neurological:     General: No focal deficit present.     Mental Status: She is alert and oriented to person, place, and time. Mental status is at baseline.  Psychiatric:        Mood and Affect: Mood normal.        Behavior: Behavior normal.        Thought Content: Thought content normal.        Judgment: Judgment normal.     BP (!) 150/73   Pulse 68   Temp (!) 97.3 F (36.3 C) (Temporal)   Ht _0  (1.575 m)   Wt 160 lb 9.6 oz (72.8 kg)   SpO2 98%   BMI 29.37 kg/m  Wt Readings from Last 3 Encounters:  08/05/22  160 lb 9.6 oz (72.8 kg)  07/27/22 158 lb (71.7 kg)  07/09/22 158 lb 4.8 oz (71.8 kg)     Health Maintenance  Topic Date Due   Zoster Vaccines- Shingrix (1 of 2) 09/01/2022 (Originally 03/02/1995)   COLONOSCOPY (Pts 45-5yr Insurance coverage will need to be confirmed)  05/10/2023   Medicare Annual Wellness (AWV)  07/28/2023   Pneumonia Vaccine 77 Years old  Completed   INFLUENZA VACCINE  Completed   DEXA SCAN  Completed   Hepatitis C Screening  Completed   HPV VACCINES  Aged Out   DTaP/Tdap/Td  Discontinued   COVID-19 Vaccine  Discontinued    There are no preventive care reminders to display for this patient.  Lab Results  Component Value Date   TSH 2.24 02/16/2022   Lab Results  Component Value Date   WBC 4.1 02/16/2022   HGB 13.5 02/16/2022   HCT 39.5 02/16/2022   MCV 89.8 02/16/2022   PLT 191 02/16/2022   Lab Results  Component Value Date   NA 142 02/16/2022   K 4.0 02/16/2022   CO2 26 02/16/2022   GLUCOSE 97 02/16/2022   BUN 17 02/16/2022   CREATININE 0.88 02/16/2022   BILITOT 0.7 02/16/2022   AST 21 02/16/2022   ALT 16 02/16/2022   PROT 6.7 02/16/2022   CALCIUM 9.0 02/16/2022   ANIONGAP 8 05/11/2021   EGFR 68 02/16/2022   Lab Results  Component Value Date   CHOL 108 02/16/2022   Lab Results  Component Value Date   HDL 41 (L) 02/16/2022   Lab Results  Component Value Date   LDLCALC 46 02/16/2022   Lab Results  Component Value Date   TRIG 128 02/16/2022   Lab Results  Component Value Date   CHOLHDL 2.6 02/16/2022  No results found for: "HGBA1C"    Assessment & Plan:   Problem List Items Addressed This Visit       Cardiovascular and Mediastinum   Essential hypertension - Primary    Patient BP  Vitals:   08/05/22 1552 08/05/22 1556  BP: (!) 145/59 (!) 150/73    in the office today. Encourage patient to check blood pressure at home and bring readings to the next visit. Encourage patient to consume low-salt heart healthy diet and  follow regular activity schedule. Continue valsartan-HCTZ daily         No orders of the defined types were placed in this encounter.    Follow-up: No follow-ups on file.    Theresia Lo, NP

## 2022-08-09 ENCOUNTER — Encounter: Payer: Self-pay | Admitting: Nurse Practitioner

## 2022-08-09 NOTE — Assessment & Plan Note (Signed)
Patient BP  Vitals:   08/05/22 1552 08/05/22 1556  BP: (!) 145/59 (!) 150/73    in the office today. Encourage patient to check blood pressure at home and bring readings to the next visit. Encourage patient to consume low-salt heart healthy diet and follow regular activity schedule. Continue valsartan-HCTZ daily

## 2022-08-16 ENCOUNTER — Encounter: Payer: PPO | Admitting: Internal Medicine

## 2022-09-06 ENCOUNTER — Ambulatory Visit (INDEPENDENT_AMBULATORY_CARE_PROVIDER_SITE_OTHER): Payer: PPO | Admitting: Internal Medicine

## 2022-09-06 ENCOUNTER — Encounter: Payer: Self-pay | Admitting: Internal Medicine

## 2022-09-06 VITALS — BP 178/88 | HR 69 | Ht 62.0 in | Wt 160.0 lb

## 2022-09-06 DIAGNOSIS — N814 Uterovaginal prolapse, unspecified: Secondary | ICD-10-CM | POA: Diagnosis not present

## 2022-09-06 DIAGNOSIS — M5432 Sciatica, left side: Secondary | ICD-10-CM

## 2022-09-06 DIAGNOSIS — I872 Venous insufficiency (chronic) (peripheral): Secondary | ICD-10-CM | POA: Diagnosis not present

## 2022-09-06 DIAGNOSIS — I10 Essential (primary) hypertension: Secondary | ICD-10-CM

## 2022-09-06 DIAGNOSIS — E7849 Other hyperlipidemia: Secondary | ICD-10-CM | POA: Diagnosis not present

## 2022-09-06 MED ORDER — AMLODIPINE BESYLATE 2.5 MG PO TABS: 2.5000 mg | ORAL_TABLET | Freq: Every day | ORAL | 2 refills | Status: DC

## 2022-09-06 NOTE — Assessment & Plan Note (Signed)
Stable at the present time. 

## 2022-09-06 NOTE — Assessment & Plan Note (Signed)

## 2022-09-06 NOTE — Assessment & Plan Note (Signed)
Refer to GYN patient already had surgery for that.

## 2022-09-06 NOTE — Progress Notes (Signed)
Established Patient Office Visit  Subjective:  Patient ID: Amanda Cruz, female    DOB: 02-10-1946  Age: 77 y.o. MRN: [id number]  CC:  Chief Complaint  Patient presents with   Follow-up    HPI  [name] presents for check up  Past Medical History:  Diagnosis Date   COVID-19 2021   GERD (gastroesophageal reflux disease)    Hypertension    Wears dentures    full upper and lower    Past Surgical History:  Procedure Laterality Date   ANTERIOR AND POSTERIOR REPAIR N/A 05/11/2021   Procedure: ANTERIOR (CYSTOCELE) AND POSTERIOR REPAIR (RECTOCELE);  Surgeon: Harlin Heys, MD;  Location: [facility];  Service: Gynecology;  Laterality: N/A;   CATARACT EXTRACTION W/PHACO Left 08/22/2019   Procedure: CATARACT EXTRACTION PHACO AND INTRAOCULAR LENS PLACEMENT (IOC) LEFT  9.98  01:10.4  14.2%;  Surgeon: Leandrew Koyanagi, MD;  Location: [place];  Service: Ophthalmology;  Laterality: Left;   CATARACT EXTRACTION W/PHACO Right 09/19/2019   Procedure: CATARACT EXTRACTION PHACO AND INTRAOCULAR LENS PLACEMENT (IOC) RIGHT PANOPTIX LENS 7.44  00:52.4  14.3%;  Surgeon: Leandrew Koyanagi, MD;  Location: [place];  Service: Ophthalmology;  Laterality: Right;   COLONOSCOPY WITH PROPOFOL N/A 05/09/2018   Procedure: COLONOSCOPY WITH PROPOFOL;  Surgeon: [name], MD;  Location: [facility];  Service: Endoscopy;  Laterality: N/A;   ESOPHAGOGASTRODUODENOSCOPY (EGD) WITH PROPOFOL N/A 02/05/2020   Procedure: ESOPHAGOGASTRODUODENOSCOPY (EGD) WITH PROPOFOL;  Surgeon: [name], MD;  Location: [facility];  Service: Endoscopy;  Laterality: N/A;   EYE SURGERY     FOOT SURGERY Bilateral    KNEE ARTHROSCOPY Right    PUBOVAGINAL SLING N/A 05/11/2021   Procedure: PUBO-VAGINAL SLING (TOT);  Surgeon: Harlin Heys, MD;  Location: [facility];  Service: Gynecology;  Laterality: N/A;   TEAR DUCT PROBING  03/10/2015   TUBAL LIGATION     VAGINAL HYSTERECTOMY N/A  05/11/2021   Procedure: HYSTERECTOMY VAGINAL;  Surgeon: Harlin Heys, MD;  Location: [facility];  Service: Gynecology;  Laterality: N/A;    Family History  Problem Relation Age of Onset   Stroke Mother    Heart failure Mother    Thyroid disease Paternal Grandfather     Social History   Socioeconomic History   Marital status: Married    Spouse name: Not on file   Number of children: Not on file   Years of education: Not on file   Highest education level: Not on file  Occupational History   Not on file  Tobacco Use   Smoking status: Never   Smokeless tobacco: Never  Vaping Use   Vaping Use: Never used  Substance and Sexual Activity   Alcohol use: Not Currently    Alcohol/week: 1.0 standard drink of alcohol    Types: 1 Glasses of wine per week   Drug use: No   Sexual activity: Not Currently    Birth control/protection: Post-menopausal, Surgical    Comment: hysterectomy  Other Topics Concern   Not on file  Social History Narrative   Not on file   Social Determinants of Health   Financial Resource Strain: Low Risk  (07/27/2022)   Overall Financial Resource Strain (CARDIA)    Difficulty of Paying Living Expenses: Not hard at all  Food Insecurity: No Food Insecurity (07/27/2022)   Hunger Vital Sign    Worried About Running Out of Food in the Last Year: Never true    [place] in the Last  Year: Never true  Transportation Needs: No Transportation Needs (07/27/2022)   PRAPARE - Hydrologist (Medical): No    Lack of Transportation (Non-Medical): No  Physical Activity: Inactive (07/27/2022)   Exercise Vital Sign    Days of Exercise per Week: 0 days    Minutes of Exercise per Session: 0 min  Stress: No Stress Concern Present (07/27/2022)   [place]    Feeling of Stress : Not at all  Social Connections: Moderately Isolated (07/27/2022)   Social Connection and  Isolation Panel [NHANES]    Frequency of Communication with Friends and Family: More than three times a week    Frequency of Social Gatherings with Friends and Family: Twice a week    Attends Religious Services: Never    Marine scientist or Organizations: No    Attends Archivist Meetings: Never    Marital Status: Married  Human resources officer Violence: Not At Risk (07/27/2022)   Humiliation, Afraid, Rape, and Kick questionnaire    Fear of Current or Ex-Partner: No    Emotionally Abused: No    Physically Abused: No    Sexually Abused: No     Current Outpatient Medications:    albuterol (VENTOLIN HFA) 108 (90 Base) MCG/ACT inhaler, Inhale 2 puffs into the lungs every 6 (six) hours as needed for wheezing or shortness of breath., Disp: 8 g, Rfl: 0   amLODipine (NORVASC) 2.5 MG tablet, Take 1 tablet (2.5 mg total) by mouth daily., Disp: 90 tablet, Rfl: 2   atorvastatin (LIPITOR) 40 MG tablet, Take 1 tablet by mouth once daily, Disp: 90 tablet, Rfl: 1   Biotin w/ Vitamins C & E (HAIR/SKIN/NAILS PO), Take 1 tablet by mouth daily., Disp: , Rfl:    Cholecalciferol (VITAMIN D3) 25 MCG (1000 UT) CAPS, Take 1,000 Units by mouth daily., Disp: , Rfl:    fluticasone-salmeterol (ADVAIR) 100-50 MCG/ACT AEPB, Inhale 1 puff into the lungs 2 (two) times daily., Disp: 1 each, Rfl: 3   Glucosamine 500 MG CAPS, Take by mouth., Disp: , Rfl:    IRON-VITAMIN C PO, Take 1 tablet by mouth daily., Disp: , Rfl:    metoprolol tartrate (LOPRESSOR) 50 MG tablet, Take 1 tablet by mouth once daily, Disp: 90 tablet, Rfl: 0   Propylene Glycol (SYSTANE BALANCE) 0.6 % SOLN, Place 1 drop into both eyes daily as needed (dry eyes)., Disp: , Rfl:    valsartan-hydrochlorothiazide (DIOVAN-HCT) 80-12.5 MG tablet, Take 2 tablets by mouth daily., Disp: 90 tablet, Rfl: 3   No Known Allergies  ROS Review of Systems  Constitutional: Negative.   HENT: Negative.    Eyes: Negative.   Respiratory: Negative.     Cardiovascular: Negative.   Gastrointestinal: Negative.   Endocrine: Negative.   Genitourinary: Negative.   Musculoskeletal: Negative.   Skin: Negative.   Allergic/Immunologic: Negative.   Neurological: Negative.   Hematological: Negative.   Psychiatric/Behavioral: Negative.    All other systems reviewed and are negative.     Objective:    Physical Exam Vitals reviewed.  Constitutional:      Appearance: Normal appearance.  HENT:     Mouth/Throat:     Mouth: Mucous membranes are moist.  Eyes:     Pupils: Pupils are equal, round, and reactive to light.  Neck:     Vascular: No carotid bruit.  Cardiovascular:     Rate and Rhythm: Normal rate and regular rhythm.  Pulses: Normal pulses.     Heart sounds: Normal heart sounds.  Pulmonary:     Effort: Pulmonary effort is normal.     Breath sounds: Normal breath sounds.  Abdominal:     General: Bowel sounds are normal.     Palpations: Abdomen is soft. There is no hepatomegaly, splenomegaly or mass.     Tenderness: There is no abdominal tenderness.     Hernia: No hernia is present.  Musculoskeletal:        General: No tenderness.     Cervical back: Neck supple.     Right lower leg: No edema.     Left lower leg: No edema.  Skin:    Findings: No rash.  Neurological:     Mental Status: She is alert and oriented to person, place, and time.     Motor: No weakness.  Psychiatric:        Mood and Affect: Mood and affect normal.        Behavior: Behavior normal.     BP (!) 178/88 Comment: just took medication  Pulse 69   Ht '5\' 2"'$  (1.575 m)   Wt 160 lb (72.6 kg)   SpO2 98%   BMI 29.26 kg/m  Wt Readings from Last 3 Encounters:  09/06/22 160 lb (72.6 kg)  08/05/22 160 lb 9.6 oz (72.8 kg)  07/27/22 158 lb (71.7 kg)     Health Maintenance Due  Topic Date Due   Zoster Vaccines- Shingrix (1 of 2) Never done    There are no preventive care reminders to display for this patient.  Lab Results  Component Value Date    TSH 2.24 02/16/2022   Lab Results  Component Value Date   WBC 4.1 02/16/2022   HGB 13.5 02/16/2022   HCT 39.5 02/16/2022   MCV 89.8 02/16/2022   PLT 191 02/16/2022   Lab Results  Component Value Date   NA 142 02/16/2022   K 4.0 02/16/2022   CO2 26 02/16/2022   GLUCOSE 97 02/16/2022   BUN 17 02/16/2022   CREATININE 0.88 02/16/2022   BILITOT 0.7 02/16/2022   AST 21 02/16/2022   ALT 16 02/16/2022   PROT 6.7 02/16/2022   CALCIUM 9.0 02/16/2022   ANIONGAP 8 05/11/2021   EGFR 68 02/16/2022   Lab Results  Component Value Date   CHOL 108 02/16/2022   Lab Results  Component Value Date   HDL 41 (L) 02/16/2022   Lab Results  Component Value Date   LDLCALC 46 02/16/2022   Lab Results  Component Value Date   TRIG 128 02/16/2022   Lab Results  Component Value Date   CHOLHDL 2.6 02/16/2022   No results found for: "HGBA1C"    Assessment & Plan:   Problem List Items Addressed This Visit       Cardiovascular and Mediastinum   Essential hypertension - Primary     Patient denies any chest pain or shortness of breath there is no history of palpitation or paroxysmal nocturnal dyspnea   patient was advised to follow low-salt low-cholesterol diet    ideally I want to keep systolic blood pressure below 130 mmHg, patient was asked to check blood pressure one times a week and give me a report on that.  Patient will be follow-up in 3 months  or earlier as needed, patient will call me back for any change in the cardiovascular symptoms Patient was advised to buy a book from local bookstore concerning blood pressure and read several  chapters  every day.  This will be supplemented by some of the material we will give him from the office.  Patient should also utilize other resources like YouTube and Internet to learn more about the blood pressure and the diet.      Relevant Medications   amLODipine (NORVASC) 2.5 MG tablet   Chronic venous insufficiency   Relevant Medications    amLODipine (NORVASC) 2.5 MG tablet     Nervous and Auditory   Sciatica of left side    Stable at the present time        Genitourinary   Cystocele with uterine prolapse    Refer to GYN patient already had surgery for that.        Other   Hyperlipidemia    Hypercholesterolemia  I advised the patient to follow Mediterranean diet This diet is rich in fruits vegetables and whole grain, and This diet is also rich in fish and lean meat Patient should also eat a handful of almonds or walnuts daily Recent heart study indicated that average follow-up on this kind of diet reduces the cardiovascular mortality by 50 to 70%==      Relevant Medications   amLODipine (NORVASC) 2.5 MG tablet    Meds ordered this encounter  Medications   amLODipine (NORVASC) 2.5 MG tablet    Sig: Take 1 tablet (2.5 mg total) by mouth daily.    Dispense:  90 tablet    Refill:  2    Follow-up: No follow-ups on file.    [name], MD

## 2022-09-06 NOTE — Assessment & Plan Note (Signed)
Hypercholesterolemia  I advised the patient to follow Mediterranean diet This diet is rich in fruits vegetables and whole grain, and This diet is also rich in fish and lean meat Patient should also eat a handful of almonds or walnuts daily Recent heart study indicated that average follow-up on this kind of diet reduces the cardiovascular mortality by 50 to 70%== 

## 2023-01-27 ENCOUNTER — Other Ambulatory Visit: Payer: Self-pay | Admitting: Nurse Practitioner

## 2023-01-31 NOTE — Telephone Encounter (Signed)
Do you want me to refill pt has not been in office since 2023.

## 2023-02-01 NOTE — Telephone Encounter (Signed)
Please advice pt to schedule an appointment. Will refill this time.

## 2023-02-03 NOTE — Telephone Encounter (Signed)
LVM to call back to schedule appt to continue to get refills 

## 2023-05-20 ENCOUNTER — Other Ambulatory Visit: Payer: Self-pay | Admitting: Nurse Practitioner

## 2023-05-25 NOTE — Telephone Encounter (Signed)
FYI

## 2023-05-25 NOTE — Telephone Encounter (Signed)
Please inform pt she need f/u before refill.

## 2023-06-09 NOTE — Telephone Encounter (Signed)
Left message to call the office back.

## 2023-06-10 NOTE — Telephone Encounter (Signed)
Noted  

## 2023-11-06 ENCOUNTER — Other Ambulatory Visit: Payer: Self-pay

## 2023-11-06 ENCOUNTER — Inpatient Hospital Stay
Admission: EM | Admit: 2023-11-06 | Discharge: 2023-11-12 | DRG: 871 | Disposition: A | Discharge status: Home Health | Attending: Internal Medicine | Admitting: Internal Medicine

## 2023-11-06 DIAGNOSIS — Z5986 Financial insecurity: Secondary | ICD-10-CM

## 2023-11-06 DIAGNOSIS — E785 Hyperlipidemia, unspecified: Secondary | ICD-10-CM | POA: Insufficient documentation

## 2023-11-06 DIAGNOSIS — A4151 Sepsis due to Escherichia coli [E. coli]: Secondary | ICD-10-CM | POA: Diagnosis not present

## 2023-11-06 DIAGNOSIS — Z90710 Acquired absence of both cervix and uterus: Secondary | ICD-10-CM

## 2023-11-06 DIAGNOSIS — I700 Atherosclerosis of aorta: Secondary | ICD-10-CM | POA: Diagnosis present

## 2023-11-06 DIAGNOSIS — I4719 Other supraventricular tachycardia: Secondary | ICD-10-CM | POA: Diagnosis present

## 2023-11-06 DIAGNOSIS — R197 Diarrhea, unspecified: Secondary | ICD-10-CM | POA: Diagnosis present

## 2023-11-06 DIAGNOSIS — R570 Cardiogenic shock: Secondary | ICD-10-CM | POA: Insufficient documentation

## 2023-11-06 DIAGNOSIS — I2489 Other forms of acute ischemic heart disease: Secondary | ICD-10-CM | POA: Diagnosis present

## 2023-11-06 DIAGNOSIS — I739 Peripheral vascular disease, unspecified: Secondary | ICD-10-CM | POA: Diagnosis present

## 2023-11-06 DIAGNOSIS — D649 Anemia, unspecified: Secondary | ICD-10-CM | POA: Diagnosis present

## 2023-11-06 DIAGNOSIS — Z1152 Encounter for screening for COVID-19: Secondary | ICD-10-CM

## 2023-11-06 DIAGNOSIS — R6521 Severe sepsis with septic shock: Secondary | ICD-10-CM | POA: Diagnosis present

## 2023-11-06 DIAGNOSIS — I110 Hypertensive heart disease with heart failure: Secondary | ICD-10-CM | POA: Diagnosis present

## 2023-11-06 DIAGNOSIS — I10 Essential (primary) hypertension: Secondary | ICD-10-CM | POA: Diagnosis present

## 2023-11-06 DIAGNOSIS — Z860100 Personal history of colon polyps, unspecified: Secondary | ICD-10-CM

## 2023-11-06 DIAGNOSIS — A419 Sepsis, unspecified organism: Secondary | ICD-10-CM | POA: Diagnosis present

## 2023-11-06 DIAGNOSIS — I214 Non-ST elevation (NSTEMI) myocardial infarction: Principal | ICD-10-CM

## 2023-11-06 DIAGNOSIS — I4891 Unspecified atrial fibrillation: Secondary | ICD-10-CM | POA: Diagnosis present

## 2023-11-06 DIAGNOSIS — E878 Other disorders of electrolyte and fluid balance, not elsewhere classified: Secondary | ICD-10-CM | POA: Diagnosis present

## 2023-11-06 DIAGNOSIS — Z7982 Long term (current) use of aspirin: Secondary | ICD-10-CM

## 2023-11-06 DIAGNOSIS — I872 Venous insufficiency (chronic) (peripheral): Secondary | ICD-10-CM | POA: Diagnosis present

## 2023-11-06 DIAGNOSIS — D696 Thrombocytopenia, unspecified: Secondary | ICD-10-CM | POA: Diagnosis present

## 2023-11-06 DIAGNOSIS — R579 Shock, unspecified: Secondary | ICD-10-CM | POA: Diagnosis not present

## 2023-11-06 DIAGNOSIS — E876 Hypokalemia: Secondary | ICD-10-CM

## 2023-11-06 DIAGNOSIS — I471 Supraventricular tachycardia, unspecified: Secondary | ICD-10-CM

## 2023-11-06 DIAGNOSIS — J45909 Unspecified asthma, uncomplicated: Secondary | ICD-10-CM | POA: Diagnosis present

## 2023-11-06 DIAGNOSIS — E871 Hypo-osmolality and hyponatremia: Secondary | ICD-10-CM | POA: Insufficient documentation

## 2023-11-06 DIAGNOSIS — J45901 Unspecified asthma with (acute) exacerbation: Secondary | ICD-10-CM | POA: Diagnosis present

## 2023-11-06 DIAGNOSIS — Z8616 Personal history of COVID-19: Secondary | ICD-10-CM

## 2023-11-06 DIAGNOSIS — Z823 Family history of stroke: Secondary | ICD-10-CM

## 2023-11-06 DIAGNOSIS — K219 Gastro-esophageal reflux disease without esophagitis: Secondary | ICD-10-CM | POA: Diagnosis present

## 2023-11-06 DIAGNOSIS — Z79899 Other long term (current) drug therapy: Secondary | ICD-10-CM

## 2023-11-06 DIAGNOSIS — R112 Nausea with vomiting, unspecified: Secondary | ICD-10-CM | POA: Diagnosis present

## 2023-11-06 DIAGNOSIS — R911 Solitary pulmonary nodule: Secondary | ICD-10-CM | POA: Insufficient documentation

## 2023-11-06 DIAGNOSIS — N390 Urinary tract infection, site not specified: Secondary | ICD-10-CM | POA: Diagnosis present

## 2023-11-06 DIAGNOSIS — Z8249 Family history of ischemic heart disease and other diseases of the circulatory system: Secondary | ICD-10-CM

## 2023-11-06 DIAGNOSIS — Z7951 Long term (current) use of inhaled steroids: Secondary | ICD-10-CM

## 2023-11-06 DIAGNOSIS — I340 Nonrheumatic mitral (valve) insufficiency: Secondary | ICD-10-CM | POA: Diagnosis present

## 2023-11-06 DIAGNOSIS — J9601 Acute respiratory failure with hypoxia: Secondary | ICD-10-CM | POA: Diagnosis present

## 2023-11-06 DIAGNOSIS — I5031 Acute diastolic (congestive) heart failure: Secondary | ICD-10-CM | POA: Diagnosis present

## 2023-11-06 DIAGNOSIS — G9341 Metabolic encephalopathy: Secondary | ICD-10-CM | POA: Diagnosis present

## 2023-11-06 MED ORDER — LACTATED RINGERS IV BOLUS
1000.0000 mL | Freq: Once | INTRAVENOUS | Status: AC
  Administered 2023-11-07: 1000 mL via INTRAVENOUS

## 2023-11-06 NOTE — ED Provider Notes (Signed)
   [facility] Provider Note    Event Date/Time   First MD Initiated Contact with Patient 11/06/23 2346     (approximate)   History   No chief complaint on file.   HPI  Amanda Cruz is a 78 y.o. female who presents to the ED for evaluation of No chief complaint on file.   I reviewed PCP visit from greater than 1 year ago.  Seen for routine checkup.  History of GERD and HTN.  Venous insufficiency, sciatica, cystocele and uterine prolapse.  Patient presents to the ED via EMS from home for evaluation of shortness of breath, fever, intermittent confusion in the setting of a recently diagnosed UTI.  Reportedly went to an urgent care this past Friday, 2 days ago, diagnosed with UTI started on antibiotics.  She does not know the name of the medicine that she is taking put, but reports compliance to it.  Reports developing shortness of breath and feeling unwell despite this medication.  EMS found her febrile, tachycardic and tachypneic.  Provided IV Tylenol with EMS.  Physical Exam   Triage Vital Signs: ED Triage Vitals  Encounter Vitals Group     BP      Systolic BP Percentile      Diastolic BP Percentile      Pulse      Resp      Temp      Temp src      SpO2      Weight      Height      Head Circumference      Peak Flow      Pain Score      Pain Loc      Pain Education      Exclude from Growth Chart     Most recent vital signs: There were no vitals filed for this visit.  General: Awake, no distress.  Warm to the touch, fidgeting in bed CV:  Good peripheral perfusion.  Tachycardic and regular Resp:  Normal effort.  No wheezing Abd:  No distention.  Suprapubic tenderness but no peritoneal features MSK:  No deformity noted.  Neuro:  No focal deficits appreciated. Other:     ED Results / Procedures / Treatments   Labs (all labs ordered are listed, but only abnormal results are displayed) Labs Reviewed - No data to  display  EKG ***  RADIOLOGY ***  Official radiology report(s): No results found.  PROCEDURES and INTERVENTIONS:  Procedures  Medications - No data to display   IMPRESSION / MDM / ASSESSMENT AND PLAN / ED COURSE  I reviewed the triage vital signs and the nursing notes.  Differential diagnosis includes, but is not limited to, ***  {Patient presents with symptoms of an acute illness or injury that is potentially life-threatening.}      FINAL CLINICAL IMPRESSION(S) / ED DIAGNOSES   Final diagnoses:  None     Rx / DC Orders   ED Discharge Orders     None        Note:  This document was prepared using Dragon voice recognition software and may include unintentional dictation errors.

## 2023-11-06 NOTE — ED Triage Notes (Signed)
 Pt BIB EMS for evaluation of altered mental status.  Pt currently being treated for UTI.  Pt is A&Ox4 during triage.

## 2023-11-07 ENCOUNTER — Emergency Department

## 2023-11-07 ENCOUNTER — Inpatient Hospital Stay (HOSPITAL_COMMUNITY)
Admit: 2023-11-07 | Discharge: 2023-11-07 | Disposition: A | Discharge status: Home / Self Care | Attending: Family Medicine

## 2023-11-07 ENCOUNTER — Inpatient Hospital Stay

## 2023-11-07 ENCOUNTER — Encounter: Payer: Self-pay | Admitting: Family Medicine

## 2023-11-07 DIAGNOSIS — R579 Shock, unspecified: Secondary | ICD-10-CM

## 2023-11-07 DIAGNOSIS — J45901 Unspecified asthma with (acute) exacerbation: Secondary | ICD-10-CM | POA: Diagnosis present

## 2023-11-07 DIAGNOSIS — J45909 Unspecified asthma, uncomplicated: Secondary | ICD-10-CM | POA: Diagnosis present

## 2023-11-07 DIAGNOSIS — Z8616 Personal history of COVID-19: Secondary | ICD-10-CM | POA: Diagnosis not present

## 2023-11-07 DIAGNOSIS — I110 Hypertensive heart disease with heart failure: Secondary | ICD-10-CM | POA: Diagnosis present

## 2023-11-07 DIAGNOSIS — E876 Hypokalemia: Secondary | ICD-10-CM

## 2023-11-07 DIAGNOSIS — A419 Sepsis, unspecified organism: Secondary | ICD-10-CM | POA: Diagnosis not present

## 2023-11-07 DIAGNOSIS — I739 Peripheral vascular disease, unspecified: Secondary | ICD-10-CM | POA: Diagnosis present

## 2023-11-07 DIAGNOSIS — I5031 Acute diastolic (congestive) heart failure: Secondary | ICD-10-CM | POA: Diagnosis present

## 2023-11-07 DIAGNOSIS — I471 Supraventricular tachycardia, unspecified: Secondary | ICD-10-CM | POA: Diagnosis not present

## 2023-11-07 DIAGNOSIS — I2489 Other forms of acute ischemic heart disease: Secondary | ICD-10-CM | POA: Diagnosis present

## 2023-11-07 DIAGNOSIS — Z7951 Long term (current) use of inhaled steroids: Secondary | ICD-10-CM | POA: Diagnosis not present

## 2023-11-07 DIAGNOSIS — Z1152 Encounter for screening for COVID-19: Secondary | ICD-10-CM | POA: Diagnosis not present

## 2023-11-07 DIAGNOSIS — R6521 Severe sepsis with septic shock: Secondary | ICD-10-CM | POA: Diagnosis present

## 2023-11-07 DIAGNOSIS — K219 Gastro-esophageal reflux disease without esophagitis: Secondary | ICD-10-CM | POA: Diagnosis present

## 2023-11-07 DIAGNOSIS — G9341 Metabolic encephalopathy: Secondary | ICD-10-CM

## 2023-11-07 DIAGNOSIS — I214 Non-ST elevation (NSTEMI) myocardial infarction: Secondary | ICD-10-CM | POA: Diagnosis not present

## 2023-11-07 DIAGNOSIS — R570 Cardiogenic shock: Secondary | ICD-10-CM | POA: Diagnosis not present

## 2023-11-07 DIAGNOSIS — I10 Essential (primary) hypertension: Secondary | ICD-10-CM

## 2023-11-07 DIAGNOSIS — I4891 Unspecified atrial fibrillation: Secondary | ICD-10-CM | POA: Diagnosis present

## 2023-11-07 DIAGNOSIS — E785 Hyperlipidemia, unspecified: Secondary | ICD-10-CM | POA: Diagnosis present

## 2023-11-07 DIAGNOSIS — I340 Nonrheumatic mitral (valve) insufficiency: Secondary | ICD-10-CM | POA: Diagnosis present

## 2023-11-07 DIAGNOSIS — E871 Hypo-osmolality and hyponatremia: Secondary | ICD-10-CM | POA: Diagnosis present

## 2023-11-07 DIAGNOSIS — N390 Urinary tract infection, site not specified: Secondary | ICD-10-CM | POA: Diagnosis present

## 2023-11-07 DIAGNOSIS — J9601 Acute respiratory failure with hypoxia: Secondary | ICD-10-CM | POA: Diagnosis present

## 2023-11-07 DIAGNOSIS — I4719 Other supraventricular tachycardia: Secondary | ICD-10-CM | POA: Diagnosis present

## 2023-11-07 DIAGNOSIS — A4151 Sepsis due to Escherichia coli [E. coli]: Secondary | ICD-10-CM | POA: Diagnosis present

## 2023-11-07 DIAGNOSIS — D696 Thrombocytopenia, unspecified: Secondary | ICD-10-CM | POA: Diagnosis present

## 2023-11-07 DIAGNOSIS — D649 Anemia, unspecified: Secondary | ICD-10-CM | POA: Diagnosis present

## 2023-11-07 HISTORY — DX: Sepsis, unspecified organism: A41.9

## 2023-11-07 LAB — BLOOD CULTURE ID PANEL (REFLEXED) - BCID2

## 2023-11-07 LAB — ECHOCARDIOGRAM COMPLETE
AR max vel: 2.68 cm2
AV Area VTI: 2.89 cm2
AV Area mean vel: 2.43 cm2
AV Mean grad: 2 mmHg
AV Peak grad: 3.2 mmHg
Ao pk vel: 0.9 m/s
Area-P 1/2: 4.36 cm2
Calc EF: 59.3 %
Height: 63 in
S' Lateral: 2.6 cm
Single Plane A2C EF: 64.1 %
Single Plane A4C EF: 52.4 %
Weight: 2448 [oz_av]

## 2023-11-07 LAB — PROTIME-INR
INR: 1 (ref 0.8–1.2)
INR: 1.2 (ref 0.8–1.2)
Prothrombin Time: 13.9 s (ref 11.4–15.2)
Prothrombin Time: 15.6 s — ABNORMAL HIGH (ref 11.4–15.2)

## 2023-11-07 LAB — CBC WITH DIFFERENTIAL/PLATELET
Abs Immature Granulocytes: 0.02 10*3/uL (ref 0.00–0.07)
Abs Immature Granulocytes: UNDETERMINED 10*3/uL (ref 0.00–0.07)
Basophils Absolute: 0 10*3/uL (ref 0.0–0.1)
Basophils Absolute: UNDETERMINED 10*3/uL (ref 0.0–0.1)
Basophils Relative: 0 %
Basophils Relative: UNDETERMINED %
Eosinophils Absolute: 0 10*3/uL (ref 0.0–0.5)
Eosinophils Absolute: UNDETERMINED 10*3/uL (ref 0.0–0.5)
Eosinophils Relative: 0 %
Eosinophils Relative: UNDETERMINED %
HCT: 30.9 % — ABNORMAL LOW (ref 36.0–46.0)
HCT: UNDETERMINED % (ref 36.0–46.0)
Hemoglobin: 11.4 g/dL — ABNORMAL LOW (ref 12.0–15.0)
Hemoglobin: UNDETERMINED g/dL (ref 12.0–15.0)
Immature Granulocytes: 1 %
Immature Granulocytes: UNDETERMINED %
Lymphocytes Relative: 4 %
Lymphocytes Relative: UNDETERMINED %
Lymphs Abs: 0.2 10*3/uL — ABNORMAL LOW (ref 0.7–4.0)
Lymphs Abs: UNDETERMINED 10*3/uL (ref 0.7–4.0)
MCH: 30.9 pg (ref 26.0–34.0)
MCH: UNDETERMINED pg (ref 26.0–34.0)
MCHC: 36.9 g/dL — ABNORMAL HIGH (ref 30.0–36.0)
MCHC: UNDETERMINED g/dL (ref 30.0–36.0)
MCV: 83.7 fL (ref 80.0–100.0)
MCV: UNDETERMINED fL (ref 80.0–100.0)
Monocytes Absolute: 0.1 10*3/uL (ref 0.1–1.0)
Monocytes Absolute: UNDETERMINED 10*3/uL (ref 0.1–1.0)
Monocytes Relative: 2 %
Monocytes Relative: UNDETERMINED %
Neutro Abs: 3.9 10*3/uL (ref 1.7–7.7)
Neutro Abs: UNDETERMINED 10*3/uL (ref 1.7–7.7)
Neutrophils Relative %: 93 %
Neutrophils Relative %: UNDETERMINED %
Platelets: 123 10*3/uL — ABNORMAL LOW (ref 150–400)
Platelets: UNDETERMINED 10*3/uL (ref 150–400)
RBC: 3.69 MIL/uL — ABNORMAL LOW (ref 3.87–5.11)
RBC: UNDETERMINED MIL/uL (ref 3.87–5.11)
RDW: 11.4 % — ABNORMAL LOW (ref 11.5–15.5)
RDW: UNDETERMINED % (ref 11.5–15.5)
WBC: 4.2 10*3/uL (ref 4.0–10.5)
WBC: UNDETERMINED 10*3/uL (ref 4.0–10.5)
nRBC: 0 % (ref 0.0–0.2)
nRBC: UNDETERMINED % (ref 0.0–0.2)

## 2023-11-07 LAB — URINALYSIS, COMPLETE (UACMP) WITH MICROSCOPIC
Bacteria, UA: NONE SEEN
Bilirubin Urine: NEGATIVE
Glucose, UA: NEGATIVE mg/dL
Ketones, ur: NEGATIVE mg/dL
Leukocytes,Ua: NEGATIVE
Nitrite: NEGATIVE
Protein, ur: NEGATIVE mg/dL
Specific Gravity, Urine: >1.046 — ABNORMAL HIGH (ref 1.005–1.030)
pH: 5 (ref 5.0–8.0)

## 2023-11-07 LAB — COMPREHENSIVE METABOLIC PANEL
ALT: 29 U/L (ref 0–44)
ALT: 42 U/L (ref 0–44)
AST: 43 U/L — ABNORMAL HIGH (ref 15–41)
AST: 83 U/L — ABNORMAL HIGH (ref 15–41)
Albumin: 2.7 g/dL — ABNORMAL LOW (ref 3.5–5.0)
Albumin: 3.2 g/dL — ABNORMAL LOW (ref 3.5–5.0)
Alkaline Phosphatase: 62 U/L (ref 38–126)
Alkaline Phosphatase: 89 U/L (ref 38–126)
Anion gap: 11 (ref 5–15)
Anion gap: 12 (ref 5–15)
BUN: 11 mg/dL (ref 8–23)
BUN: 12 mg/dL (ref 8–23)
CO2: 22 mmol/L (ref 22–32)
CO2: 22 mmol/L (ref 22–32)
Calcium: 7.5 mg/dL — ABNORMAL LOW (ref 8.9–10.3)
Calcium: 8.1 mg/dL — ABNORMAL LOW (ref 8.9–10.3)
Chloride: 92 mmol/L — ABNORMAL LOW (ref 98–111)
Chloride: 96 mmol/L — ABNORMAL LOW (ref 98–111)
Creatinine, Ser: 0.87 mg/dL (ref 0.44–1.00)
Creatinine, Ser: 0.88 mg/dL (ref 0.44–1.00)
GFR, Estimated: >60 mL/min (ref >60)
GFR, Estimated: >60 mL/min (ref >60)
Glucose, Bld: 153 mg/dL — ABNORMAL HIGH (ref 70–99)
Glucose, Bld: 196 mg/dL — ABNORMAL HIGH (ref 70–99)
Potassium: 2.6 mmol/L — CL (ref 3.5–5.1)
Potassium: 3.3 mmol/L — ABNORMAL LOW (ref 3.5–5.1)
Sodium: 125 mmol/L — ABNORMAL LOW (ref 135–145)
Sodium: 130 mmol/L — ABNORMAL LOW (ref 135–145)
Total Bilirubin: 0.5 mg/dL (ref 0.0–1.2)
Total Bilirubin: 0.7 mg/dL (ref 0.0–1.2)
Total Protein: 5.8 g/dL — ABNORMAL LOW (ref 6.5–8.1)
Total Protein: 6.1 g/dL — ABNORMAL LOW (ref 6.5–8.1)

## 2023-11-07 LAB — BLOOD GAS, ARTERIAL
Acid-base deficit: 2.4 mmol/L — ABNORMAL HIGH (ref 0.0–2.0)
Bicarbonate: 21.6 mmol/L (ref 20.0–28.0)
O2 Saturation: 99.4 %
Patient temperature: 37
pCO2 arterial: 34 mmHg (ref 32–48)
pH, Arterial: 7.41 (ref 7.35–7.45)
pO2, Arterial: 100 mmHg (ref 83–108)

## 2023-11-07 LAB — LACTIC ACID, PLASMA
Lactic Acid, Venous: 1.3 mmol/L (ref 0.5–1.9)
Lactic Acid, Venous: 1.4 mmol/L (ref 0.5–1.9)
Lactic Acid, Venous: 1.7 mmol/L (ref 0.5–1.9)
Lactic Acid, Venous: 1.9 mmol/L (ref 0.5–1.9)

## 2023-11-07 LAB — MRSA NEXT GEN BY PCR, NASAL: MRSA by PCR Next Gen: NOT DETECTED

## 2023-11-07 LAB — BASIC METABOLIC PANEL
Anion gap: 12 (ref 5–15)
BUN: 10 mg/dL (ref 8–23)
CO2: 19 mmol/L — ABNORMAL LOW (ref 22–32)
Calcium: 7.6 mg/dL — ABNORMAL LOW (ref 8.9–10.3)
Chloride: 100 mmol/L (ref 98–111)
Creatinine, Ser: 0.71 mg/dL (ref 0.44–1.00)
GFR, Estimated: >60 mL/min (ref >60)
Glucose, Bld: 131 mg/dL — ABNORMAL HIGH (ref 70–99)
Potassium: 4.1 mmol/L (ref 3.5–5.1)
Sodium: 131 mmol/L — ABNORMAL LOW (ref 135–145)

## 2023-11-07 LAB — APTT
aPTT: 162 s (ref 24–36)
aPTT: 26 s (ref 24–36)

## 2023-11-07 LAB — CBC
HCT: 29.9 % — ABNORMAL LOW (ref 36.0–46.0)
Hemoglobin: 11 g/dL — ABNORMAL LOW (ref 12.0–15.0)
MCH: 31.2 pg (ref 26.0–34.0)
MCHC: 36.8 g/dL — ABNORMAL HIGH (ref 30.0–36.0)
MCV: 84.7 fL (ref 80.0–100.0)
Platelets: 128 10*3/uL — ABNORMAL LOW (ref 150–400)
RBC: 3.53 MIL/uL — ABNORMAL LOW (ref 3.87–5.11)
RDW: 11.8 % (ref 11.5–15.5)
WBC: 11.4 10*3/uL — ABNORMAL HIGH (ref 4.0–10.5)
nRBC: 0 % (ref 0.0–0.2)

## 2023-11-07 LAB — C DIFFICILE QUICK SCREEN W PCR REFLEX
C Diff antigen: NEGATIVE
C Diff interpretation: NOT DETECTED
C Diff toxin: NEGATIVE

## 2023-11-07 LAB — MAGNESIUM
Magnesium: 1.4 mg/dL — ABNORMAL LOW (ref 1.7–2.4)
Magnesium: 2.8 mg/dL — ABNORMAL HIGH (ref 1.7–2.4)

## 2023-11-07 LAB — RESP PANEL BY RT-PCR (RSV, FLU A&B, COVID)  RVPGX2
Influenza A by PCR: NEGATIVE
Influenza B by PCR: NEGATIVE
Resp Syncytial Virus by PCR: NEGATIVE
SARS Coronavirus 2 by RT PCR: NEGATIVE

## 2023-11-07 LAB — HEPARIN LEVEL (UNFRACTIONATED)
Heparin Unfractionated: 0.58 [IU]/mL (ref 0.30–0.70)
Heparin Unfractionated: 0.38 [IU]/mL (ref 0.30–0.70)

## 2023-11-07 LAB — TSH: TSH: 1.843 u[IU]/mL (ref 0.350–4.500)

## 2023-11-07 LAB — GLUCOSE, CAPILLARY: Glucose-Capillary: 185 mg/dL — ABNORMAL HIGH (ref 70–99)

## 2023-11-07 LAB — BRAIN NATRIURETIC PEPTIDE: B Natriuretic Peptide: 236.1 pg/mL — ABNORMAL HIGH (ref 0.0–100.0)

## 2023-11-07 LAB — PHOSPHORUS: Phosphorus: 1.8 mg/dL — ABNORMAL LOW (ref 2.5–4.6)

## 2023-11-07 LAB — OSMOLALITY, URINE: Osmolality, Ur: 336 mosm/kg (ref 300–900)

## 2023-11-07 LAB — POTASSIUM: Potassium: 4 mmol/L (ref 3.5–5.1)

## 2023-11-07 LAB — PROCALCITONIN: Procalcitonin: 2.12 ng/mL

## 2023-11-07 LAB — TROPONIN I (HIGH SENSITIVITY)
Troponin I (High Sensitivity): 3144 ng/L (ref <18)
Troponin I (High Sensitivity): 5416 ng/L (ref <18)
Troponin I (High Sensitivity): 6095 ng/L (ref <18)

## 2023-11-07 LAB — T4, FREE: Free T4: 1.31 ng/dL — ABNORMAL HIGH (ref 0.61–1.12)

## 2023-11-07 MED ORDER — IOHEXOL 350 MG/ML SOLN
75.0000 mL | Freq: Once | INTRAVENOUS | Status: AC | PRN
  Administered 2023-11-07: 75 mL via INTRAVENOUS

## 2023-11-07 MED ORDER — ACETAMINOPHEN 325 MG PO TABS
650.0000 mg | ORAL_TABLET | ORAL | Status: DC | PRN
  Administered 2023-11-08 – 2023-11-11 (×5): 650 mg via ORAL
  Filled 2023-11-07 (×6): qty 2

## 2023-11-07 MED ORDER — TRAZODONE HCL 50 MG PO TABS
25.0000 mg | ORAL_TABLET | Freq: Every evening | ORAL | Status: DC | PRN
  Administered 2023-11-09 – 2023-11-11 (×3): 25 mg via ORAL
  Filled 2023-11-07 (×3): qty 1

## 2023-11-07 MED ORDER — SODIUM CHLORIDE 0.9 % IV BOLUS
500.0000 mL | Freq: Once | INTRAVENOUS | Status: AC
Start: 2023-11-07 — End: 2023-11-07
  Administered 2023-11-07: 500 mL via INTRAVENOUS

## 2023-11-07 MED ORDER — CHLORHEXIDINE GLUCONATE CLOTH 2 % EX PADS
6.0000 | MEDICATED_PAD | Freq: Every day | CUTANEOUS | Status: DC
  Administered 2023-11-07 – 2023-11-12 (×5): 6 via TOPICAL

## 2023-11-07 MED ORDER — METOPROLOL TARTRATE 50 MG PO TABS: 50.0000 mg | ORAL_TABLET | Freq: Every day | ORAL | Status: DC

## 2023-11-07 MED ORDER — SODIUM CHLORIDE 0.9 % IV BOLUS
500.0000 mL | Freq: Once | INTRAVENOUS | Status: AC
  Administered 2023-11-07: 500 mL via INTRAVENOUS

## 2023-11-07 MED ORDER — SODIUM CHLORIDE 0.9 % IV SOLN: 1.0000 g | Freq: Two times a day (BID) | INTRAVENOUS | Status: DC

## 2023-11-07 MED ORDER — POTASSIUM CHLORIDE 10 MEQ/100ML IV SOLN
10.0000 meq | INTRAVENOUS | Status: AC
  Administered 2023-11-07 (×5): 10 meq via INTRAVENOUS
  Filled 2023-11-07 (×5): qty 100

## 2023-11-07 MED ORDER — SODIUM PHOSPHATES 45 MMOLE/15ML IV SOLN
15.0000 mmol | Freq: Once | INTRAVENOUS | Status: AC
  Administered 2023-11-07: 15 mmol via INTRAVENOUS
  Filled 2023-11-07: qty 5

## 2023-11-07 MED ORDER — SODIUM CHLORIDE 0.9 % IV SOLN
2.0000 g | Freq: Two times a day (BID) | INTRAVENOUS | Status: DC
  Administered 2023-11-07: 2 g via INTRAVENOUS
  Filled 2023-11-07: qty 12.5

## 2023-11-07 MED ORDER — ASPIRIN 81 MG PO CHEW: 324.0000 mg | CHEWABLE_TABLET | ORAL | Status: DC

## 2023-11-07 MED ORDER — METOPROLOL TARTRATE 5 MG/5ML IV SOLN
5.0000 mg | Freq: Once | INTRAVENOUS | Status: AC
  Administered 2023-11-07: 5 mg via INTRAVENOUS
  Filled 2023-11-07: qty 5

## 2023-11-07 MED ORDER — SODIUM CHLORIDE 0.9 % IV SOLN
2.0000 g | Freq: Once | INTRAVENOUS | Status: AC
  Administered 2023-11-07: 2 g via INTRAVENOUS
  Filled 2023-11-07: qty 20

## 2023-11-07 MED ORDER — FERROUS FUMARATE 324 (106 FE) MG PO TABS
1.0000 | ORAL_TABLET | Freq: Every day | ORAL | Status: DC
  Administered 2023-11-08 – 2023-11-12 (×5): 106 mg via ORAL
  Filled 2023-11-07 (×7): qty 1

## 2023-11-07 MED ORDER — BUDESONIDE 0.25 MG/2ML IN SUSP
0.2500 mg | Freq: Two times a day (BID) | RESPIRATORY_TRACT | Status: DC
  Administered 2023-11-07 – 2023-11-12 (×10): 0.25 mg via RESPIRATORY_TRACT
  Filled 2023-11-07 (×10): qty 2

## 2023-11-07 MED ORDER — SODIUM CHLORIDE 0.9 % IV SOLN
1.0000 g | Freq: Two times a day (BID) | INTRAVENOUS | Status: DC
  Filled 2023-11-07: qty 10

## 2023-11-07 MED ORDER — PANTOPRAZOLE SODIUM 40 MG IV SOLR
40.0000 mg | Freq: Two times a day (BID) | INTRAVENOUS | Status: DC
  Administered 2023-11-07 – 2023-11-12 (×11): 40 mg via INTRAVENOUS
  Filled 2023-11-07 (×11): qty 10

## 2023-11-07 MED ORDER — NITROGLYCERIN 0.4 MG SL SUBL
0.4000 mg | SUBLINGUAL_TABLET | SUBLINGUAL | Status: DC | PRN
  Administered 2023-11-09 (×3): 0.4 mg via SUBLINGUAL
  Filled 2023-11-07: qty 1

## 2023-11-07 MED ORDER — MAGNESIUM HYDROXIDE 400 MG/5ML PO SUSP: 30.0000 mL | Freq: Every day | ORAL | Status: DC | PRN

## 2023-11-07 MED ORDER — ONDANSETRON HCL 4 MG/2ML IJ SOLN
4.0000 mg | Freq: Four times a day (QID) | INTRAMUSCULAR | Status: DC | PRN
Start: 2023-11-07 — End: 2023-11-12

## 2023-11-07 MED ORDER — POTASSIUM CHLORIDE 20 MEQ PO PACK: 40.0000 meq | PACK | Freq: Once | ORAL | Status: DC

## 2023-11-07 MED ORDER — NOREPINEPHRINE 4 MG/250ML-% IV SOLN
0.0000 ug/min | INTRAVENOUS | Status: DC
  Administered 2023-11-07: 1.333 ug/min via INTRAVENOUS
  Filled 2023-11-07: qty 250

## 2023-11-07 MED ORDER — SODIUM CHLORIDE 0.9 % IV SOLN: 2.0000 g | Freq: Once | INTRAVENOUS | Status: DC

## 2023-11-07 MED ORDER — ASPIRIN 81 MG PO TBEC
81.0000 mg | DELAYED_RELEASE_TABLET | Freq: Every day | ORAL | Status: DC
  Administered 2023-11-08 – 2023-11-09 (×2): 81 mg via ORAL
  Filled 2023-11-07 (×2): qty 1

## 2023-11-07 MED ORDER — VITAMIN D 25 MCG (1000 UNIT) PO TABS
1000.0000 [IU] | ORAL_TABLET | Freq: Every day | ORAL | Status: DC
  Administered 2023-11-07 – 2023-11-12 (×6): 1000 [IU] via ORAL
  Filled 2023-11-07 (×6): qty 1

## 2023-11-07 MED ORDER — ALPRAZOLAM 0.25 MG PO TABS: 0.2500 mg | ORAL_TABLET | Freq: Two times a day (BID) | ORAL | Status: DC | PRN

## 2023-11-07 MED ORDER — SODIUM CHLORIDE 0.9 % IV SOLN: INTRAVENOUS | Status: DC

## 2023-11-07 MED ORDER — MAGNESIUM SULFATE 4 GM/100ML IV SOLN
4.0000 g | Freq: Once | INTRAVENOUS | Status: DC
  Filled 2023-11-07: qty 100

## 2023-11-07 MED ORDER — HEPARIN SODIUM (PORCINE) 5000 UNIT/ML IJ SOLN: 4000.0000 [IU] | Freq: Once | INTRAMUSCULAR | Status: DC

## 2023-11-07 MED ORDER — METOPROLOL TARTRATE 25 MG PO TABS
25.0000 mg | ORAL_TABLET | Freq: Once | ORAL | Status: AC
  Administered 2023-11-07: 25 mg via ORAL
  Filled 2023-11-07: qty 1

## 2023-11-07 MED ORDER — IRBESARTAN 75 MG PO TABS
75.0000 mg | ORAL_TABLET | Freq: Every day | ORAL | Status: DC
  Filled 2023-11-07: qty 1

## 2023-11-07 MED ORDER — HYDROCHLOROTHIAZIDE 12.5 MG PO TABS: 12.5000 mg | ORAL_TABLET | Freq: Every day | ORAL | Status: DC

## 2023-11-07 MED ORDER — POTASSIUM CHLORIDE 10 MEQ/100ML IV SOLN
10.0000 meq | INTRAVENOUS | Status: AC
Start: 2023-11-07 — End: 2023-11-07
  Administered 2023-11-07 (×3): 10 meq via INTRAVENOUS
  Filled 2023-11-07 (×3): qty 100

## 2023-11-07 MED ORDER — IRON-VITAMIN C 100-250 MG PO TABS: ORAL_TABLET | Freq: Every day | ORAL | Status: DC

## 2023-11-07 MED ORDER — ASPIRIN 81 MG PO CHEW
324.0000 mg | CHEWABLE_TABLET | Freq: Once | ORAL | Status: AC
  Administered 2023-11-07: 324 mg via ORAL
  Filled 2023-11-07: qty 4

## 2023-11-07 MED ORDER — ALBUTEROL SULFATE (2.5 MG/3ML) 0.083% IN NEBU
2.5000 mg | INHALATION_SOLUTION | RESPIRATORY_TRACT | Status: DC
  Administered 2023-11-07 (×3): 2.5 mg via RESPIRATORY_TRACT
  Filled 2023-11-07 (×3): qty 3

## 2023-11-07 MED ORDER — POLYVINYL ALCOHOL 1.4 % OP SOLN: 1.0000 [drp] | Freq: Every day | OPHTHALMIC | Status: DC | PRN

## 2023-11-07 MED ORDER — SODIUM CHLORIDE 0.9 % IV SOLN
2.0000 g | INTRAVENOUS | Status: DC
  Administered 2023-11-07 – 2023-11-09 (×3): 2 g via INTRAVENOUS
  Filled 2023-11-07 (×5): qty 20

## 2023-11-07 MED ORDER — ALBUTEROL SULFATE (2.5 MG/3ML) 0.083% IN NEBU: 2.5000 mg | INHALATION_SOLUTION | Freq: Four times a day (QID) | RESPIRATORY_TRACT | Status: DC | PRN

## 2023-11-07 MED ORDER — HEPARIN BOLUS VIA INFUSION
4000.0000 [IU] | Freq: Once | INTRAVENOUS | Status: AC
  Administered 2023-11-07: 4000 [IU] via INTRAVENOUS
  Filled 2023-11-07: qty 4000

## 2023-11-07 MED ORDER — ATORVASTATIN CALCIUM 80 MG PO TABS
80.0000 mg | ORAL_TABLET | Freq: Every day | ORAL | Status: DC
  Administered 2023-11-08 – 2023-11-11 (×4): 80 mg via ORAL
  Filled 2023-11-07 (×4): qty 1

## 2023-11-07 MED ORDER — LACTATED RINGERS IV BOLUS: 1000.0000 mL | Freq: Once | INTRAVENOUS | Status: DC

## 2023-11-07 MED ORDER — MAGNESIUM SULFATE 4 GM/100ML IV SOLN
4.0000 g | Freq: Once | INTRAVENOUS | Status: AC
  Administered 2023-11-07: 4 g via INTRAVENOUS
  Filled 2023-11-07: qty 100

## 2023-11-07 MED ORDER — VITAMIN C 500 MG PO TABS
250.0000 mg | ORAL_TABLET | Freq: Every day | ORAL | Status: DC
  Administered 2023-11-07 – 2023-11-12 (×6): 250 mg via ORAL
  Filled 2023-11-07 (×6): qty 1

## 2023-11-07 MED ORDER — VALSARTAN-HYDROCHLOROTHIAZIDE 80-12.5 MG PO TABS: 2.0000 | ORAL_TABLET | Freq: Every day | ORAL | Status: DC

## 2023-11-07 MED ORDER — HEPARIN (PORCINE) 25000 UT/250ML-% IV SOLN
950.0000 [IU]/h | INTRAVENOUS | Status: DC
  Administered 2023-11-07 – 2023-11-08 (×2): 850 [IU]/h via INTRAVENOUS
  Administered 2023-11-09: 950 [IU]/h via INTRAVENOUS
  Filled 2023-11-07 (×3): qty 250

## 2023-11-07 MED ORDER — ASPIRIN 300 MG RE SUPP: 300.0000 mg | RECTAL | Status: DC

## 2023-11-07 MED ORDER — AMLODIPINE BESYLATE 5 MG PO TABS: 2.5000 mg | ORAL_TABLET | Freq: Every day | ORAL | Status: DC

## 2023-11-07 MED ORDER — SODIUM CHLORIDE 0.9 % IV SOLN
150.0000 mL/h | INTRAVENOUS | Status: DC
Start: 2023-11-07 — End: 2023-11-07
  Administered 2023-11-07: 150 mL/h via INTRAVENOUS

## 2023-11-07 MED ORDER — ORAL CARE MOUTH RINSE: 15.0000 mL | OROMUCOSAL | Status: DC | PRN

## 2023-11-07 NOTE — Consult Note (Signed)
 Cardiology Consultation   Patient ID: [name] MRN: [id number]; DOB: June 02, 1946  Admit date: 11/06/2023 Date of Consult: 11/07/2023  PCP:  Corky Downs, MD   [facility] Providers Cardiologist:  None   {  Patient Profile:   Amanda Cruz is a 78 y.o. female with a hx of hypertension, chronic venous insufficiency, sciatica, GERD, hyperlipidemia who is being seen 11/07/2023 for the evaluation of non-STEMI at the request of Dr. Georgeann Oppenheim.  History of Present Illness:   Amanda Cruz has not been seen by cardiology in the past. She reports mother died of a MI, unsure of age. She lives with her husband, who is bedbound. She denies alcohol, drug or tobacco history.   The patient presented to the ER 11/06/2023 with altered mental status.  Patient was brought via EMS for shortness of breath, fever, intermittent confusion.  She was recently diagnosed with a UTI.  She went to an urgent care last week and started on antibiotics for UTI.  EMS found her to be febrile, tachycardic and tachypneic.  She was given IV Tylenol.  In the ER blood pressure was 109/53, pulse 128 bpm, respiratory rate 21, 96% O2, afebrile.  Labs showed hemoglobin 11.4, platelets 123, sodium 125, potassium 2.6, blood glucose 153, albumin 3.2, AST 43.  BNP 236, magnesium 1-36.  High-sensitivity troponin 5416.  She was started on IV heparin. Chest x-ray negative.  CT head unremarkable.  Chest CTA showed no PE, bronchitis. Initial EKG appears to show possible A-fib vs atrial tachycardia, heart rate of 137 bpm, QTc 579 ms. Second EKG shows sinus tach with PACs, 114 bpm nonspecific ST changes. Course complicated by shock requiring Levo. She was admitted to the ICU.  Repeat labs: hemoglobin 8.7, WBC 11.7. HS trop 6095. Patient is in Afib with Hr 90-110. She denies chest pain or SOB. Patient reports black stools a few days ago.   Past Medical History:  Diagnosis Date   COVID-19 2021   GERD (gastroesophageal reflux  disease)    Hypertension    Wears dentures    full upper and lower    Past Surgical History:  Procedure Laterality Date   ANTERIOR AND POSTERIOR REPAIR N/A 05/11/2021   Procedure: ANTERIOR (CYSTOCELE) AND POSTERIOR REPAIR (RECTOCELE);  Surgeon: Linzie Collin, MD;  Location: [facility] ORS;  Service: Gynecology;  Laterality: N/A;   CATARACT EXTRACTION W/PHACO Left 08/22/2019   Procedure: CATARACT EXTRACTION PHACO AND INTRAOCULAR LENS PLACEMENT (IOC) LEFT  9.98  01:10.4  14.2%;  Surgeon: Lockie Mola, MD;  Location: [facility];  Service: Ophthalmology;  Laterality: Left;   CATARACT EXTRACTION W/PHACO Right 09/19/2019   Procedure: CATARACT EXTRACTION PHACO AND INTRAOCULAR LENS PLACEMENT (IOC) RIGHT PANOPTIX LENS 7.44  00:52.4  14.3%;  Surgeon: Lockie Mola, MD;  Location: [facility];  Service: Ophthalmology;  Laterality: Right;   COLONOSCOPY WITH PROPOFOL N/A 05/09/2018   Procedure: COLONOSCOPY WITH PROPOFOL;  Surgeon: Midge Minium, MD;  Location: [facility] ENDOSCOPY;  Service: Endoscopy;  Laterality: N/A;   ESOPHAGOGASTRODUODENOSCOPY (EGD) WITH PROPOFOL N/A 02/05/2020   Procedure: ESOPHAGOGASTRODUODENOSCOPY (EGD) WITH PROPOFOL;  Surgeon: Midge Minium, MD;  Location: [facility] ENDOSCOPY;  Service: Endoscopy;  Laterality: N/A;   EYE SURGERY     FOOT SURGERY Bilateral    KNEE ARTHROSCOPY Right    PUBOVAGINAL SLING N/A 05/11/2021   Procedure: PUBO-VAGINAL SLING (TOT);  Surgeon: Linzie Collin, MD;  Location: [facility] ORS;  Service: Gynecology;  Laterality: N/A;   TEAR DUCT PROBING  03/10/2015  TUBAL LIGATION     VAGINAL HYSTERECTOMY N/A 05/11/2021   Procedure: HYSTERECTOMY VAGINAL;  Surgeon: Linzie Collin, MD;  Location: [facility] ORS;  Service: Gynecology;  Laterality: N/A;     Home Medications:  Prior to Admission medications   Medication Sig Start Date End Date Taking? Authorizing Provider  nitrofurantoin, macrocrystal-monohydrate, (MACROBID) 100 MG capsule Take 100  mg by mouth every 12 (twelve) hours. 11/04/23  Yes [provider]  albuterol (VENTOLIN HFA) 108 (90 Base) MCG/ACT inhaler Inhale 2 puffs into the lungs every 6 (six) hours as needed for wheezing or shortness of breath. 08/12/21   Corky Downs, MD  amLODipine (NORVASC) 2.5 MG tablet Take 1 tablet (2.5 mg total) by mouth daily. 09/06/22   Corky Downs, MD  atorvastatin (LIPITOR) 40 MG tablet Take 1 tablet by mouth once daily 07/05/22   Corky Downs, MD  Biotin w/ Vitamins C & E (HAIR/SKIN/NAILS PO) Take 1 tablet by mouth daily.    [provider]  Cholecalciferol (VITAMIN D3) 25 MCG (1000 UT) CAPS Take 1,000 Units by mouth daily.    [provider]  fluticasone-salmeterol (ADVAIR) 100-50 MCG/ACT AEPB Inhale 1 puff into the lungs 2 (two) times daily. 05/04/22   Corky Downs, MD  Glucosamine 500 MG CAPS Take by mouth.    [provider]  IRON-VITAMIN C PO Take 1 tablet by mouth daily.    [provider]  metoprolol tartrate (LOPRESSOR) 50 MG tablet Take 1 tablet by mouth once daily 06/25/22   Corky Downs, MD  Propylene Glycol (SYSTANE BALANCE) 0.6 % SOLN Place 1 drop into both eyes daily as needed (dry eyes).    [provider]  valsartan-hydrochlorothiazide (DIOVAN-HCT) 80-12.5 MG tablet Take 2 tablets by mouth once daily 02/01/23   Kara Dies, NP    Inpatient Medications: Scheduled Meds:  Ferrous Fumarate  1 tablet Oral Daily   And   ascorbic acid  250 mg Oral Daily   [START ON 11/08/2023] aspirin EC  81 mg Oral Daily   atorvastatin  80 mg Oral Daily   cholecalciferol  1,000 Units Oral Daily   pantoprazole (PROTONIX) IV  40 mg Intravenous Q12H   Continuous Infusions:  sodium chloride     sodium chloride 150 mL/hr (11/07/23 0622)   ceFEPime (MAXIPIME) IV     heparin 850 Units/hr (11/07/23 0108)   lactated ringers     magnesium sulfate bolus IVPB Stopped (11/07/23 0732)   norepinephrine (LEVOPHED) Adult infusion 10 mcg/min (11/07/23  0605)   potassium chloride     PRN Meds: acetaminophen, albuterol, ALPRAZolam, magnesium hydroxide, nitroGLYCERIN, ondansetron (ZOFRAN) IV, polyvinyl alcohol, traZODone  Allergies:   No Known Allergies  Social History:   Social History   Socioeconomic History   Marital status: Married    Spouse name: Not on file   Number of children: Not on file   Years of education: Not on file   Highest education level: Not on file  Occupational History   Not on file  Tobacco Use   Smoking status: Never   Smokeless tobacco: Never  Vaping Use   Vaping status: Never Used  Substance and Sexual Activity   Alcohol use: Not Currently    Alcohol/week: 1.0 standard drink of alcohol    Types: 1 Glasses of wine per week   Drug use: No   Sexual activity: Not Currently    Birth control/protection: Post-menopausal, Surgical    Comment: hysterectomy  Other Topics Concern   Not on file  Social  History Narrative   Not on file   Social Drivers of Health   Financial Resource Strain: High Risk (09/29/2023)   Received from [facility]   Overall Financial Resource Strain (CARDIA)    Difficulty of Paying Living Expenses: Hard  Food Insecurity: Food Insecurity Present (09/29/2023)   Received from [facility]   Hunger Vital Sign    Worried About Running Out of Food in the Last Year: Often true    Ran Out of Food in the Last Year: Often true  Transportation Needs: No Transportation Needs (09/29/2023)   Received from [facility] - Transportation    In the past 12 months, has lack of transportation kept you from medical appointments or from getting medications?: No    Lack of Transportation (Non-Medical): No  Physical Activity: Inactive (07/27/2022)   Exercise Vital Sign    Days of Exercise per Week: 0 days    Minutes of Exercise per Session: 0 min  Stress: No Stress Concern Present (07/27/2022)   [employer] of Occupational [employer] -  Occupational Stress Questionnaire    Feeling of Stress : Not at all  Social Connections: Moderately Isolated (07/27/2022)   Social Connection and Isolation Panel [NHANES]    Frequency of Communication with Friends and Family: More than three times a week    Frequency of Social Gatherings with Friends and Family: Twice a week    Attends Religious Services: Never    Database administrator or Organizations: No    Attends Banker Meetings: Never    Marital Status: Married  Catering manager Violence: Not At Risk (07/27/2022)   Humiliation, Afraid, Rape, and Kick questionnaire    Fear of Current or Ex-Partner: No    Emotionally Abused: No    Physically Abused: No    Sexually Abused: No    Family History:   Family History  Problem Relation Age of Onset   Stroke Mother    Heart failure Mother    Thyroid disease Paternal Grandfather      ROS:  Please see the history of present illness.   All other ROS reviewed and negative.     Physical Exam/Data:   Vitals:   11/07/23 0700 11/07/23 0715 11/07/23 0730 11/07/23 0730  BP: 113/64     Pulse: 98 88 (!) 161   Resp: (!) 21 (!) 22 (!) 22   Temp:    97.9 F (36.6 C)  TempSrc:    Oral  SpO2: 100% 100% 100%   Weight:      Height:        Intake/Output Summary (Last 24 hours) at 11/07/2023 0738 Last data filed at 11/07/2023 2956 Gross per 24 hour  Intake 2300.03 ml  Output --  Net 2300.03 ml      11/06/2023   11:49 PM 09/06/2022    3:13 PM 08/05/2022    3:52 PM  Last 3 Weights  Weight (lbs) 153 lb 160 lb 160 lb 9.6 oz  Weight (kg) 69.4 kg 72.576 kg 72.848 kg     Body mass index is 27.1 kg/m.  General:  Well nourished, well developed, in no acute distress HEENT: normal Neck: no JVD Vascular: No carotid bruits; Distal pulses 2+ bilaterally Cardiac:  normal S1, S2; Irreg Irreg; no murmur  Lungs:  clear to auscultation bilaterally, no wheezing, rhonchi or rales  Abd: soft, nontender, no hepatomegaly  Ext: no  edema Musculoskeletal:  No deformities, BUE and BLE  strength normal and equal Skin: warm and dry  Neuro:  CNs 2-12 intact, no focal abnormalities noted Psych:  Normal affect    Telemetry:  Telemetry was personally reviewed and demonstrates:  Afib HR 90-110  Relevant CV Studies:  Echo ordered  Laboratory Data:  High Sensitivity Troponin:   Recent Labs  Lab 11/07/23 0002  TROPONINIHS 5,416*     Chemistry Recent Labs  Lab 11/07/23 0001 11/07/23 0002  NA  --  125*  K  --  2.6*  CL  --  92*  CO2  --  22  GLUCOSE  --  153*  BUN  --  12  CREATININE  --  0.88  CALCIUM  --  8.1*  MG 1.4*  --   GFRNONAA  --  >60  ANIONGAP  --  11    Recent Labs  Lab 11/07/23 0002  PROT 6.1*  ALBUMIN 3.2*  AST 43*  ALT 29  ALKPHOS 62  BILITOT 0.7   Lipids No results for input(s): "CHOL", "TRIG", "HDL", "LABVLDL", "LDLCALC", "CHOLHDL" in the last 168 hours.  Hematology Recent Labs  Lab 11/07/23 0001  WBC 4.2  RBC 3.69*  HGB 11.4*  HCT 30.9*  MCV 83.7  MCH 30.9  MCHC 36.9*  RDW 11.4*  PLT 123*   Thyroid No results for input(s): "TSH", "FREET4" in the last 168 hours.  BNP Recent Labs  Lab 11/07/23 0001  BNP 236.1*    DDimer No results for input(s): "DDIMER" in the last 168 hours.   Radiology/Studies:  CT Angio Chest PE W and/or Wo Contrast Result Date: 11/07/2023 CLINICAL DATA:  Evaluate for PE.  Dyspnea and tachycardia. EXAM: CT ANGIOGRAPHY CHEST WITH CONTRAST TECHNIQUE: Multidetector CT imaging of the chest was performed using the standard protocol during bolus administration of intravenous contrast. Multiplanar CT image reconstructions and MIPs were obtained to evaluate the vascular anatomy. RADIATION DOSE REDUCTION: This exam was performed according to the departmental dose-optimization program which includes automated exposure control, adjustment of the mA and/or kV according to patient size and/or use of iterative reconstruction technique. CONTRAST:  75mL OMNIPAQUE  IOHEXOL 350 MG/ML SOLN COMPARISON:  None Available. FINDINGS: Cardiovascular: Satisfactory opacification of the pulmonary arteries to the segmental level. No evidence of pulmonary embolism. The heart is enlarged. There are atherosclerotic calcifications of the aorta. No pericardial effusion. Mediastinum/Nodes: No enlarged mediastinal, hilar, or axillary lymph nodes. Thyroid gland, trachea, and esophagus demonstrate no significant findings. There is a small hiatal hernia. Lungs/Pleura: There is a 3 mm right upper lobe nodule image 5/70. There is a small amount of atelectasis in the medial right upper lobe, right middle lobe and left lower lobe. The lungs are otherwise clear. There some central peribronchial wall thickening. There is no pleural effusion or pneumothorax. Upper Abdomen: No acute abnormality. Musculoskeletal: No chest wall abnormality. No acute or significant osseous findings. Review of the MIP images confirms the above findings. IMPRESSION: 1. No evidence for pulmonary embolism. 2. Cardiomegaly. 3. Central peribronchial wall thickening worrisome for bronchitis. 4. Right solid pulmonary nodule within the upper lobe measuring 3 mm. Per Fleischner Society Guidelines, if patient is low risk for malignancy, no routine follow-up imaging is recommended. If patient is high risk for malignancy, a non-contrast Chest CT at 12 months is optional. If performed and the nodule is stable at 12 months, no further follow-up is recommended. These guidelines do not apply to immunocompromised patients and patients with cancer. Follow up in patients with significant comorbidities as clinically warranted. For  lung cancer screening, adhere to Lung-RADS guidelines. Reference: Radiology. 2017; 284(1):228-43. 5. Small hiatal hernia. 6. Aortic atherosclerosis. Aortic Atherosclerosis (ICD10-I70.0). Electronically Signed   By: Darliss Cheney M.D.   On: 11/07/2023 01:52   CT HEAD WO CONTRAST ( ) Result Date: 11/07/2023 CLINICAL  DATA:  Nonfocal encephalopathy EXAM: CT HEAD WITHOUT CONTRAST TECHNIQUE: Contiguous axial images were obtained from the base of the skull through the vertex without intravenous contrast. RADIATION DOSE REDUCTION: This exam was performed according to the departmental dose-optimization program which includes automated exposure control, adjustment of the mA and/or kV according to patient size and/or use of iterative reconstruction technique. COMPARISON:  None Available. FINDINGS: Brain: Normal anatomic configuration. No abnormal intra or extra-axial mass lesion or fluid collection. No abnormal mass effect or midline shift. No evidence of acute intracranial hemorrhage or infarct. Ventricular size is normal. Cerebellum unremarkable. Vascular: Unremarkable Skull: Intact Sinuses/Orbits: Paranasal sinuses are clear. Orbits are unremarkable. Other: Mastoid air cells and middle ear cavities are clear. IMPRESSION: 1. No acute intracranial abnormality. Electronically Signed   By: Helyn Numbers M.D.   On: 11/07/2023 01:11   DG Chest Port 1 View Result Date: 11/07/2023 CLINICAL DATA:  Sepsis.  Shortness of breath. EXAM: PORTABLE CHEST 1 VIEW COMPARISON:  Chest x-ray 09/26/2019 FINDINGS: Heart is enlarged. The lungs are clear. There is no pleural effusion or pneumothorax. No acute fractures are seen. IMPRESSION: Cardiomegaly. No acute cardiopulmonary process. Electronically Signed   By: Darliss Cheney M.D.   On: 11/07/2023 00:19     Assessment and Plan:   Shock/SEPSIS AMS - septic vs cardiogenic - IV levophed>wean as able - recently diagnosed with UTI as OP. UA here negative - Hs trop 5000>6000. No chest pain reported - chest CTA negative for PE - also found to have new onset Afib  Tachyarrhythmia New onset Afib - initial EKG with afib vs atrial tachycardia - second EKG with ST and frequent PACs - IV metoprolol 5mg  given once and metoprolol 25mg  x1 - tele shows Afib HR 90-110 at this time - CHADSVASC at  least 5 (HTN, female, age x2,PAD) she will require long-term blood thinner - IV heparin  - K2.4 and Mag 1.4 on arrival. Keep Mag>2 and K>4 - not on any IV rate control at this time - TSH pending - echo ordered - Hgb 11.4>8.7. May need to hold IV heparin  NSTEMI - no chest pain reported - HS trop 1610>9604. Continue to trend troponin - EKG with no significant ischemic changes - in the setting of rapid afib - IV heparin. However Hgb 11>8, may need to hold as above - check echo - ASA 81mg  daily, Lipitor 80mg  daily - further work-up pending echo  Electrolyte abnormality Hypokalemia/Hypomagnesemia/hyponatremia - K 2.6 on arrival - Mag 1.4 - labs pending  Acute Anemia - Hgb 11.4>8.7 - may need to hold IV heparin - she reports black stools 2 days ago - will order FOBT - will likely required further GI work-up - will order repeat CBC  HTN - PTA amlodipine and metoprolol held   For questions or updates, please contact [facility] Please consult [email or web address] for contact info under    Signed, Landry Kamath David Stall, PA-C  11/07/2023 7:38 AM

## 2023-11-07 NOTE — Consult Note (Signed)
 PHARMACY CONSULT NOTE - FOLLOW UP  Pharmacy Consult for Electrolyte Monitoring and Replacement   Recent Labs: Potassium (mmol/L)  Date Value  11/07/2023 4.1   Magnesium (mg/dL)  Date Value  16/05/9603 1.4 (L)   Calcium (mg/dL)  Date Value  54/04/8118 7.6 (L)   Albumin (g/dL)  Date Value  14/78/2956 2.7 (L)   Phosphorus (mg/dL)  Date Value  21/30/8657 1.8 (L)   Sodium (mmol/L)  Date Value  11/07/2023 131 (L)   Assessment: EA is a 78 yo female who presented to the ED 3/9 with altered mental status and shortness of breath with fever. They were diagnosed with a UTI 2 days ago and were on Macrobid. They were admitted to the ICU due to requiring levophed. Pharmacy was consulted to manage this patient's electrolytes while they're in the ICU.   Goal of Therapy:  Electrolytes WNL  Plan:  Provider replaced potassium with 110 mEq of Kcl Provider replaced with 4g of magnesium sulfate 4g  Ordered Sodium phosphate 15 mmol to replace phosphate Continue to follow electrolytes with AM labs  Thank you for allowing pharmacy to participate in this patient's care.   Effie Shy, PharmD Pharmacy Resident  11/07/2023 2:55 PM

## 2023-11-07 NOTE — Assessment & Plan Note (Signed)
-   This is clearly contributing to her tachycardia. - Aggressive potassium replacement will be pursued.

## 2023-11-07 NOTE — Plan of Care (Signed)
  Problem: Education: Goal: Understanding of cardiac disease, CV risk reduction, and recovery process will improve Outcome: Progressing Goal: Individualized Educational Video(s) Outcome: Progressing   Problem: Activity: Goal: Ability to tolerate increased activity will improve Outcome: Progressing   Problem: Cardiac: Goal: Ability to achieve and maintain adequate cardiovascular perfusion will improve Outcome: Progressing   Problem: Health Behavior/Discharge Planning: Goal: Ability to safely manage health-related needs after discharge will improve Outcome: Progressing   Problem: Fluid Volume: Goal: Hemodynamic stability will improve Outcome: Progressing   Problem: Clinical Measurements: Goal: Diagnostic test results will improve Outcome: Progressing Goal: Signs and symptoms of infection will decrease Outcome: Progressing   Problem: Respiratory: Goal: Ability to maintain adequate ventilation will improve Outcome: Progressing   Problem: Education: Goal: Knowledge of General Education information will improve Description: Including pain rating scale, medication(s)/side effects and non-pharmacologic comfort measures Outcome: Progressing   Problem: Health Behavior/Discharge Planning: Goal: Ability to manage health-related needs will improve Outcome: Progressing   Problem: Clinical Measurements: Goal: Ability to maintain clinical measurements within normal limits will improve Outcome: Progressing Goal: Will remain free from infection Outcome: Progressing Goal: Diagnostic test results will improve Outcome: Progressing Goal: Respiratory complications will improve Outcome: Progressing Goal: Cardiovascular complication will be avoided Outcome: Progressing   Problem: Activity: Goal: Risk for activity intolerance will decrease Outcome: Progressing   Problem: Nutrition: Goal: Adequate nutrition will be maintained Outcome: Progressing   Problem: Coping: Goal: Level of  anxiety will decrease Outcome: Progressing   Problem: Elimination: Goal: Will not experience complications related to bowel motility Outcome: Progressing Goal: Will not experience complications related to urinary retention Outcome: Progressing   Problem: Pain Managment: Goal: General experience of comfort will improve and/or be controlled Outcome: Progressing   Problem: Safety: Goal: Ability to remain free from injury will improve Outcome: Progressing   Problem: Skin Integrity: Goal: Risk for impaired skin integrity will decrease Outcome: Progressing

## 2023-11-07 NOTE — Progress Notes (Signed)
 PHARMACY - PHYSICIAN COMMUNICATION CRITICAL VALUE ALERT - BLOOD CULTURE IDENTIFICATION (BCID)  Amanda Cruz is an 78 y.o. female who presented to [facility] on 11/06/2023 with a chief complaint of AMS. History of recent urinary tract infection treated with Macrobid.   Assessment:  3/4(2 anaerobic, 1 aerobic): enterobacterales, e.coli, CTX-ESBL negative   Name of physician (or Provider) Contacted: Dr. Rhina Brackett Assaker  Current antibiotics: Cefepime 2g Q12H  Changes to prescribed antibiotics recommended:  Recommendations accepted by provider Change to ceftriaxone 2 g daily   Results for orders placed or performed during the hospital encounter of 11/06/23  Blood Culture ID Panel (Reflexed) (Collected: 11/07/2023 12:03 AM)  Result Value Ref Range   Enterococcus faecalis NOT DETECTED NOT DETECTED   Enterococcus Faecium NOT DETECTED NOT DETECTED   Listeria monocytogenes NOT DETECTED NOT DETECTED   Staphylococcus species NOT DETECTED NOT DETECTED   Staphylococcus aureus (BCID) NOT DETECTED NOT DETECTED   Staphylococcus epidermidis NOT DETECTED NOT DETECTED   Staphylococcus lugdunensis NOT DETECTED NOT DETECTED   Streptococcus species NOT DETECTED NOT DETECTED   Streptococcus agalactiae NOT DETECTED NOT DETECTED   Streptococcus pneumoniae NOT DETECTED NOT DETECTED   Streptococcus pyogenes NOT DETECTED NOT DETECTED   A.calcoaceticus-baumannii NOT DETECTED NOT DETECTED   Bacteroides fragilis NOT DETECTED NOT DETECTED   Enterobacterales DETECTED (A) NOT DETECTED   Enterobacter cloacae complex NOT DETECTED NOT DETECTED   Escherichia coli DETECTED (A) NOT DETECTED   Klebsiella aerogenes NOT DETECTED NOT DETECTED   Klebsiella oxytoca NOT DETECTED NOT DETECTED   Klebsiella pneumoniae NOT DETECTED NOT DETECTED   Proteus species NOT DETECTED NOT DETECTED   Salmonella species NOT DETECTED NOT DETECTED   Serratia marcescens NOT DETECTED NOT DETECTED   Haemophilus influenzae NOT DETECTED NOT  DETECTED   Neisseria meningitidis NOT DETECTED NOT DETECTED   Pseudomonas aeruginosa NOT DETECTED NOT DETECTED   Stenotrophomonas maltophilia NOT DETECTED NOT DETECTED   Candida albicans NOT DETECTED NOT DETECTED   Candida auris NOT DETECTED NOT DETECTED   Candida glabrata NOT DETECTED NOT DETECTED   Candida krusei NOT DETECTED NOT DETECTED   Candida parapsilosis NOT DETECTED NOT DETECTED   Candida tropicalis NOT DETECTED NOT DETECTED   Cryptococcus neoformans/gattii NOT DETECTED NOT DETECTED   CTX-M ESBL NOT DETECTED NOT DETECTED   Carbapenem resistance IMP NOT DETECTED NOT DETECTED   Carbapenem resistance KPC NOT DETECTED NOT DETECTED   Carbapenem resistance NDM NOT DETECTED NOT DETECTED   Carbapenem resist OXA 48 LIKE NOT DETECTED NOT DETECTED   Carbapenem resistance VIM NOT DETECTED NOT DETECTED    Effie Shy, PharmD Pharmacy Resident  11/07/2023 12:22 PM

## 2023-11-07 NOTE — Assessment & Plan Note (Signed)
 Antihypertensive therapy will be on hold including beta-blocker therapy pending improvement of her blood pressure on pressors.

## 2023-11-07 NOTE — Progress Notes (Signed)
*  PRELIMINARY RESULTS* Echocardiogram 2D Echocardiogram has been performed.  Cristela Blue 11/07/2023, 2:41 PM

## 2023-11-07 NOTE — Consult Note (Signed)
 NAME:  Amanda Cruz, MRN:  [id number], DOB:  [birth year], LOS: 0 ADMISSION DATE:  11/06/2023, CONSULTATION DATE: 11/07/2023 REFERRING MD: Valente David CHIEF COMPLAINT: Altered mental status  HPI  78 y.o female with significant PMH of PVD, mild asthma, HTN, urinary retention, cystocele, GERD who presented to the ED with chief complaints of altered mental status with associated dyspnea and fever.  Per ED reports, patient is recently diagnosed with UTI on Friday and was started on antibiotics. She reports developing shortness of breath and feeling generalized weakness, fever despite treatment.   ED Course: Initial vital signs showed HR of 115 beats/minute, BP 132/61 mm Hg, the RR 25 breaths/minute, and the oxygen saturation 96% on RA and a temperature of 98.61F (37.1C). Pertinent Labs/Diagnostics Findings: Na+/ K+: 125/2.6.  Glucose: 153 BUN/Cr.:Calcium:  AST/ALT: Plts: 123 otherwise unremarkable WBC PCT:2.12 COVID PCR: Negative,  troponin:5416  BNP: 236  CXR> CTH> CTA Chest>see result Medication administered in the ED: Patient given 30 cc/kg of fluids and started on broad-spectrum antibiotics Vanco cefepime and Flagyl for sepsis with septic shock. Patient remained hypotensive despite IVF boluses therefore was started on Levophed.  PCCM consulted.  Past Medical History  PVD, mild asthma, HTN, urinary retention, cystocele, GERD  Significant Hospital Events   3/9: Admitted to [facility] with altered mental status secondary to suspected UTI and NSTEMI.  Started on levo for hypotension post metoprolol administration.  PCCM consulted  Consults:  PCCM Cardiology  Procedures:  None  Significant Diagnostic Tests:  3/10: Chest Xray> IMPRESSION: Cardiomegaly. No acute cardiopulmonary process.  3/10: Noncontrast CT head> IMPRESSION: 1. No acute intracranial abnormality.  3/10: CTA Chest> IMPRESSION: 1. No evidence for pulmonary embolism. 2. Cardiomegaly. 3. Central peribronchial wall  thickening worrisome for bronchitis. 4. Right solid pulmonary nodule within the upper lobe measuring 3 mm. Per Fleischner Society Guidelines, if patient is low risk for malignancy, no routine follow-up imaging is recommended. If patient is high risk for malignancy, a non-contrast Chest CT at 12 months is optional. If performed and the nodule is stable at 12 months, no further follow-up is recommended. These guidelines do not apply to immunocompromised patients and patients with cancer. Follow up in patients with significant comorbidities as clinically warranted. For lung cancer screening, adhere to Lung-RADS guidelines. Reference: Radiology. 2017; 284(1):228-43. 5. Small hiatal hernia. 6. Aortic atherosclerosis. Interim History / Subjective:      Micro Data:  3/10: SARS-CoV-2 PCR> negative 3/10: Influenza PCR> negative 3/10: Blood culture x2> 3/10: MRSA PCR>>   Antimicrobials:  Vancomycin 3/10 Cefepime 3/10 Ceftriaxone 3/10>> Metronidazole 3/10  OBJECTIVE  Blood pressure (!) 91/47, pulse 87, temperature 98.7 F (37.1 C), temperature source Oral, resp. rate 18, height 5\' 3"  (1.6 m), weight 69.4 kg, SpO2 100%.     Intake/Output Summary (Last 24 hours) at 11/07/2023 2956 Last data filed at 11/07/2023 0440 Gross per 24 hour  Intake 1300.03 ml  Output --  Net 1300.03 ml   Filed Weights   11/06/23 2349  Weight: 69.4 kg    Physical Examination  GENERAL:  78 year-old critically ill patient lying in the bed in mild respiratory distress EYES: PEERLA. No scleral icterus. Extraocular muscles intact.  HEENT: Head atraumatic, normocephalic. Oropharynx and nasopharynx clear.  NECK:  No JVD, supple  LUNGS: Decreased breath sounds with scattered wheezes bilaterally.  mild use of accessory muscles of respiration.  CARDIOVASCULAR: S1, S2 normal. No murmurs, rubs, or gallops.  ABDOMEN: Soft, NTND EXTREMITIES: No swelling or erythema.  Capillary refill <  3 seconds in all extremities.  Pulses palpable distally. NEUROLOGIC: The patient is . No focal neurological deficit appreciated. Cranial nerves are intact.  SKIN: No obvious rash, lesion, or ulcer. Warm to touch Labs/imaging that I havepersonally reviewed  (right click and "Reselect all SmartList Selections" daily)     Labs   CBC: Recent Labs  Lab 11/07/23 0001  WBC 4.2  NEUTROABS 3.9  HGB 11.4*  HCT 30.9*  MCV 83.7  PLT 123*    Basic Metabolic Panel: Recent Labs  Lab 11/07/23 0001 11/07/23 0002  NA  --  125*  K  --  2.6*  CL  --  92*  CO2  --  22  GLUCOSE  --  153*  BUN  --  12  CREATININE  --  0.88  CALCIUM  --  8.1*  MG 1.4*  --    GFR: Estimated Creatinine Clearance: 49.2 mL/min (by C-G formula based on SCr of 0.88 mg/dL). Recent Labs  Lab 11/07/23 0001 11/07/23 0002  PROCALCITON  --  2.12  WBC 4.2  --   LATICACIDVEN 1.4  --    Liver Function Tests: Recent Labs  Lab 11/07/23 0002  AST 43*  ALT 29  ALKPHOS 62  BILITOT 0.7  PROT 6.1*  ALBUMIN 3.2*   No results for input(s): "LIPASE", "AMYLASE" in the last 168 hours. No results for input(s): "AMMONIA" in the last 168 hours.  ABG No results found for: "PHART", "PCO2ART", "PO2ART", "HCO3", "TCO2", "ACIDBASEDEF", "O2SAT"   Coagulation Profile: Recent Labs  Lab 11/07/23 0001  INR 1.0   Cardiac Enzymes: No results for input(s): "CKTOTAL", "CKMB", "CKMBINDEX", "TROPONINI" in the last 168 hours.  HbA1C: No results found for: "HGBA1C"  CBG: No results for input(s): "GLUCAP" in the last 168 hours.  Review of Systems:   Unable to be obtained secondary to the patient's altered mental status  Past Medical History  She,  has a past medical history of COVID-19 (2021), GERD (gastroesophageal reflux disease), Hypertension, and Wears dentures.   Surgical History    Past Surgical History:  Procedure Laterality Date   ANTERIOR AND POSTERIOR REPAIR N/A 05/11/2021   Procedure: ANTERIOR (CYSTOCELE) AND POSTERIOR REPAIR (RECTOCELE);   Surgeon: Linzie Collin, MD;  Location: [facility];  Service: Gynecology;  Laterality: N/A;   CATARACT EXTRACTION W/PHACO Left 08/22/2019   Procedure: CATARACT EXTRACTION PHACO AND INTRAOCULAR LENS PLACEMENT (IOC) LEFT  9.98  01:10.4  14.2%;  Surgeon: Lockie Mola, MD;  Location: [facility];  Service: Ophthalmology;  Laterality: Left;   CATARACT EXTRACTION W/PHACO Right 09/19/2019   Procedure: CATARACT EXTRACTION PHACO AND INTRAOCULAR LENS PLACEMENT (IOC) RIGHT PANOPTIX LENS 7.44  00:52.4  14.3%;  Surgeon: Lockie Mola, MD;  Location: [facility];  Service: Ophthalmology;  Laterality: Right;   COLONOSCOPY WITH PROPOFOL N/A 05/09/2018   Procedure: COLONOSCOPY WITH PROPOFOL;  Surgeon: Midge Minium, MD;  Location: [facility];  Service: Endoscopy;  Laterality: N/A;   ESOPHAGOGASTRODUODENOSCOPY (EGD) WITH PROPOFOL N/A 02/05/2020   Procedure: ESOPHAGOGASTRODUODENOSCOPY (EGD) WITH PROPOFOL;  Surgeon: Midge Minium, MD;  Location: [facility];  Service: Endoscopy;  Laterality: N/A;   EYE SURGERY     FOOT SURGERY Bilateral    KNEE ARTHROSCOPY Right    PUBOVAGINAL SLING N/A 05/11/2021   Procedure: PUBO-VAGINAL SLING (TOT);  Surgeon: Linzie Collin, MD;  Location: [facility];  Service: Gynecology;  Laterality: N/A;   TEAR DUCT PROBING  03/10/2015   TUBAL LIGATION     VAGINAL HYSTERECTOMY N/A 05/11/2021  Procedure: HYSTERECTOMY VAGINAL;  Surgeon: Linzie Collin, MD;  Location: [facility];  Service: Gynecology;  Laterality: N/A;     Social History   reports that she has never smoked. She has never used smokeless tobacco. She reports that she does not currently use alcohol after a past usage of about 1.0 standard drink of alcohol per week. She reports that she does not use drugs.   Family History   Her family history includes Heart failure in her mother; Stroke in her mother; Thyroid disease in her paternal grandfather.   Allergies No Known Allergies   Home  Medications  Prior to Admission medications   Medication Sig Start Date End Date Taking? Authorizing Provider  albuterol (VENTOLIN HFA) 108 (90 Base) MCG/ACT inhaler Inhale 2 puffs into the lungs every 6 (six) hours as needed for wheezing or shortness of breath. 08/12/21   Corky Downs, MD  amLODipine (NORVASC) 2.5 MG tablet Take 1 tablet (2.5 mg total) by mouth daily. 09/06/22   Corky Downs, MD  atorvastatin (LIPITOR) 40 MG tablet Take 1 tablet by mouth once daily 07/05/22   Corky Downs, MD  Biotin w/ Vitamins C & E (HAIR/SKIN/NAILS PO) Take 1 tablet by mouth daily.    [provider]  Cholecalciferol (VITAMIN D3) 25 MCG (1000 UT) CAPS Take 1,000 Units by mouth daily.    [provider]  fluticasone-salmeterol (ADVAIR) 100-50 MCG/ACT AEPB Inhale 1 puff into the lungs 2 (two) times daily. 05/04/22   Corky Downs, MD  Glucosamine 500 MG CAPS Take by mouth.    [provider]  IRON-VITAMIN C PO Take 1 tablet by mouth daily.    [provider]  metoprolol tartrate (LOPRESSOR) 50 MG tablet Take 1 tablet by mouth once daily 06/25/22   Corky Downs, MD  Propylene Glycol (SYSTANE BALANCE) 0.6 % SOLN Place 1 drop into both eyes daily as needed (dry eyes).    [provider]  valsartan-hydrochlorothiazide (DIOVAN-HCT) 80-12.5 MG tablet Take 2 tablets by mouth once daily 02/01/23   Kara Dies, NP  Scheduled Meds:  amLODipine  2.5 mg Oral Daily   Ferrous Fumarate  1 tablet Oral Daily   And   ascorbic acid  250 mg Oral Daily   [START ON 11/08/2023] aspirin EC  81 mg Oral Daily   atorvastatin  80 mg Oral Daily   cholecalciferol  1,000 Units Oral Daily   potassium chloride  40 mEq Oral Once   potassium chloride  40 mEq Oral Once   Continuous Infusions:  sodium chloride     sodium chloride 150 mL/hr (11/07/23 0622)   ceFEPime (MAXIPIME) IV     heparin 850 Units/hr (11/07/23 0108)   magnesium sulfate bolus IVPB     norepinephrine (LEVOPHED) Adult  infusion 10 mcg/min (11/07/23 0605)   PRN Meds:.acetaminophen, albuterol, ALPRAZolam, magnesium hydroxide, nitroGLYCERIN, ondansetron (ZOFRAN) IV, polyvinyl alcohol, traZODone  Active Hospital Problem list   See systems below  Assessment & Plan:  #Sepsis due to suspected UTI ?intrabdominal source #?Septic shock (although received IV metoprolol 5 mg x 1 and 25 mg p.o. for tachycardia) Initial interventions/workup included: 2 L of NS/LR & Cefepime/ Vancomycin/ Metronidazole meets SIRS criteria: Heart Rate 128 beats/minute, Respiratory Rate 25 breaths/minute, Shock Index:1.8 -check ct abdomen/pelvis if appropriate -Supplemental oxygen as needed, to maintain SpO2 > 90% -F/u cultures, trend lactic/ PCT -Monitor WBC/ fever curve -IV antibiotics -IVF hydration as needed -Pressors for MAP goal >65 -Strict I/O's   #NSTEMI   #?New onset A-fib  with RVR EKG 12 lead narrow complex tachycardia.  No evidence of acute STEMI Troponin significantly elevated > 5000, CTA negative for PE -TTEcho to assess LV Function  -Trend troponins untill peak -Continue heparin drip -Check TSH & lipid panel -Cardiology  consult  #Hypertension #Hyperlipidemia -Hold amlodipine and metoprolol -Continue atorvastatin  #Acute Metabolic Encephalopathy due to SEPSIS -Provide supportive care -Avoid sedatives as able   #Hypokalemia #Hyponatremia -Monitor I&O's / urinary output -Follow BMP -Replace electrolytes as indicated   Best practice:  Diet:  Oral Pain/Anxiety/Delirium protocol (if indicated): No VAP protocol (if indicated): Not indicated DVT prophylaxis: Systemic AC GI prophylaxis: PPI Glucose control:  SSI No Central venous access:  N/A Arterial line:  N/A Foley:  N/A Mobility:  bed rest  PT consulted: N/A Last date of multidisciplinary goals of care discussion [No family at bedside for updates] Code Status:  full code Disposition: ICU   = Goals of Care = Code Status Order: FULL  Primary  Emergency ContactLaverda Sorenson, Home Phone: [phone]   Critical care time: 45 minutes        Webb Silversmith DNP, CCRN, FNP-C, AGACNP-BC Acute Care & Family Nurse Practitioner [facility] PCCM on call pager [phone]

## 2023-11-07 NOTE — Assessment & Plan Note (Signed)
-   Aggressive magnesium replacement will be pursued as well.

## 2023-11-07 NOTE — H&P (Signed)
 [place]   PATIENT NAME: Amanda Cruz    MR#:  [id number]  DATE OF BIRTH:  [birth year]  DATE OF ADMISSION:  11/06/2023  PRIMARY CARE PHYSICIAN: Amanda Downs, MD   Patient is coming from: Home  REQUESTING/REFERRING PHYSICIAN: Delton Prairie, MD  CHIEF COMPLAINT:   Chief Complaint  Patient presents with   Altered Mental Status    HISTORY OF PRESENT ILLNESS:  Amanda Cruz is a 78 y.o. Filipino female with medical history significant for GERD, hypertension sigmoid polyp and chronic venous insufficiency, who presented to the emergency room with acute onset of altered mental status with associated dyspnea and tactile fever.  She was recently diagnosed with UTI and went to urgent care on Friday.  She was reportedly started on antibiotic therapy that she did not recall.  She admitted to nausea and vomiting with associated diarrhea.  No cough or wheezing.  She admitted to chest tightness moderate in intensity with no radiation.  No dysuria, oliguria or hematuria or flank pain.  No bleeding diathesis.  ED Course: When she came to the ER heart rate was 115 with respiratory to of 25 and otherwise normal vital signs.  Later on BP was down to 81/65 with a MAP of 72 and later 82/49 with a MAP of 61.  CMP revealed hyponatremia 125 and hypochloremia of 92 with hypokalemia of 2.6.  Albumin was 3.2 with total protein of 6.1 and AST 43.  High sensitive troponin I came back significantly elevated at 5416 and BNP 226.1.  Lactic acid was 1.4 and procalcitonin 2.12 CBC showed anemia with hemoglobin of 11.4 hematocrit 30.9 compared to 13.5 and 39.5 with platelets of 123.  Blood cultures were drawn.  Urinalysis pending EKG as reviewed by me : .  Initial EKG showed ectopic atrial tachycardia with rate 137.  Follow-up EKG revealed sinus tachycardia with a rate of 114 with multiple premature supraventricular complexes. Imaging: Noncon Dressit head CT scan revealed no acute intracranial normalities.   Portable chest x-ray showed cardiomegaly with no acute cardiopulmonary disease.  The patient was placed on IV heparin with bolus and drip,.  She was given 1 L bolus of IV lactated ringer and 5 mg of IV Lopressor and 25 mg p.o. Lopressor in addition to 2 g of IV Rocephin.  Despite aggressive hydration with IV fluids she continued to be hypotensive.  She will be started on IV Levophed.  She will be admitted to an ICU bed for further evaluation and management PAST MEDICAL HISTORY:   Past Medical History:  Diagnosis Date   COVID-19 2021   GERD (gastroesophageal reflux disease)    Hypertension    Wears dentures    full upper and lower    PAST SURGICAL HISTORY:   Past Surgical History:  Procedure Laterality Date   ANTERIOR AND POSTERIOR REPAIR N/A 05/11/2021   Procedure: ANTERIOR (CYSTOCELE) AND POSTERIOR REPAIR (RECTOCELE);  Surgeon: Amanda Collin, MD;  Location: [facility];  Service: Gynecology;  Laterality: N/A;   CATARACT EXTRACTION W/PHACO Left 08/22/2019   Procedure: CATARACT EXTRACTION PHACO AND INTRAOCULAR LENS PLACEMENT (IOC) LEFT  9.98  01:10.4  14.2%;  Surgeon: Amanda Mola, MD;  Location: [facility];  Service: Ophthalmology;  Laterality: Left;   CATARACT EXTRACTION W/PHACO Right 09/19/2019   Procedure: CATARACT EXTRACTION PHACO AND INTRAOCULAR LENS PLACEMENT (IOC) RIGHT PANOPTIX LENS 7.44  00:52.4  14.3%;  Surgeon: Amanda Mola, MD;  Location: [facility];  Service: Ophthalmology;  Laterality: Right;  COLONOSCOPY WITH PROPOFOL N/A 05/09/2018   Procedure: COLONOSCOPY WITH PROPOFOL;  Surgeon: Amanda Minium, MD;  Location: [facility];  Service: Endoscopy;  Laterality: N/A;   ESOPHAGOGASTRODUODENOSCOPY (EGD) WITH PROPOFOL N/A 02/05/2020   Procedure: ESOPHAGOGASTRODUODENOSCOPY (EGD) WITH PROPOFOL;  Surgeon: Amanda Minium, MD;  Location: [facility] END[facility];  Service: Endoscopy;  Laterality: N/A;   EYE SURGERY     FOOT SURGERY Bilateral    KNEE  ARTHROSCOPY Right    PUBOVAGINAL SLING N/A 05/11/2021   Procedure: PUBO-VAGINAL SLING (TOT);  Surgeon: Amanda Collin, MD;  Location: [facility];  Service: Gynecology;  Laterality: N/A;   TEAR DUCT PROBING  03/10/2015   TUBAL LIGATION     VAGINAL HYSTERECTOMY N/A 05/11/2021   Procedure: HYSTERECTOMY VAGINAL;  Surgeon: Amanda Collin, MD;  Location: [facility];  Service: Gynecology;  Laterality: N/A;    SOCIAL HISTORY:   Social History   Tobacco Use   Smoking status: Never   Smokeless tobacco: Never  Substance Use Topics   Alcohol use: Not Currently    Alcohol/week: 1.0 standard drink of alcohol    Types: 1 Glasses of wine per week    FAMILY HISTORY:   Family History  Problem Relation Age of Onset   Stroke Mother    Heart failure Mother    Thyroid disease Paternal Grandfather     DRUG ALLERGIES:  No Known Allergies  REVIEW OF SYSTEMS:   ROS As per history of present illness. All pertinent systems were reviewed above. Constitutional, HEENT, cardiovascular, respiratory, GI, GU, musculoskeletal, neuro, psychiatric, endocrine, integumentary and hematologic systems were reviewed and are otherwise negative/unremarkable except for positive findings mentioned above in the HPI.   MEDICATIONS AT HOME:   Prior to Admission medications   Medication Sig Start Date End Date Taking? Authorizing Provider  albuterol (VENTOLIN HFA) 108 (90 Base) MCG/ACT inhaler Inhale 2 puffs into the lungs every 6 (six) hours as needed for wheezing or shortness of breath. 08/12/21   Amanda Downs, MD  amLODipine (NORVASC) 2.5 MG tablet Take 1 tablet (2.5 mg total) by mouth daily. 09/06/22   Amanda Downs, MD  atorvastatin (LIPITOR) 40 MG tablet Take 1 tablet by mouth once daily 07/05/22   Amanda Downs, MD  Biotin w/ Vitamins C & E (HAIR/SKIN/NAILS PO) Take 1 tablet by mouth daily.    [provider]  Cholecalciferol (VITAMIN D3) 25 MCG (1000 UT) CAPS Take 1,000 Units by mouth daily.     [provider]  fluticasone-salmeterol (ADVAIR) 100-50 MCG/ACT AEPB Inhale 1 puff into the lungs 2 (two) times daily. 05/04/22   Amanda Downs, MD  Glucosamine 500 MG CAPS Take by mouth.    [provider]  IRON-VITAMIN C PO Take 1 tablet by mouth daily.    [provider]  metoprolol tartrate (LOPRESSOR) 50 MG tablet Take 1 tablet by mouth once daily 06/25/22   Amanda Downs, MD  Propylene Glycol (SYSTANE BALANCE) 0.6 % SOLN Place 1 drop into both eyes daily as needed (dry eyes).    [provider]  valsartan-hydrochlorothiazide (DIOVAN-HCT) 80-12.5 MG tablet Take 2 tablets by mouth once daily 02/01/23   Kara Dies, NP      VITAL SIGNS:  Blood pressure (!) 90/44, pulse 97, temperature 98.7 F (37.1 C), temperature source Oral, resp. rate (!) 22, height 5\' 3"  (1.6 m), weight 69.4 kg, SpO2 99%.  PHYSICAL EXAMINATION:  Physical Exam  GENERAL: Acutely ill 78 y.o.-year-old patient lying in the bed with mild respiratory distress with conversational dyspnea EYES:  Pupils equal, round, reactive to light and accommodation. No scleral icterus. Extraocular muscles intact.  HEENT: Head atraumatic, normocephalic. Oropharynx and nasopharynx clear.  NECK:  Supple, no jugular venous distention. No thyroid enlargement, no tenderness.  LUNGS: Normal breath sounds bilaterally, no wheezing, rales,rhonchi or crepitation. No use of accessory muscles of respiration.  CARDIOVASCULAR: Regular rate and rhythm, S1, S2 normal. No murmurs, rubs, or gallops.  ABDOMEN: Soft, nondistended, nontender. Bowel sounds present. No organomegaly or mass.  EXTREMITIES: No pedal edema, cyanosis, or clubbing.  NEUROLOGIC: Cranial nerves II through XII are intact. Muscle strength 5/5 in all extremities. Sensation intact. Gait not checked.  PSYCHIATRIC: The patient is alert and oriented x 3.  Normal affect and good eye contact. SKIN: No obvious rash, lesion, or ulcer.   LABORATORY PANEL:    CBC Recent Labs  Lab 11/07/23 0001  WBC 4.2  HGB 11.4*  HCT 30.9*  PLT 123*   ------------------------------------------------------------------------------------------------------------------  Chemistries  Recent Labs  Lab 11/07/23 0001 11/07/23 0002  NA  --  125*  K  --  2.6*  CL  --  92*  CO2  --  22  GLUCOSE  --  153*  BUN  --  12  CREATININE  --  0.88  CALCIUM  --  8.1*  MG 1.4*  --   AST  --  43*  ALT  --  29  ALKPHOS  --  62  BILITOT  --  0.7   ------------------------------------------------------------------------------------------------------------------  Cardiac Enzymes No results for input(s): "TROPONINI" in the last 168 hours. ------------------------------------------------------------------------------------------------------------------  RADIOLOGY:  CT Angio Chest PE W and/or Wo Contrast Result Date: 11/07/2023 CLINICAL DATA:  Evaluate for PE.  Dyspnea and tachycardia. EXAM: CT ANGIOGRAPHY CHEST WITH CONTRAST TECHNIQUE: Multidetector CT imaging of the chest was performed using the standard protocol during bolus administration of intravenous contrast. Multiplanar CT image reconstructions and MIPs were obtained to evaluate the vascular anatomy. RADIATION DOSE REDUCTION: This exam was performed according to the departmental dose-optimization program which includes automated exposure control, adjustment of the mA and/or kV according to patient size and/or use of iterative reconstruction technique. CONTRAST:  75mL OMNIPAQUE IOHEXOL 350 MG/ML SOLN COMPARISON:  None Available. FINDINGS: Cardiovascular: Satisfactory opacification of the pulmonary arteries to the segmental level. No evidence of pulmonary embolism. The heart is enlarged. There are atherosclerotic calcifications of the aorta. No pericardial effusion. Mediastinum/Nodes: No enlarged mediastinal, hilar, or axillary lymph nodes. Thyroid gland, trachea, and esophagus demonstrate no significant findings.  There is a small hiatal hernia. Lungs/Pleura: There is a 3 mm right upper lobe nodule image 5/70. There is a small amount of atelectasis in the medial right upper lobe, right middle lobe and left lower lobe. The lungs are otherwise clear. There some central peribronchial wall thickening. There is no pleural effusion or pneumothorax. Upper Abdomen: No acute abnormality. Musculoskeletal: No chest wall abnormality. No acute or significant osseous findings. Review of the MIP images confirms the above findings. IMPRESSION: 1. No evidence for pulmonary embolism. 2. Cardiomegaly. 3. Central peribronchial wall thickening worrisome for bronchitis. 4. Right solid pulmonary nodule within the upper lobe measuring 3 mm. Per Fleischner Society Guidelines, if patient is low risk for malignancy, no routine follow-up imaging is recommended. If patient is high risk for malignancy, a non-contrast Chest CT at 12 months is optional. If performed and the nodule is stable at 12 months, no further follow-up is recommended. These guidelines do not apply to immunocompromised patients and patients with cancer. Follow up in patients with  significant comorbidities as clinically warranted. For lung cancer screening, adhere to Lung-RADS guidelines. Reference: Radiology. 2017; 284(1):228-43. 5. Small hiatal hernia. 6. Aortic atherosclerosis. Aortic Atherosclerosis (ICD10-I70.0). Electronically Signed   By: Darliss Cheney M.D.   On: 11/07/2023 01:52   CT HEAD WO CONTRAST ( ) Result Date: 11/07/2023 CLINICAL DATA:  Nonfocal encephalopathy EXAM: CT HEAD WITHOUT CONTRAST TECHNIQUE: Contiguous axial images were obtained from the base of the skull through the vertex without intravenous contrast. RADIATION DOSE REDUCTION: This exam was performed according to the departmental dose-optimization program which includes automated exposure control, adjustment of the mA and/or kV according to patient size and/or use of iterative reconstruction technique.  COMPARISON:  None Available. FINDINGS: Brain: Normal anatomic configuration. No abnormal intra or extra-axial mass lesion or fluid collection. No abnormal mass effect or midline shift. No evidence of acute intracranial hemorrhage or infarct. Ventricular size is normal. Cerebellum unremarkable. Vascular: Unremarkable Skull: Intact Sinuses/Orbits: Paranasal sinuses are clear. Orbits are unremarkable. Other: Mastoid air cells and middle ear cavities are clear. IMPRESSION: 1. No acute intracranial abnormality. Electronically Signed   By: Helyn Numbers M.D.   On: 11/07/2023 01:11   DG Chest Port 1 View Result Date: 11/07/2023 CLINICAL DATA:  Sepsis.  Shortness of breath. EXAM: PORTABLE CHEST 1 VIEW COMPARISON:  Chest x-ray 09/26/2019 FINDINGS: Heart is enlarged. The lungs are clear. There is no pleural effusion or pneumothorax. No acute fractures are seen. IMPRESSION: Cardiomegaly. No acute cardiopulmonary process. Electronically Signed   By: Darliss Cheney M.D.   On: 11/07/2023 00:19      IMPRESSION AND PLAN:  Assessment and Plan: * Shock (HCC) - This could be cardiogenic due to non-STEMI versus septic due to UTI. - She will be started on IV Levophed. - She will be admitted to an ICU bed. - The case was discussed with the ICU team.  Care will be transferred to [facility].  NSTEMI (non-ST elevated myocardial infarction) (HCC) - The patient was ordered high-dose statin therapy and fasting lipids will be obtained. - She was initially given IV and p.o. beta-blocker therapy with Lopressor. - We will continue IV heparin drip. - She will be placed on as needed IV morphine sulfate and sublingual nitroglycerin after improvement of her blood pressure on pressors.. - 2D echo and cardiology consult to be obtained. - I notified [facility] group about the patient.  Hypokalemia - This is clearly contributing to her tachycardia. - Aggressive potassium replacement will be pursued.  Hypomagnesemia - Aggressive magnesium  replacement will be pursued as well.  Dyslipidemia - Statin dose was increased.  Essential hypertension Antihypertensive therapy will be on hold including beta-blocker therapy pending improvement of her blood pressure on pressors.       DVT prophylaxis: IV heparin drip. Advanced Care Planning:  Code Status: full code. Family Communication:  The plan of care was discussed in details with the patient (and family). I answered all questions. The patient agreed to proceed with the above mentioned plan. Further management will depend upon hospital course. Disposition Plan: Back to previous home environment Consults called: Cardiology. All the records are reviewed and case discussed with ED provider.  Status is: Inpatient   At the time of the admission, it appears that the appropriate admission status for this patient is inpatient.  This is judged to be reasonable and necessary in order to provide the required intensity of service to ensure the patient's safety given the presenting symptoms, physical exam findings and initial radiographic and laboratory data in the context  of comorbid conditions.  The patient requires inpatient status due to high intensity of service, high risk of further deterioration and high frequency of surveillance required.  I certify that at the time of admission, it is my clinical judgment that the patient will require inpatient hospital care extending more than 2 midnights.                            Dispo: The patient is from: Home              Anticipated d/c is to: Home              Patient currently is not medically stable to d/c.              Difficult to place patient: No Authorized and performed by: Valente David, MD Total critical care time:   60     minutes. Due to a high probability of clinically significant, life-threatening deterioration, the patient required my highest level of preparedness to intervene emergently and I personally spent this critical care  time directly and personally managing the patient.  This critical care time included obtaining a history, examining the patient, pulse oximetry, ordering and review of studies, arranging urgent treatment with development of management plan, evaluation of patient's response to treatment, frequent reassessment, and discussions with other providers. This critical care time was performed to assess and manage the high probability of imminent, life-threatening deterioration that could result in multiorgan failure.  It was exclusive of separately billable procedures and treating other patients and teaching time.   Hannah Beat M.D on 11/07/2023 at 5:37 AM  [facility]   From 7 PM-7 AM, contact night-coverage [email or web address]  CC: Primary care physician; Amanda Downs, MD

## 2023-11-07 NOTE — ED Notes (Signed)
 Patient removed from bedpan.  ?

## 2023-11-07 NOTE — Progress Notes (Signed)
 ANTICOAGULATION CONSULT NOTE  Pharmacy Consult for heparin infusion Indication: ACS/STEMI  No Known Allergies  Patient Measurements: Height: 5\' 3"  (160 cm) Weight: 69.4 kg (153 lb) IBW/kg (Calculated) : 52.4 Heparin Dosing Weight: 66.7 kg  Vital Signs:    Labs: Recent Labs    11/07/23 0001 11/07/23 0002  HGB 11.4*  --   HCT 30.9*  --   PLT 123*  --   APTT 26  --   LABPROT 13.9  --   INR 1.0  --   CREATININE  --  0.88  TROPONINIHS  --  5,416*    Estimated Creatinine Clearance: 49.2 mL/min (by C-G formula based on SCr of 0.88 mg/dL).   Medical History: Past Medical History:  Diagnosis Date   COVID-19 2021   GERD (gastroesophageal reflux disease)    Hypertension    Wears dentures    full upper and lower    Assessment: Pt is a 78 yo female presenting to ED w/ AMS, currently being treated for UTI, found with elevated Troponin I level  Goal of Therapy:  Heparin level 0.3-0.7 units/ml Monitor platelets by anticoagulation protocol: Yes   Plan:  Bolus 4000 units x 1 Start heparin infusion at 850 units/hr Will check HL in 8 hr after start of infusion CBC daily while on heparin  Otelia Sergeant, PharmD, [facility] 11/07/2023 12:48 AM

## 2023-11-07 NOTE — Progress Notes (Signed)
 Pharmacy Antibiotic Note  Amanda Cruz is a 78 y.o. female admitted on 11/06/2023 with UTI.  Pharmacy has been consulted for Cefepime dosing for 7 days.  Plan: Cefepime 1 gm q12hr per indication & renal fxn.  Pharmacy will continue to follow and will adjust abx dosing whenever warranted.  Temp (24hrs), Avg:98.6 F (37 C), Min:98.5 F (36.9 C), Max:98.7 F (37.1 C)   Recent Labs  Lab 11/07/23 0001 11/07/23 0002  WBC 4.2  --   CREATININE  --  0.88  LATICACIDVEN 1.4  --     Estimated Creatinine Clearance: 49.2 mL/min (by C-G formula based on SCr of 0.88 mg/dL).    No Known Allergies  Antimicrobials this admission: 3/10 Ceftriaxone >> x 1 dose 3/10 Cefepime >> x 7 days  Microbiology results: 3/10 BCx: Pending  Thank you for allowing pharmacy to be a part of this patient's care.  Otelia Sergeant, PharmD, [facility] 11/07/2023 5:24 AM

## 2023-11-07 NOTE — Progress Notes (Addendum)
 ANTICOAGULATION CONSULT NOTE  Pharmacy Consult for heparin infusion Indication: ACS/STEMI  No Known Allergies  Patient Measurements: Height: 5\' 3"  (160 cm) Weight: 69.4 kg (153 lb) IBW/kg (Calculated) : 52.4 Heparin Dosing Weight: 66.7 kg  Vital Signs: Temp: 97.9 F (36.6 C) (03/10 0858) Temp Source: Oral (03/10 0858) BP: 100/59 (03/10 1000) Pulse Rate: 100 (03/10 1000)  Labs: Recent Labs    11/07/23 0001 11/07/23 0002 11/07/23 0651 11/07/23 0934  HGB 11.4*  --  8.7*  --   HCT 30.9*  --  25.4*  --   PLT 123*  --  115*  --   APTT 26  --   --  162*  LABPROT 13.9  --   --  15.6*  INR 1.0  --   --  1.2  HEPARINUNFRC  --   --   --  0.58  CREATININE  --  0.88  --  0.87  TROPONINIHS  --  5,416* 6,095*  --    Estimated Creatinine Clearance: 49.8 mL/min (by C-G formula based on SCr of 0.87 mg/dL).  Medical History: Past Medical History:  Diagnosis Date   COVID-19 2021   GERD (gastroesophageal reflux disease)    Hypertension    Wears dentures    full upper and lower   Assessment: Pt is a 78 yo female presenting to ED w/ AMS, currently being treated for UTI, found with elevated Troponin I level and now evaluated for NSTEMI. Per chart review, patient is not on anticoagulation therapy at home. Pharmacy has been consulted to manage this patient's heparin.  Baseline Labs INR/PT 1.2/15.6 Plt 123 Hgb 11.4  Monitor drop in hgb 8.7   Goal of Therapy:  Heparin level 0.3-0.7 units/ml Monitor platelets by anticoagulation protocol: Yes  Heparin Levels Date/Time  HL  Clinical Assessment 3/10@0934   0.58  Therapeutic x 1   Plan:  Continue heparin gtt at 850 units/hour Recheck therapeutic in 8 hours Continue to monitor CBC and platelets daily  Thank you for allowing pharmacy to participate in this patient's care.   Effie Shy, PharmD Pharmacy Resident  11/07/2023 12:04 PM

## 2023-11-07 NOTE — Assessment & Plan Note (Signed)
-  Statin dose was increased

## 2023-11-07 NOTE — Assessment & Plan Note (Addendum)
-   The patient was ordered high-dose statin therapy and fasting lipids will be obtained. - She was initially given IV and p.o. beta-blocker therapy with Lopressor. - We will continue IV heparin drip. - She will be placed on as needed IV morphine sulfate and sublingual nitroglycerin after improvement of her blood pressure on pressors.. - 2D echo and cardiology consult to be obtained. - I notified [facility] group about the patient.

## 2023-11-07 NOTE — Assessment & Plan Note (Signed)
-   This could be cardiogenic due to non-STEMI versus septic due to UTI. - She will be started on IV Levophed. - She will be admitted to an ICU bed. - The case was discussed with the ICU team.  Care will be transferred to [facility].

## 2023-11-07 NOTE — Progress Notes (Signed)
 Brief note.  78 year old female presents with altered mentation associated with shortness of breath and fever.  Diagnosed with UTI 2 days prior to presentation.  Started on oral antibiotics.  Developed interval dyspnea, generalized weakness.  Blood pressure soft on presentation.  Started on intravenous fluids and broad-spectrum IV antibiotics.  Remained hypotensive despite multiple IVF boluses.  Started on Levophed.  Admitting provider spoke with PCCM.  ICU will assume care due to septic shock.  [facility] hospitalist service to sign off at this time.  Appreciates with response and assistance from PCCM team.  Please contact our service when patient stable for transfer back to general medical service.  Lolita Patella MD  No charge

## 2023-11-08 DIAGNOSIS — A419 Sepsis, unspecified organism: Secondary | ICD-10-CM

## 2023-11-08 DIAGNOSIS — R6521 Severe sepsis with septic shock: Secondary | ICD-10-CM | POA: Diagnosis not present

## 2023-11-08 DIAGNOSIS — A4151 Sepsis due to Escherichia coli [E. coli]: Principal | ICD-10-CM

## 2023-11-08 DIAGNOSIS — R570 Cardiogenic shock: Secondary | ICD-10-CM

## 2023-11-08 DIAGNOSIS — R911 Solitary pulmonary nodule: Secondary | ICD-10-CM | POA: Insufficient documentation

## 2023-11-08 DIAGNOSIS — I214 Non-ST elevation (NSTEMI) myocardial infarction: Secondary | ICD-10-CM | POA: Diagnosis not present

## 2023-11-08 DIAGNOSIS — E871 Hypo-osmolality and hyponatremia: Secondary | ICD-10-CM | POA: Insufficient documentation

## 2023-11-08 HISTORY — DX: Sepsis due to Escherichia coli (e. coli): A41.51

## 2023-11-08 LAB — BASIC METABOLIC PANEL
Anion gap: 10 (ref 5–15)
BUN: 11 mg/dL (ref 8–23)
CO2: 22 mmol/L (ref 22–32)
Calcium: 7.6 mg/dL — ABNORMAL LOW (ref 8.9–10.3)
Chloride: 100 mmol/L (ref 98–111)
Creatinine, Ser: 0.82 mg/dL (ref 0.44–1.00)
GFR, Estimated: >60 mL/min (ref >60)
Glucose, Bld: 132 mg/dL — ABNORMAL HIGH (ref 70–99)
Potassium: 3.2 mmol/L — ABNORMAL LOW (ref 3.5–5.1)
Sodium: 132 mmol/L — ABNORMAL LOW (ref 135–145)

## 2023-11-08 LAB — CBC
HCT: 27.1 % — ABNORMAL LOW (ref 36.0–46.0)
Hemoglobin: 10 g/dL — ABNORMAL LOW (ref 12.0–15.0)
MCH: 30.9 pg (ref 26.0–34.0)
MCHC: 36.9 g/dL — ABNORMAL HIGH (ref 30.0–36.0)
MCV: 83.6 fL (ref 80.0–100.0)
Platelets: 127 10*3/uL — ABNORMAL LOW (ref 150–400)
RBC: 3.24 MIL/uL — ABNORMAL LOW (ref 3.87–5.11)
RDW: 11.9 % (ref 11.5–15.5)
WBC: 7.1 10*3/uL (ref 4.0–10.5)
nRBC: 0 % (ref 0.0–0.2)

## 2023-11-08 LAB — RESPIRATORY PANEL BY PCR

## 2023-11-08 LAB — PROCALCITONIN: Procalcitonin: 36.54 ng/mL

## 2023-11-08 LAB — PHOSPHORUS: Phosphorus: 2.8 mg/dL (ref 2.5–4.6)

## 2023-11-08 LAB — MAGNESIUM: Magnesium: 2.6 mg/dL — ABNORMAL HIGH (ref 1.7–2.4)

## 2023-11-08 LAB — HEPARIN LEVEL (UNFRACTIONATED): Heparin Unfractionated: 0.38 [IU]/mL (ref 0.30–0.70)

## 2023-11-08 MED ORDER — POTASSIUM CHLORIDE CRYS ER 20 MEQ PO TBCR
40.0000 meq | EXTENDED_RELEASE_TABLET | ORAL | Status: AC
  Administered 2023-11-08 (×2): 40 meq via ORAL
  Filled 2023-11-08 (×2): qty 2

## 2023-11-08 MED ORDER — ALBUTEROL SULFATE (2.5 MG/3ML) 0.083% IN NEBU
2.5000 mg | INHALATION_SOLUTION | Freq: Two times a day (BID) | RESPIRATORY_TRACT | Status: DC
  Administered 2023-11-08 – 2023-11-09 (×3): 2.5 mg via RESPIRATORY_TRACT
  Filled 2023-11-08 (×3): qty 3

## 2023-11-08 MED ORDER — METOPROLOL TARTRATE 5 MG/5ML IV SOLN
5.0000 mg | Freq: Once | INTRAVENOUS | Status: AC
  Administered 2023-11-08: 5 mg via INTRAVENOUS
  Filled 2023-11-08: qty 5

## 2023-11-08 MED ORDER — POTASSIUM CHLORIDE CRYS ER 20 MEQ PO TBCR
40.0000 meq | EXTENDED_RELEASE_TABLET | Freq: Once | ORAL | Status: AC
  Administered 2023-11-08: 40 meq via ORAL
  Filled 2023-11-08: qty 2

## 2023-11-08 MED ORDER — METOPROLOL TARTRATE 25 MG PO TABS
25.0000 mg | ORAL_TABLET | Freq: Two times a day (BID) | ORAL | Status: DC
  Administered 2023-11-08 – 2023-11-09 (×3): 25 mg via ORAL
  Filled 2023-11-08 (×3): qty 1

## 2023-11-08 NOTE — Progress Notes (Signed)
 ANTICOAGULATION CONSULT NOTE  Pharmacy Consult for heparin infusion Indication: ACS/STEMI  No Known Allergies  Patient Measurements: Height: 5\' 3"  (160 cm) Weight: 69.4 kg (153 lb) IBW/kg (Calculated) : 52.4 Heparin Dosing Weight: 66.7 kg  Vital Signs: Temp: 100 F (37.8 C) (03/10 2345) Temp Source: Oral (03/10 2345) BP: 114/49 (03/10 2305) Pulse Rate: 107 (03/10 2305)  Labs: Recent Labs    11/07/23 0001 11/07/23 0002 11/07/23 0651 11/07/23 0934 11/07/23 1145 11/07/23 1342 11/07/23 1631 11/07/23 2309  HGB 11.4*  --  SPECIMEN CONTAMINATED, UNABLE TO PERFORM TEST(S).  --  11.0*  --   --   --   HCT 30.9*  --  SPECIMEN CONTAMINATED, UNABLE TO PERFORM TEST(S).  --  29.9*  --   --   --   PLT 123*  --  SPECIMEN CONTAMINATED, UNABLE TO PERFORM TEST(S).  --  128*  --   --   --   APTT 26  --   --  162*  --   --   --   --   LABPROT 13.9  --   --  15.6*  --   --   --   --   INR 1.0  --   --  1.2  --   --   --   --   HEPARINUNFRC  --   --   --  0.58  --   --   --  0.38  CREATININE  --  0.88  --  0.87  --  0.71  --   --   TROPONINIHS  --  5,416* 1,610*  --   --   --  3,144*  --    Estimated Creatinine Clearance: 54.2 mL/min (by C-G formula based on SCr of 0.71 mg/dL).  Medical History: Past Medical History:  Diagnosis Date   COVID-19 2021   GERD (gastroesophageal reflux disease)    Hypertension    Wears dentures    full upper and lower   Assessment: Pt is a 78 yo female presenting to ED w/ AMS, currently being treated for UTI, found with elevated Troponin I level and now evaluated for NSTEMI. Per chart review, patient is not on anticoagulation therapy at home. Pharmacy has been consulted to manage this patient's heparin.  Baseline Labs INR/PT 1.2/15.6 Plt 123 Hgb 11.4  Monitor drop in hgb 8.7   Goal of Therapy:  Heparin level 0.3-0.7 units/ml Monitor platelets by anticoagulation protocol: Yes  Heparin Levels Date/Time  HL  Clinical  Assessment 3/10@0934   0.58  Therapeutic x 1 3/10@2309                  0.38                Therapeutic X 2    Plan:  Continue heparin gtt at 850 units/hour Recheck HL on 3/11 with AM labs Continue to monitor CBC and platelets daily  Thank you for allowing pharmacy to participate in this patient's care.   Facundo Allemand D, PharmD 11/08/2023 12:20 AM

## 2023-11-08 NOTE — Progress Notes (Signed)
  Progress Note   Patient: Amanda Cruz ZOX:[id number] DOB: 04-23-1946 DOA: 11/06/2023     1 DOS: the patient was seen and examined on 11/08/2023   Brief hospital course: Amanda Cruz is a 78 y.o. Filipino female with medical history significant for GERD, hypertension sigmoid polyp and chronic venous insufficiency, who presented to the emergency room with acute onset of altered mental status with associated dyspnea and tactile fever.  She was recently diagnosed with UTI and went to urgent care on Friday.  Upon arriving to hospital, patient had significant tachycardia, tachypnea, elevated procalcitonin level, she also had a significant elevation troponin of 5416.  She developed severe hypotension, was sent to ICU for Levophed and fluid bolus.  She is also started on high-dose Rocephin for urinary tract infection and sepsis. She is also on heparin per cardiology.   Principal Problem:   Shock (HCC) Active Problems:   NSTEMI (non-ST elevated myocardial infarction) (HCC)   Hypokalemia   Hypomagnesemia   Essential hypertension   Dyslipidemia   Lung nodule   Hyponatremia   Assessment and Plan: *Septic shock with E. coli septicemia. Likely E. coli urinary tract infection. Patient initially arrived in the hospital with shock, this is a combination of septic shock and cardiogenic shock. Patient was on Levophed overnight, condition has improved. Patient can be transferred to cardiac unit for continued management. Continue Rocephin, repeat blood culture.  Urine culture was not sent out, had a positive blood culture most likely from urinary source.  Recheck procalcitonin level 36.54.  Cardiogenic shock secondary to non-STEMI. NSTEMI (non-ST elevated myocardial infarction) [facility]) Patient became hemodynamically stable, followed by cardiology.  Still on heparin drip, cardiac intervention is planned when condition is improved.  Hypokalemia Hyponatremia Hypomagnesemia. Continue replete  potassium,  Anemia with thrombocytopenia. This most likely secondary to severe sepsis.  Continue to follow.  Dyslipidemia - Statin dose was increased.  Essential hypertension Start metoprolol 25 mg daily as blood pressure has been better, patient developed sinus tachycardia.       Subjective:  Patient feel much better today, denies any short of breath.  No fever or chills.  No nausea vomiting.  Physical Exam: Vitals:   11/08/23 0500 11/08/23 0600 11/08/23 0700 11/08/23 0826  BP: (!) 117/55 (!) 112/50 127/67   Pulse: 85 80 89   Resp: (!) 21 17 20    Temp:      TempSrc:      SpO2: 100% 100% 100% 100%  Weight:      Height:       General exam: Appears calm and comfortable  Respiratory system: Clear to auscultation. Respiratory effort normal. Cardiovascular system: Irregular.  No JVD, murmurs, rubs, gallops or clicks. No pedal edema. Gastrointestinal system: Abdomen is nondistended, soft and nontender. No organomegaly or masses felt. Normal bowel sounds heard. Central nervous system: Alert and oriented. No focal neurological deficits. Extremities: Symmetric 5 x 5 power. Skin: No rashes, lesions or ulcers Psychiatry: Judgement and insight appear normal. Mood & affect appropriate.    Data Reviewed:  Lab results reviewed, CT scan results reviewed.  Family Communication: None  Disposition: Status is: Inpatient Remains inpatient appropriate because: Severity of disease, IV treatment.     Time spent: 60 minutes  Author: Marrion Coy, MD 11/08/2023 11:26 AM  For on call review [email or web address].

## 2023-11-08 NOTE — Progress Notes (Signed)
   Patient Name: Amanda Cruz Date of Encounter: 11/08/2023 [facility] Cardiologist: New  Interval Summary  .    Patient is in NSR. She reports she is feeling better. No chest pain reported. HS trop trending down.   Vital Signs .    Vitals:   11/08/23 0500 11/08/23 0600 11/08/23 0700 11/08/23 0826  BP: (!) 117/55 (!) 112/50 127/67   Pulse: 85 80 89   Resp: (!) 21 17 20    Temp:      TempSrc:      SpO2: 100% 100% 100% 100%  Weight:      Height:        Intake/Output Summary (Last 24 hours) at 11/08/2023 1013 Last data filed at 11/08/2023 0430 Gross per 24 hour  Intake 1893.86 ml  Output 1875 ml  Net 18.86 ml      11/06/2023   11:49 PM 09/06/2022    3:13 PM 08/05/2022    3:52 PM  Last 3 Weights  Weight (lbs) 153 lb 160 lb 160 lb 9.6 oz  Weight (kg) 69.4 kg 72.576 kg 72.848 kg      Telemetry/ECG    Afib?>Nsr HR 90-100 - Personally Reviewed  Physical Exam .   GEN: No acute distress.   Neck: No JVD Cardiac: RRR, no murmurs, rubs, or gallops.  Respiratory: Clear to auscultation bilaterally. GI: Soft, nontender, non-distended  MS: No edema  Assessment & Plan .    Shock/SEPSIS AMS - septic vs cardiogenic - IV levophed>wean as able - recently diagnosed with UTI as OP. UA here negative - Hs trop 5000>6000. No chest pain reported - chest CTA negative for PE - also with new tachyarrhythmia, MAT vs Afib   Tachyarrhythmia - initial EKG with afib vs atrial tachycardia - second EKG with ST and frequent PACs - tele shows possible Afib>NSR, will review with MD - now in NSR HR 90s - K2.4 and Mag 1.4 on arrival. Keep Mag>2 and K>4 - TSH WNL - echo showed LVEF 50-55% - I will start Lopressor 25mg  BID   NSTEMI - no chest pain reported - HS trop [phone] - EKG with no significant ischemic changes - may be supply demand mismatch in the setting of shock, sepsis, tachyarrhythmia - continue IV heparin x 48 hours - Echo showed LVEF 50-55%, no WMA, mod  LVH, normal RVSF, mod MR - continue ASA 81mg  daily, Lipitor 80mg  daily - patient will eventually require LHC   Electrolyte abnormality Hypokalemia/Hypomagnesemia/hyponatremia - K 2.6 >3.4 - Mag 1.4>2.6   HTN - PTA amlodipine and metoprolol held   For questions or updates, please contact [facility] Please consult [email or web address] for contact info under       Signed, Samreen Seltzer David Stall, PA-C

## 2023-11-08 NOTE — Evaluation (Signed)
 Physical Therapy Evaluation Patient Details Name: Amanda Cruz MRN: [id number] DOB: 1946/07/02 Today's Date: 11/08/2023  History of Present Illness  Pt is a 78 yo female that presented to ED for SOB, fever, AMS. Recent diagnosis with UTI, workup for elevated troponin, non-STEMI, septic shock. PMH of HTN, venous insufficiency, HLD, GERD.  Clinical Impression  Patient alert, agreeable to PT, seated in recliner. Pt oriented x4, denied pain. Per pt at baseline she is independent, takes care of her bedridden husband and will be having multiple family members move in. She was able to perform transfers with supervision and RW (BP assessed in standing WFLs). HR and spO2 WFLs during mobility. She ambulated ~127ft with RW and supervision, did report fatigue and appeared SOB after activity. Stated this does happen to her at home when attempting to ambulate longer distances. Pt up in chair at end of session, needs in reach.  Overall the patient demonstrated deficits (see "PT Problem List") that impede the patient's functional abilities, safety, and mobility and would benefit from skilled PT intervention.          If plan is discharge home, recommend the following: A little help with walking and/or transfers;A little help with bathing/dressing/bathroom;Assistance with cooking/housework;Help with stairs or ramp for entrance   Can travel by private vehicle        Equipment Recommendations  (pt has RW and BSC that belonged to her husband)  Recommendations for Other Services       Functional Status Assessment Patient has had a recent decline in their functional status and demonstrates the ability to make significant improvements in function in a reasonable and predictable amount of time.     Precautions / Restrictions Precautions Precautions: Fall Recall of Precautions/Restrictions: Intact Restrictions Weight Bearing Restrictions Per Provider Order: No      Mobility  Bed Mobility                General bed mobility comments: pt seated in recliner at start/end of session    Transfers Overall transfer level: Needs assistance Equipment used: Rolling walker (2 wheels) Transfers: Sit to/from Stand Sit to Stand: Supervision                Ambulation/Gait Ambulation/Gait assistance: Contact guard assist, Supervision Gait Distance (Feet): 150 Feet Assistive device: Rolling walker (2 wheels)         General Gait Details: pt reported improved comfort with RW, no LOB  Stairs            Wheelchair Mobility     Tilt Bed    Modified Rankin (Stroke Patients Only)       Balance Overall balance assessment: Needs assistance Sitting-balance support: Feet supported Sitting balance-Leahy Scale: Good       Standing balance-Leahy Scale: Fair                               Pertinent Vitals/Pain Pain Assessment Pain Assessment: No/denies pain    Home Living Family/patient expects to be discharged to:: Private residence Living Arrangements: Spouse/significant other Available Help at Discharge: Family;Other (Comment) (significant family moving in/already moved in) Type of Home: House Home Access: Stairs to enter Entrance Stairs-Rails: Right;Left;Can reach both Entrance Stairs-Number of Steps: 6   Home Layout: One level Home Equipment: Agricultural consultant (2 wheels)      Prior Function Prior Level of Function : Independent/Modified Independent  Mobility Comments: 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 ADLs Comments: loves to garden/tend her plants/flowers     Extremity/Trunk Assessment   Upper Extremity Assessment Upper Extremity Assessment: Overall WFL for tasks assessed    Lower Extremity Assessment Lower Extremity Assessment: Overall WFL for tasks assessed    Cervical / Trunk Assessment Cervical / Trunk Assessment: Normal  Communication        Cognition Arousal: Alert Behavior During Therapy: WFL for  tasks assessed/performed   PT - Cognitive impairments: No apparent impairments                                 Cueing       General Comments      Exercises     Assessment/Plan    PT Assessment Patient needs continued PT services  PT Problem List Decreased strength;Decreased activity tolerance;Decreased balance;Decreased mobility       PT Treatment Interventions DME instruction;Balance training;Gait training;Neuromuscular re-education;Stair training;Functional mobility training;Patient/family education;Therapeutic activities;Therapeutic exercise    PT Goals (Current goals can be found in the Care Plan section)  Acute Rehab PT Goals Patient Stated Goal: to go home PT Goal Formulation: With patient Time For Goal Achievement: 11/22/23 Potential to Achieve Goals: Good    Frequency Min 1X/week     Co-evaluation               AM-PAC PT "6 Clicks" Mobility  Outcome Measure Help needed turning from your back to your side while in a flat bed without using bedrails?: None Help needed moving from lying on your back to sitting on the side of a flat bed without using bedrails?: None Help needed moving to and from a bed to a chair (including a wheelchair)?: None Help needed standing up from a chair using your arms (e.g., wheelchair or bedside chair)?: A Little Help needed to walk in hospital room?: A Little Help needed climbing 3-5 steps with a railing? : A Little 6 Click Score: 21    End of Session Equipment Utilized During Treatment: Gait belt Activity Tolerance: Patient tolerated treatment well Patient left: in chair;with call bell/phone within reach Nurse Communication: Mobility status PT Visit Diagnosis: Other abnormalities of gait and mobility (R26.89);Difficulty in walking, not elsewhere classified (R26.2);Muscle weakness (generalized) (M62.81)    Time: 1610-9604 PT Time Calculation (min) (ACUTE ONLY): 16 min   Charges:   PT Evaluation $PT Eval  Low Complexity: 1 Low PT Treatments $Therapeutic Activity: 8-22 mins PT General Charges $$ ACUTE PT VISIT: 1 Visit         Olga Coaster PT, DPT 3:42 PM,11/08/23

## 2023-11-08 NOTE — Consult Note (Addendum)
 PHARMACY CONSULT NOTE - FOLLOW UP  Pharmacy Consult for Electrolyte Monitoring and Replacement   Recent Labs: Potassium (mmol/L)  Date Value  11/08/2023 3.2 (L)   Magnesium (mg/dL)  Date Value  95/62/1308 2.6 (H)   Calcium (mg/dL)  Date Value  65/78/4696 7.6 (L)   Albumin (g/dL)  Date Value  29/52/8413 2.7 (L)   Phosphorus (mg/dL)  Date Value  24/40/1027 2.8   Sodium (mmol/L)  Date Value  11/08/2023 132 (L)   Assessment: Amanda Cruz is a 78 yo female who presented to the ED 3/9 with altered mental status and shortness of breath with fever. They were diagnosed with a UTI 2 days ago and treated with Macrobid. Patient was admitted to the ICU due to requiring levophed. Pharmacy was consulted to manage this patient's electrolytes while they're in the ICU.   Goal of Therapy:  Potassium 4.0 - 5.1 mmol/L Magnesium 20.4 - 2.4 mg/dL All Other Electrolytes WNL  Plan:  K = 3.2: Replace with 40 mEq Kcl PO x 1 No other replacement indicated at this time Continue to follow electrolytes with AM labs  Thank you for allowing pharmacy to participate in this patient's care.   Burnis Medin, PharmD, BCPS 11/08/2023 6:23 AM

## 2023-11-08 NOTE — Progress Notes (Signed)
 ANTICOAGULATION CONSULT NOTE  Pharmacy Consult for heparin infusion Indication: ACS/STEMI  No Known Allergies  Patient Measurements: Height: 5\' 3"  (160 cm) Weight: 69.4 kg (153 lb) IBW/kg (Calculated) : 52.4 Heparin Dosing Weight: 66.7 kg  Vital Signs: Temp: 98.4 F (36.9 C) (03/11 0430) Temp Source: Oral (03/11 0430) BP: 117/61 (03/11 0400) Pulse Rate: 91 (03/11 0400)  Labs: Recent Labs    11/07/23 0001 11/07/23 0001 11/07/23 0002 11/07/23 0651 11/07/23 0934 11/07/23 1145 11/07/23 1342 11/07/23 1631 11/07/23 2309 11/08/23 0445  HGB 11.4*  --   --  SPECIMEN CONTAMINATED, UNABLE TO PERFORM TEST(S).  --  11.0*  --   --   --  10.0*  HCT 30.9*  --   --  SPECIMEN CONTAMINATED, UNABLE TO PERFORM TEST(S).  --  29.9*  --   --   --  27.1*  PLT 123*  --   --  SPECIMEN CONTAMINATED, UNABLE TO PERFORM TEST(S).  --  128*  --   --   --  127*  APTT 26  --   --   --  162*  --   --   --   --   --   LABPROT 13.9  --   --   --  15.6*  --   --   --   --   --   INR 1.0  --   --   --  1.2  --   --   --   --   --   HEPARINUNFRC  --   --   --   --  0.58  --   --   --  0.38 0.38  CREATININE  --    < > 0.88  --  0.87  --  0.71  --   --  0.82  TROPONINIHS  --   --  5,416* 7,846*  --   --   --  3,144*  --   --    < > = values in this interval not displayed.   Estimated Creatinine Clearance: 52.8 mL/min (by C-G formula based on SCr of 0.82 mg/dL).  Medical History: Past Medical History:  Diagnosis Date   COVID-19 2021   GERD (gastroesophageal reflux disease)    Hypertension    Wears dentures    full upper and lower   Assessment: Pt is a 78 yo female presenting to ED w/ AMS, currently being treated for UTI, found with elevated Troponin I level and now evaluated for NSTEMI. Per chart review, patient is not on anticoagulation therapy at home. Pharmacy has been consulted to manage this patient's heparin.  Baseline Labs INR/PT 1.2/15.6 Plt 123 Hgb 11.4  Monitor drop in hgb 8.7   Goal of  Therapy:  Heparin level 0.3-0.7 units/ml Monitor platelets by anticoagulation protocol: Yes  Heparin Levels Date/Time  HL  Clinical Assessment 3/10@0934   0.58  Therapeutic x 1 3/10@2309                  0.38                Therapeutic X 2  3/11@0445                  0.38                Therapeutic X 3    Plan:  Continue heparin gtt at 850 units/hour Recheck HL on 3/12 with AM labs Continue to monitor CBC and platelets daily  Thank you for allowing pharmacy  to participate in this patient's care.   Guila Owensby D, PharmD 11/08/2023 5:41 AM

## 2023-11-08 NOTE — Hospital Course (Signed)
 Amanda Cruz is a 78 y.o. Filipino female with medical history significant for GERD, hypertension sigmoid polyp and chronic venous insufficiency, who presented to the emergency room with acute onset of altered mental status with associated dyspnea and tactile fever.  She was recently diagnosed with UTI and went to urgent care on Friday.  Upon arriving to hospital, patient had significant tachycardia, tachypnea, elevated procalcitonin level, she also had a significant elevation troponin of 5416.  She developed severe hypotension, was sent to ICU for Levophed and fluid bolus.  She is also started on high-dose Rocephin for urinary tract infection and sepsis. She is also on heparin per cardiology.

## 2023-11-09 DIAGNOSIS — A419 Sepsis, unspecified organism: Secondary | ICD-10-CM | POA: Diagnosis not present

## 2023-11-09 DIAGNOSIS — A4151 Sepsis due to Escherichia coli [E. coli]: Secondary | ICD-10-CM | POA: Diagnosis not present

## 2023-11-09 DIAGNOSIS — I214 Non-ST elevation (NSTEMI) myocardial infarction: Secondary | ICD-10-CM | POA: Diagnosis not present

## 2023-11-09 DIAGNOSIS — R6521 Severe sepsis with septic shock: Secondary | ICD-10-CM | POA: Diagnosis not present

## 2023-11-09 DIAGNOSIS — E876 Hypokalemia: Secondary | ICD-10-CM | POA: Diagnosis not present

## 2023-11-09 DIAGNOSIS — I471 Supraventricular tachycardia, unspecified: Secondary | ICD-10-CM

## 2023-11-09 LAB — BASIC METABOLIC PANEL
Anion gap: 9 (ref 5–15)
BUN: 13 mg/dL (ref 8–23)
CO2: 22 mmol/L (ref 22–32)
Calcium: 7.8 mg/dL — ABNORMAL LOW (ref 8.9–10.3)
Chloride: 103 mmol/L (ref 98–111)
Creatinine, Ser: 0.75 mg/dL (ref 0.44–1.00)
GFR, Estimated: >60 mL/min (ref >60)
Glucose, Bld: 134 mg/dL — ABNORMAL HIGH (ref 70–99)
Potassium: 3.7 mmol/L (ref 3.5–5.1)
Sodium: 134 mmol/L — ABNORMAL LOW (ref 135–145)

## 2023-11-09 LAB — CULTURE, BLOOD (ROUTINE X 2): Special Requests: ADEQUATE

## 2023-11-09 LAB — MAGNESIUM: Magnesium: 2.3 mg/dL (ref 1.7–2.4)

## 2023-11-09 LAB — CBC
HCT: 26.5 % — ABNORMAL LOW (ref 36.0–46.0)
Hemoglobin: 9.5 g/dL — ABNORMAL LOW (ref 12.0–15.0)
MCH: 31.1 pg (ref 26.0–34.0)
MCHC: 35.8 g/dL (ref 30.0–36.0)
MCV: 86.9 fL (ref 80.0–100.0)
Platelets: 162 10*3/uL (ref 150–400)
RBC: 3.05 MIL/uL — ABNORMAL LOW (ref 3.87–5.11)
RDW: 12.2 % (ref 11.5–15.5)
WBC: 9.1 10*3/uL (ref 4.0–10.5)
nRBC: 0 % (ref 0.0–0.2)

## 2023-11-09 LAB — PHOSPHORUS: Phosphorus: 2.4 mg/dL — ABNORMAL LOW (ref 2.5–4.6)

## 2023-11-09 LAB — HEPARIN LEVEL (UNFRACTIONATED)
Heparin Unfractionated: 0.2 [IU]/mL — ABNORMAL LOW (ref 0.30–0.70)
Heparin Unfractionated: 0.26 [IU]/mL — ABNORMAL LOW (ref 0.30–0.70)

## 2023-11-09 LAB — LIPOPROTEIN A (LPA): Lipoprotein (a): 28.6 nmol/L (ref <75.0)

## 2023-11-09 MED ORDER — IPRATROPIUM-ALBUTEROL 0.5-2.5 (3) MG/3ML IN SOLN
3.0000 mL | Freq: Four times a day (QID) | RESPIRATORY_TRACT | Status: DC | PRN
  Administered 2023-11-09: 3 mL via RESPIRATORY_TRACT
  Filled 2023-11-09: qty 3

## 2023-11-09 MED ORDER — FUROSEMIDE 10 MG/ML IJ SOLN
40.0000 mg | Freq: Once | INTRAMUSCULAR | Status: AC
  Administered 2023-11-09: 40 mg via INTRAVENOUS
  Filled 2023-11-09: qty 4

## 2023-11-09 MED ORDER — HEPARIN BOLUS VIA INFUSION
1000.0000 [IU] | Freq: Once | INTRAVENOUS | Status: AC
  Administered 2023-11-09: 1000 [IU] via INTRAVENOUS
  Filled 2023-11-09: qty 1000

## 2023-11-09 MED ORDER — METOPROLOL TARTRATE 25 MG PO TABS
25.0000 mg | ORAL_TABLET | ORAL | Status: AC
  Administered 2023-11-09: 25 mg via ORAL
  Filled 2023-11-09: qty 1

## 2023-11-09 MED ORDER — HEPARIN (PORCINE) 25000 UT/250ML-% IV SOLN
1050.0000 [IU]/h | INTRAVENOUS | Status: DC
  Administered 2023-11-09: 1050 [IU]/h via INTRAVENOUS

## 2023-11-09 MED ORDER — METOPROLOL TARTRATE 50 MG PO TABS
50.0000 mg | ORAL_TABLET | Freq: Two times a day (BID) | ORAL | Status: DC
  Administered 2023-11-09 – 2023-11-12 (×6): 50 mg via ORAL
  Filled 2023-11-09 (×6): qty 1

## 2023-11-09 MED ORDER — ASPIRIN 81 MG PO TBEC
81.0000 mg | DELAYED_RELEASE_TABLET | Freq: Every day | ORAL | Status: DC
  Administered 2023-11-11 – 2023-11-12 (×2): 81 mg via ORAL
  Filled 2023-11-09 (×2): qty 1

## 2023-11-09 MED ORDER — POTASSIUM CHLORIDE CRYS ER 20 MEQ PO TBCR
40.0000 meq | EXTENDED_RELEASE_TABLET | Freq: Once | ORAL | Status: AC
  Administered 2023-11-09: 40 meq via ORAL
  Filled 2023-11-09: qty 2

## 2023-11-09 MED ORDER — SODIUM CHLORIDE 0.9 % IV SOLN: INTRAVENOUS | Status: AC

## 2023-11-09 MED ORDER — LEVALBUTEROL HCL 0.63 MG/3ML IN NEBU: 0.6300 mg | INHALATION_SOLUTION | Freq: Four times a day (QID) | RESPIRATORY_TRACT | Status: DC | PRN

## 2023-11-09 MED ORDER — ASPIRIN 81 MG PO CHEW
81.0000 mg | CHEWABLE_TABLET | ORAL | Status: AC
  Administered 2023-11-10: 81 mg via ORAL
  Filled 2023-11-09: qty 1

## 2023-11-09 MED ORDER — MORPHINE SULFATE (PF) 2 MG/ML IV SOLN
2.0000 mg | Freq: Once | INTRAVENOUS | Status: AC
  Administered 2023-11-09: 2 mg via INTRAVENOUS
  Filled 2023-11-09: qty 1

## 2023-11-09 NOTE — Progress Notes (Signed)
 ANTICOAGULATION CONSULT NOTE  Pharmacy Consult for heparin infusion Indication: ACS/STEMI  No Known Allergies  Patient Measurements: Height: 5\' 3"  (160 cm) Weight: 69.4 kg (153 lb) IBW/kg (Calculated) : 52.4 Heparin Dosing Weight: 66.7 kg  Vital Signs: Temp: 98.6 F (37 C) (03/12 0523) Temp Source: Oral (03/12 0523) BP: 106/46 (03/12 0523) Pulse Rate: 87 (03/12 0523)  Labs: Recent Labs    11/07/23 0001 11/07/23 0001 11/07/23 0002 11/07/23 0002 11/07/23 0651 11/07/23 0934 11/07/23 1145 11/07/23 1342 11/07/23 1631 11/07/23 2309 11/08/23 0445 11/09/23 0514  HGB 11.4*  --   --   --  SPECIMEN CONTAMINATED, UNABLE TO PERFORM TEST(S).  --  11.0*  --   --   --  10.0* 9.5*  HCT 30.9*  --   --   --  SPECIMEN CONTAMINATED, UNABLE TO PERFORM TEST(S).  --  29.9*  --   --   --  27.1* 26.5*  PLT 123*  --   --   --  SPECIMEN CONTAMINATED, UNABLE TO PERFORM TEST(S).  --  128*  --   --   --  127* 162  APTT 26  --   --   --   --  162*  --   --   --   --   --   --   LABPROT 13.9  --   --   --   --  15.6*  --   --   --   --   --   --   INR 1.0  --   --   --   --  1.2  --   --   --   --   --   --   HEPARINUNFRC  --   --   --    < >  --  0.58  --   --   --  0.38 0.38 0.20*  CREATININE  --    < > 0.88  --   --  0.87  --  0.71  --   --  0.82 0.75  TROPONINIHS  --   --  1,610*  --  6,095*  --   --   --  3,144*  --   --   --    < > = values in this interval not displayed.   Estimated Creatinine Clearance: 54.2 mL/min (by C-G formula based on SCr of 0.75 mg/dL).  Medical History: Past Medical History:  Diagnosis Date   COVID-19 2021   GERD (gastroesophageal reflux disease)    Hypertension    Wears dentures    full upper and lower   Assessment: Pt is a 78 yo female presenting to ED w/ AMS, currently being treated for UTI, found with elevated Troponin I level and now evaluated for NSTEMI. Per chart review, patient is not on anticoagulation therapy at home. Pharmacy has been consulted to  manage this patient's heparin.  Baseline Labs INR/PT 1.2/15.6 Plt 123 Hgb 11.4  Monitor drop in hgb 8.7   Goal of Therapy:  Heparin level 0.3-0.7 units/ml Monitor platelets by anticoagulation protocol: Yes  Heparin Levels Date/Time  HL  Clinical Assessment 3/10@0934   0.58  Therapeutic x 1 3/10@2309                  0.38                Therapeutic X 2  3/11@0445                  0.38  Therapeutic X 3  3/12@0514                  0.20                SUBtherapeutic    Plan:  3/12:  HL @ 0514 = 0.20, SUBtherapeutic - Will order heparin 1000 units IV X 1 bolus and increased drip rate to 950 units/hr - Will recheck HL 8 hrs after rate change  Thank you for allowing pharmacy to participate in this patient's care.   Artesha Wemhoff D, PharmD 11/09/2023 6:12 AM

## 2023-11-09 NOTE — Progress Notes (Addendum)
 Progress Note    [name]  WGN:[id number] DOB: May 18, 1946  DOA: 11/06/2023 PCP: Corky Downs, MD      Brief Narrative:    Medical records reviewed and are as summarized below:  Amanda Cruz is a 78 y.o. female with medical history significant for GERD, hypertension sigmoid polyp and chronic venous insufficiency, who presented to the emergency room with acute onset of altered mental status with associated dyspnea and fever.  She was recently diagnosed with UTI and went to urgent care on Friday.  Upon arriving to hospital, patient had significant tachycardia, tachypnea, elevated procalcitonin level, she also had a significant elevation troponin of 5416.  She developed severe hypotension, was sent to ICU for Levophed and fluid bolus.  She is also started on high-dose Rocephin for urinary tract infection and sepsis.  She was also treated with IV heparin drip for acute NSTEMI.     Assessment/Plan:   Principal Problem:   Septic shock (HCC) Active Problems:   NSTEMI (non-ST elevated myocardial infarction) (HCC)   Hypokalemia   Hypomagnesemia   Essential hypertension   Dyslipidemia   Lung nodule   Hyponatremia   E. coli septicemia (HCC)   SVT (supraventricular tachycardia) (HCC)   Body mass index is 27.1 kg/m.    Septic shock,E. coli bacteremia, acute UTI: Continue IV ceftriaxone   Acute NSTEMI: Discontinue IV heparin drip.  Cardiologist recommended 48 hours of IV heparin.  Continue aspirin and Lipitor.  Plan for left heart cath. Cardiogenic shock felt to be unlikely   Paroxysmal tachycardia/SVT: She converted to NSR.  Continue metoprolol.   Hypokalemia, hyponatremia and hypomagnesemia: Potassium and Magnesium levels have improved.  Continue potassium repletion.  Sodium level is still low although it has improved.   Anemia with thrombocytopenia: Probably from acute illness.  Monitor CBC.   Comorbidities include dyslipidemia, hypertension    Diet  Order             Diet NPO time specified  Diet effective midnight           Diet Heart Room service appropriate? Yes; Fluid consistency: Thin  Diet effective now                            Consultants: Cardiologist  Procedures: None    Medications:    Ferrous Fumarate  1 tablet Oral Daily   And   ascorbic acid  250 mg Oral Daily   aspirin EC  81 mg Oral Daily   atorvastatin  80 mg Oral Daily   budesonide (PULMICORT) nebulizer solution  0.25 mg Nebulization BID   Chlorhexidine Gluconate Cloth  6 each Topical Daily   cholecalciferol  1,000 Units Oral Daily   furosemide  40 mg Intravenous Once   metoprolol tartrate  25 mg Oral NOW   metoprolol tartrate  50 mg Oral BID   pantoprazole (PROTONIX) IV  40 mg Intravenous Q12H   potassium chloride  40 mEq Oral Once   Continuous Infusions:  cefTRIAXone (ROCEPHIN)  IV 2 g (11/09/23 0008)     Anti-infectives (From admission, onward)    Start     Dose/Rate Route Frequency Ordered Stop   11/07/23 2000  cefTRIAXone (ROCEPHIN) 2 g in sodium chloride 0.9 % 100 mL IVPB        2 g 200 mL/hr over 30 Minutes Intravenous Every 24 hours 11/07/23 1225     11/07/23 1800  ceFEPIme (MAXIPIME) 1 g  in sodium chloride 0.9 % 100 mL IVPB  Status:  Discontinued        1 g 200 mL/hr over 30 Minutes Intravenous Every 12 hours 11/07/23 0523 11/07/23 0527   11/07/23 1200  ceFEPIme (MAXIPIME) 1 g in sodium chloride 0.9 % 100 mL IVPB  Status:  Discontinued        1 g 200 mL/hr over 30 Minutes Intravenous Every 12 hours 11/07/23 0527 11/07/23 1059   11/07/23 1200  ceFEPIme (MAXIPIME) 2 g in sodium chloride 0.9 % 100 mL IVPB  Status:  Discontinued        2 g 200 mL/hr over 30 Minutes Intravenous Every 12 hours 11/07/23 1059 11/07/23 1225   11/07/23 0530  ceFEPIme (MAXIPIME) 2 g in sodium chloride 0.9 % 100 mL IVPB  Status:  Discontinued        2 g 200 mL/hr over 30 Minutes Intravenous  Once 11/07/23 0519 11/07/23 0527   11/07/23 0530   ceFEPIme (MAXIPIME) 2 g in sodium chloride 0.9 % 100 mL IVPB  Status:  Discontinued        2 g 200 mL/hr over 30 Minutes Intravenous  Once 11/07/23 0526 11/07/23 0527   11/07/23 0215  cefTRIAXone (ROCEPHIN) 2 g in sodium chloride 0.9 % 100 mL IVPB        2 g 200 mL/hr over 30 Minutes Intravenous  Once 11/07/23 0209 11/07/23 0341              Family Communication/Anticipated D/C date and plan/Code Status   DVT prophylaxis:      Code Status: Full Code  Family Communication: None Disposition Plan: Plan to discharge home   Status is: Inpatient Remains inpatient appropriate because: NSTEMI, E. coli bacteremia       Subjective:   Interval events noted.  She complains of "chest feels heavy".  No shortness of breath or pain.  Objective:    Vitals:   11/09/23 0225 11/09/23 0229 11/09/23 0523 11/09/23 0837  BP: (!) 161/80 (!) 143/65 (!) 106/46 131/69  Pulse: (!) 120 (!) 119 87 97  Resp:   19   Temp:   98.6 F (37 C) 97.7 F (36.5 C)  TempSrc:   Oral   SpO2: 95% 93% 99% 98%  Weight:      Height:       No data found.   Intake/Output Summary (Last 24 hours) at 11/09/2023 1236 Last data filed at 11/09/2023 0620 Gross per 24 hour  Intake 462 ml  Output 500 ml  Net -38 ml   Filed Weights   11/06/23 2349  Weight: 69.4 kg    Exam:  GEN: NAD SKIN: Warm and dry EYES: No pallor or icterus ENT: MMM CV: RRR PULM: CTA B ABD: soft, ND, NT, +BS CNS: AAO x 3, non focal EXT: No edema or tenderness        Data Reviewed:   I have personally reviewed following labs and imaging studies:  Labs: Labs show the following:   Basic Metabolic Panel: Recent Labs  Lab 11/07/23 0001 11/07/23 0002 11/07/23 0002 11/07/23 0639 11/07/23 0934 11/07/23 1342 11/07/23 1806 11/08/23 0445 11/09/23 0514  NA  --  125*  --   --  130* 131*  --  132* 134*  K  --  2.6*   < >  --  3.3* 4.1 4.0 3.2* 3.7  CL  --  92*  --   --  96* 100  --  100 103  CO2  --  22  --   --   22 19*  --  22 22  GLUCOSE  --  153*  --   --  196* 131*  --  132* 134*  BUN  --  12  --   --  11 10  --  11 13  CREATININE  --  0.88  --   --  0.87 0.71  --  0.82 0.75  CALCIUM  --  8.1*  --   --  7.5* 7.6*  --  7.6* 7.8*  MG 1.4*  --   --   --   --   --  2.8* 2.6* 2.3  PHOS  --   --   --  1.8*  --   --   --  2.8 2.4*   < > = values in this interval not displayed.   GFR Estimated Creatinine Clearance: 54.2 mL/min (by C-G formula based on SCr of 0.75 mg/dL). Liver Function Tests: Recent Labs  Lab 11/07/23 0002 11/07/23 0934  AST 43* 83*  ALT 29 42  ALKPHOS 62 89  BILITOT 0.7 0.5  PROT 6.1* 5.8*  ALBUMIN 3.2* 2.7*   No results for input(s): "LIPASE", "AMYLASE" in the last 168 hours. No results for input(s): "AMMONIA" in the last 168 hours. Coagulation profile Recent Labs  Lab 11/07/23 0001 11/07/23 0934  INR 1.0 1.2    CBC: Recent Labs  Lab 11/07/23 0001 11/07/23 0651 11/07/23 1145 11/08/23 0445 11/09/23 0514  WBC 4.2 SPECIMEN CONTAMINATED, UNABLE TO PERFORM TEST(S). 11.4* 7.1 9.1  NEUTROABS 3.9 SPECIMEN CONTAMINATED, UNABLE TO PERFORM TEST(S).  --   --   --   HGB 11.4* SPECIMEN CONTAMINATED, UNABLE TO PERFORM TEST(S). 11.0* 10.0* 9.5*  HCT 30.9* SPECIMEN CONTAMINATED, UNABLE TO PERFORM TEST(S). 29.9* 27.1* 26.5*  MCV 83.7 SPECIMEN CONTAMINATED, UNABLE TO PERFORM TEST(S). 84.7 83.6 86.9  PLT 123* SPECIMEN CONTAMINATED, UNABLE TO PERFORM TEST(S). 128* 127* 162   Cardiac Enzymes: No results for input(s): "CKTOTAL", "CKMB", "CKMBINDEX", "TROPONINI" in the last 168 hours. BNP (last 3 results) No results for input(s): "PROBNP" in the last 8760 hours. CBG: Recent Labs  Lab 11/07/23 0842  GLUCAP 185*   D-Dimer: No results for input(s): "DDIMER" in the last 72 hours. Hgb A1c: No results for input(s): "HGBA1C" in the last 72 hours. Lipid Profile: No results for input(s): "CHOL", "HDL", "LDLCALC", "TRIG", "CHOLHDL", "LDLDIRECT" in the last 72 hours. Thyroid  function studies: Recent Labs    11/07/23 0934  TSH 1.843   Anemia work up: No results for input(s): "VITAMINB12", "FOLATE", "FERRITIN", "TIBC", "IRON", "RETICCTPCT" in the last 72 hours. Sepsis Labs: Recent Labs  Lab 11/07/23 0001 11/07/23 0002 11/07/23 0651 11/07/23 0934 11/07/23 1145 11/08/23 0445 11/09/23 0514  PROCALCITON  --  2.12  --   --   --  36.54  --   WBC 4.2  --  SPECIMEN CONTAMINATED, UNABLE TO PERFORM TEST(S).  --  11.4* 7.1 9.1  LATICACIDVEN 1.4  --  1.3 1.9 1.7  --   --     Microbiology Recent Results (from the past 240 hours)  Blood Culture (routine x 2)     Status: Abnormal (Preliminary result)   Collection Time: 11/07/23 12:03 AM   Specimen: BLOOD RIGHT ARM  Result Value Ref Range Status   Specimen Description   Final    BLOOD RIGHT ARM Performed at [facility], [address], [place], [place] [address]    Special Requests   Final  BOTTLES DRAWN AEROBIC AND ANAEROBIC Blood Culture adequate volume Performed at [facility], [address] [place], [place] [address]    Culture  Setup Time   Final    GRAM NEGATIVE RODS IN BOTH AEROBIC AND ANAEROBIC BOTTLES CRITICAL VALUE NOTED.  VALUE IS CONSISTENT WITH PREVIOUSLY REPORTED AND CALLED VALUE. Performed at [facility], [address] [place], [place] [address]    Culture (A)  Final    ESCHERICHIA COLI SUSCEPTIBILITIES PERFORMED ON PREVIOUS CULTURE WITHIN THE LAST 5 DAYS. Performed at [facility], [address] [place], [place] [address]    Report Status PENDING  Incomplete  Blood Culture (routine x 2)     Status: Abnormal   Collection Time: 11/07/23 12:03 AM   Specimen: BLOOD LEFT ARM  Result Value Ref Range Status   Specimen Description   Final    BLOOD LEFT ARM Performed at [facility], [address], [place], [place] [address]    Special Requests   Final    BOTTLES DRAWN AEROBIC AND ANAEROBIC Blood Culture results may not be optimal  due to an inadequate volume of blood received in culture bottles Performed at [facility], [address] [place], [place] [address]    Culture  Setup Time   Final    GRAM NEGATIVE RODS IN BOTH AEROBIC AND ANAEROBIC BOTTLES CRITICAL RESULT CALLED TO, READ BACK BY AND VERIFIED WITHMila Merry PHARMD 1132 11/07/23 HNM Performed at [facility], [address] [place], [place] [address]    Culture ESCHERICHIA COLI (A)  Final   Report Status 11/09/2023 FINAL  Final   Organism ID, Bacteria ESCHERICHIA COLI  Final   Organism ID, Bacteria ESCHERICHIA COLI  Final      Susceptibility   Escherichia coli - KIRBY BAUER*    CEFAZOLIN SENSITIVE Sensitive    Escherichia coli - MIC*    AMPICILLIN <=2 SENSITIVE Sensitive     CEFEPIME <=0.12 SENSITIVE Sensitive     CEFTAZIDIME <=1 SENSITIVE Sensitive     CEFTRIAXONE <=0.25 SENSITIVE Sensitive     CIPROFLOXACIN <=0.25 SENSITIVE Sensitive     GENTAMICIN <=1 SENSITIVE Sensitive     IMIPENEM <=0.25 SENSITIVE Sensitive     TRIMETH/SULFA <=20 SENSITIVE Sensitive     AMPICILLIN/SULBACTAM <=2 SENSITIVE Sensitive     PIP/TAZO <=4 SENSITIVE Sensitive ug/mL    * ESCHERICHIA COLI    ESCHERICHIA COLI  Blood Culture ID Panel (Reflexed)     Status: Abnormal   Collection Time: 11/07/23 12:03 AM  Result Value Ref Range Status   Enterococcus faecalis NOT DETECTED NOT DETECTED Final   Enterococcus Faecium NOT DETECTED NOT DETECTED Final   Listeria monocytogenes NOT DETECTED NOT DETECTED Final   Staphylococcus species NOT DETECTED NOT DETECTED Final   Staphylococcus aureus (BCID) NOT DETECTED NOT DETECTED Final   Staphylococcus epidermidis NOT DETECTED NOT DETECTED Final   Staphylococcus lugdunensis NOT DETECTED NOT DETECTED Final   Streptococcus species NOT DETECTED NOT DETECTED Final   Streptococcus agalactiae NOT DETECTED NOT DETECTED Final   Streptococcus pneumoniae NOT DETECTED NOT DETECTED Final   Streptococcus pyogenes NOT DETECTED  NOT DETECTED Final   A.calcoaceticus-baumannii NOT DETECTED NOT DETECTED Final   Bacteroides fragilis NOT DETECTED NOT DETECTED Final   Enterobacterales DETECTED (A) NOT DETECTED Final    Comment: Enterobacterales represent a large order of gram negative bacteria, not a single organism. CRITICAL RESULT CALLED TO, READ BACK BY AND VERIFIED WITHMila Merry PHARMD [phone] 11/07/23  HNM    Enterobacter cloacae complex NOT DETECTED NOT DETECTED Final   Escherichia coli DETECTED (A) NOT DETECTED Final    Comment: CRITICAL RESULT CALLED TO, READ BACK BY AND VERIFIED WITH: Mila Merry PHARMD 1132 11/07/23 HNM    Klebsiella aerogenes NOT DETECTED NOT DETECTED Final   Klebsiella oxytoca NOT DETECTED NOT DETECTED Final   Klebsiella pneumoniae NOT DETECTED NOT DETECTED Final   Proteus species NOT DETECTED NOT DETECTED Final   Salmonella species NOT DETECTED NOT DETECTED Final   Serratia marcescens NOT DETECTED NOT DETECTED Final   Haemophilus influenzae NOT DETECTED NOT DETECTED Final   Neisseria meningitidis NOT DETECTED NOT DETECTED Final   Pseudomonas aeruginosa NOT DETECTED NOT DETECTED Final   Stenotrophomonas maltophilia NOT DETECTED NOT DETECTED Final   Candida albicans NOT DETECTED NOT DETECTED Final   Candida auris NOT DETECTED NOT DETECTED Final   Candida glabrata NOT DETECTED NOT DETECTED Final   Candida krusei NOT DETECTED NOT DETECTED Final   Candida parapsilosis NOT DETECTED NOT DETECTED Final   Candida tropicalis NOT DETECTED NOT DETECTED Final   Cryptococcus neoformans/gattii NOT DETECTED NOT DETECTED Final   CTX-M ESBL NOT DETECTED NOT DETECTED Final   Carbapenem resistance IMP NOT DETECTED NOT DETECTED Final   Carbapenem resistance KPC NOT DETECTED NOT DETECTED Final   Carbapenem resistance NDM NOT DETECTED NOT DETECTED Final   Carbapenem resist OXA 48 LIKE NOT DETECTED NOT DETECTED Final   Carbapenem resistance VIM NOT DETECTED NOT DETECTED Final    Comment: Performed at  [facility], [address] [place], [place] [address]  Resp panel by RT-PCR (RSV, Flu A&B, Covid) Anterior Nasal Swab     Status: None   Collection Time: 11/07/23  6:39 AM   Specimen: Anterior Nasal Swab  Result Value Ref Range Status   SARS Coronavirus 2 by RT PCR NEGATIVE NEGATIVE Final    Comment: (NOTE) SARS-CoV-2 target nucleic acids are NOT DETECTED.  The SARS-CoV-2 RNA is generally detectable in upper respiratory specimens during the acute phase of infection. The lowest concentration of SARS-CoV-2 viral copies this assay can detect is 138 copies/mL. A negative result does not preclude SARS-Cov-2 infection and should not be used as the sole basis for treatment or other patient management decisions. A negative result may occur with  improper specimen collection/handling, submission of specimen other than nasopharyngeal swab, presence of viral mutation(s) within the areas targeted by this assay, and inadequate number of viral copies(<138 copies/mL). A negative result must be combined with clinical observations, patient history, and epidemiological information. The expected result is Negative.  Fact Sheet for Patients:  BloggerCourse.com  Fact Sheet for Healthcare Providers:  SeriousBroker.it  This test is no t yet approved or cleared by the [place] FDA and  has been authorized for detection and/or diagnosis of SARS-CoV-2 by FDA under an Emergency Use Authorization (EUA). This EUA will remain  in effect (meaning this test can be used) for the duration of the COVID-19 declaration under Section 564(b)(1) of the Act, 21 U.S.C.section 360bbb-3(b)(1), unless the authorization is terminated  or revoked sooner.       Influenza A by PCR NEGATIVE NEGATIVE Final   Influenza B by PCR NEGATIVE NEGATIVE Final    Comment: (NOTE) The Xpert Xpress SARS-CoV-2/FLU/RSV plus assay is intended as an aid in the diagnosis of  influenza from Nasopharyngeal swab specimens and should not be used as a sole basis for treatment. Nasal washings and aspirates are unacceptable for Xpert Xpress SARS-CoV-2/FLU/RSV testing.  Fact Sheet for Patients: BloggerCourse.com  Fact Sheet for Healthcare Providers: SeriousBroker.it  This test is not yet approved or cleared by the [place] FDA and has been authorized for detection and/or diagnosis of SARS-CoV-2 by FDA under an Emergency Use Authorization (EUA). This EUA will remain in effect (meaning this test can be used) for the duration of the COVID-19 declaration under Section 564(b)(1) of the Act, 21 U.S.C. section 360bbb-3(b)(1), unless the authorization is terminated or revoked.     Resp Syncytial Virus by PCR NEGATIVE NEGATIVE Final    Comment: (NOTE) Fact Sheet for Patients: BloggerCourse.com  Fact Sheet for Healthcare Providers: SeriousBroker.it  This test is not yet approved or cleared by the [place] FDA and has been authorized for detection and/or diagnosis of SARS-CoV-2 by FDA under an Emergency Use Authorization (EUA). This EUA will remain in effect (meaning this test can be used) for the duration of the COVID-19 declaration under Section 564(b)(1) of the Act, 21 U.S.C. section 360bbb-3(b)(1), unless the authorization is terminated or revoked.  Performed at [facility], [address] [place], [place] [address]   Respiratory (~20 pathogens) panel by PCR     Status: None   Collection Time: 11/07/23  9:20 AM   Specimen: Nasopharyngeal Swab; Respiratory  Result Value Ref Range Status   Adenovirus NOT DETECTED NOT DETECTED Final   Coronavirus 229E NOT DETECTED NOT DETECTED Final    Comment: (NOTE) The Coronavirus on the Respiratory Panel, DOES NOT test for the novel  Coronavirus (2019 nCoV)    Coronavirus HKU1 NOT DETECTED NOT  DETECTED Final   Coronavirus NL63 NOT DETECTED NOT DETECTED Final   Coronavirus OC43 NOT DETECTED NOT DETECTED Final   Metapneumovirus NOT DETECTED NOT DETECTED Final   Rhinovirus / Enterovirus NOT DETECTED NOT DETECTED Final   Influenza A NOT DETECTED NOT DETECTED Final   Influenza B NOT DETECTED NOT DETECTED Final   Parainfluenza Virus 1 NOT DETECTED NOT DETECTED Final   Parainfluenza Virus 2 NOT DETECTED NOT DETECTED Final   Parainfluenza Virus 3 NOT DETECTED NOT DETECTED Final   Parainfluenza Virus 4 NOT DETECTED NOT DETECTED Final   Respiratory Syncytial Virus NOT DETECTED NOT DETECTED Final   Bordetella pertussis NOT DETECTED NOT DETECTED Final   Bordetella Parapertussis NOT DETECTED NOT DETECTED Final   Chlamydophila pneumoniae NOT DETECTED NOT DETECTED Final   Mycoplasma pneumoniae NOT DETECTED NOT DETECTED Final    Comment: Performed at [facility], [address], [place], [place] [address]  Culture, blood (x 2)     Status: None (Preliminary result)   Collection Time: 11/07/23  9:34 AM   Specimen: BLOOD  Result Value Ref Range Status   Specimen Description BLOOD RIGHT ANTECUBITAL  Final   Special Requests   Final    BOTTLES DRAWN AEROBIC ONLY Blood Culture adequate volume   Culture   Final    NO GROWTH 2 DAYS Performed at [facility], [address], [place], [place] [address]    Report Status PENDING  Incomplete  Culture, blood (x 2)     Status: None (Preliminary result)   Collection Time: 11/07/23  9:34 AM   Specimen: BLOOD  Result Value Ref Range Status   Specimen Description BLOOD BLOOD RIGHT ARM  Final   Special Requests   Final    BOTTLES DRAWN AEROBIC AND ANAEROBIC Blood Culture results may not be optimal due to an inadequate volume of blood received in culture bottles   Culture  Final    NO GROWTH 2 DAYS Performed at [facility], [address] [place], [place] [address]    Report Status PENDING  Incomplete  MRSA Next Gen by  PCR, Nasal     Status: None   Collection Time: 11/07/23 12:35 PM   Specimen: Nasal Mucosa; Nasal Swab  Result Value Ref Range Status   MRSA by PCR Next Gen NOT DETECTED NOT DETECTED Final    Comment: (NOTE) The GeneXpert MRSA Assay (FDA approved for NASAL specimens only), is one component of a comprehensive MRSA colonization surveillance program. It is not intended to diagnose MRSA infection nor to guide or monitor treatment for MRSA infections. Test performance is not FDA approved in patients less than 66 years old. Performed at [facility], [address] [place], [place] [address]   C Difficile Quick Screen w PCR reflex     Status: None   Collection Time: 11/07/23  4:11 PM   Specimen: STOOL  Result Value Ref Range Status   C Diff antigen NEGATIVE NEGATIVE Final   C Diff toxin NEGATIVE NEGATIVE Final   C Diff interpretation No C. difficile detected.  Final    Comment: Performed at [facility], [address] [place], [place] [address]    Procedures and diagnostic studies:  CT ABDOMEN PELVIS WO CONTRAST Result Date: 11/07/2023 CLINICAL DATA:  Sepsis EXAM: CT ABDOMEN AND PELVIS WITHOUT CONTRAST TECHNIQUE: Multidetector CT imaging of the abdomen and pelvis was performed following the standard protocol without IV contrast. RADIATION DOSE REDUCTION: This exam was performed according to the departmental dose-optimization program which includes automated exposure control, adjustment of the mA and/or kV according to patient size and/or use of iterative reconstruction technique. COMPARISON:  11/07/2023 chest CT FINDINGS: Lower chest: Trace bilateral pleural effusions have developed in the interim. No acute airspace disease. Hepatobiliary: Unremarkable unenhanced appearance of the liver and gallbladder. Pancreas: Unremarkable unenhanced appearance. Spleen: Unremarkable unenhanced appearance. Adrenals/Urinary Tract: No urinary tract calculi or obstructive uropathy within  either kidney. Right adrenal calcifications likely reflect prior infection or hemorrhage. Left adrenal is unremarkable. Bladder is moderately distended without filling defect. Stomach/Bowel: No bowel obstruction or ileus. Normal appendix right lower quadrant. Diverticulosis of the descending and sigmoid colon. No evidence of acute diverticulitis. No bowel wall thickening or inflammatory change. Small hiatal hernia. Vascular/Lymphatic: Aortic atherosclerosis. No enlarged abdominal or pelvic lymph nodes. Reproductive: Status post hysterectomy. No adnexal masses. Other: No free fluid or free intraperitoneal gas. No abdominal wall hernia. Musculoskeletal: No acute or destructive bony abnormalities. Reconstructed images demonstrate no additional findings. IMPRESSION: 1. Distal colonic diverticulosis without evidence of acute diverticulitis. 2. Trace bilateral pleural effusions, new since chest CT performed earlier today. 3. Small hiatal hernia. 4.  Aortic Atherosclerosis (ICD10-I70.0). Electronically Signed   By: Sharlet Salina M.D.   On: 11/07/2023 17:49   ECHOCARDIOGRAM COMPLETE Result Date: 11/07/2023    ECHOCARDIOGRAM REPORT   Patient Name:   Amanda Cruz Date of Exam: 11/07/2023 Medical Rec #:  [id number]        Height:       63.0 in Accession #:    [id number]       Weight:       153.0 lb Date of Birth:  06/18/1946         BSA:          1.726 m Patient Age:    77 years         BP:  100/59 mmHg Patient Gender: F                HR:           100 bpm. Exam Location:  [facility] Procedure: 2D Echo, Cardiac Doppler and Color Doppler (Both Spectral and Color            Flow Doppler were utilized during procedure). Indications:     NSTEMI I21.4  History:         Patient has no prior history of Echocardiogram examinations.                  Risk Factors:Hypertension.  Sonographer:     Cristela Blue Referring Phys:  [id number] JAN A MANSY Diagnosing Phys: Lorine Bears MD IMPRESSIONS  1. Left ventricular ejection fraction,  by estimation, is 50 to 55%. The left ventricle has low normal function. The left ventricle has no regional wall motion abnormalities. There is moderate left ventricular hypertrophy. Left ventricular diastolic parameters are indeterminate.  2. Right ventricular systolic function is normal. The right ventricular size is normal. There is normal pulmonary artery systolic pressure.  3. The mitral valve is normal in structure. Moderate mitral valve regurgitation. No evidence of mitral stenosis.  4. The aortic valve is normal in structure. Aortic valve regurgitation is not visualized. No aortic stenosis is present.  5. The inferior vena cava is normal in size with greater than 50% respiratory variability, suggesting right atrial pressure of 3 mmHg. FINDINGS  Left Ventricle: Left ventricular ejection fraction, by estimation, is 50 to 55%. The left ventricle has low normal function. The left ventricle has no regional wall motion abnormalities. The left ventricular internal cavity size was normal in size. There is moderate left ventricular hypertrophy. Left ventricular diastolic parameters are indeterminate. Right Ventricle: The right ventricular size is normal. No increase in right ventricular wall thickness. Right ventricular systolic function is normal. There is normal pulmonary artery systolic pressure. The tricuspid regurgitant velocity is 2.34 m/s, and  with an assumed right atrial pressure of 3 mmHg, the estimated right ventricular systolic pressure is 24.9 mmHg. Left Atrium: Left atrial size was normal in size. Right Atrium: Right atrial size was normal in size. Pericardium: There is no evidence of pericardial effusion. Mitral Valve: The mitral valve is normal in structure. Moderate mitral valve regurgitation. No evidence of mitral valve stenosis. Tricuspid Valve: The tricuspid valve is normal in structure. Tricuspid valve regurgitation is trivial. No evidence of tricuspid stenosis. Aortic Valve: The aortic valve is  normal in structure. Aortic valve regurgitation is not visualized. No aortic stenosis is present. Aortic valve mean gradient measures 2.0 mmHg. Aortic valve peak gradient measures 3.2 mmHg. Aortic valve area, by VTI measures 2.89 cm. Pulmonic Valve: The pulmonic valve was normal in structure. Pulmonic valve regurgitation is mild. No evidence of pulmonic stenosis. Aorta: The aortic root is normal in size and structure. Venous: The inferior vena cava is normal in size with greater than 50% respiratory variability, suggesting right atrial pressure of 3 mmHg. IAS/Shunts: No atrial level shunt detected by color flow Doppler.  LEFT VENTRICLE PLAX 2D LVIDd:         3.30 cm LVIDs:         2.60 cm LV PW:         1.30 cm LV IVS:        1.40 cm LVOT diam:     2.10 cm LV SV:         44 LV SV  Index:   25 LVOT Area:     3.46 cm  LV Volumes (MOD) LV vol d, MOD A2C: 69.0 ml LV vol d, MOD A4C: 59.9 ml LV vol s, MOD A2C: 24.8 ml LV vol s, MOD A4C: 28.5 ml LV SV MOD A2C:     44.2 ml LV SV MOD A4C:     59.9 ml LV SV MOD BP:      38.8 ml RIGHT VENTRICLE RV Basal diam:  3.20 cm RV Mid diam:    2.70 cm RV S prime:     13.10 cm/s TAPSE (M-mode): 1.8 cm LEFT ATRIUM             Index        RIGHT ATRIUM           Index LA diam:        4.20 cm 2.43 cm/m   RA Area:     16.10 cm LA Vol (A2C):   43.6 ml 25.27 ml/m  RA Volume:   40.70 ml  23.59 ml/m LA Vol (A4C):   21.5 ml 12.46 ml/m LA Biplane Vol: 33.9 ml 19.65 ml/m  AORTIC VALVE AV Area (Vmax):    2.68 cm AV Area (Vmean):   2.43 cm AV Area (VTI):     2.89 cm AV Vmax:           89.90 cm/s AV Vmean:          59.700 cm/s AV VTI:            0.151 m AV Peak Grad:      3.2 mmHg AV Mean Grad:      2.0 mmHg LVOT Vmax:         69.50 cm/s LVOT Vmean:        41.800 cm/s LVOT VTI:          0.126 m LVOT/AV VTI ratio: 0.83  AORTA Ao Root diam: 3.50 cm MITRAL VALVE                TRICUSPID VALVE MV Area (PHT): 4.36 cm     TR Peak grad:   21.9 mmHg MV Decel Time: 174 msec     TR Vmax:         234.00 cm/s MV E velocity: 103.00 cm/s                             SHUNTS                             Systemic VTI:  0.13 m                             Systemic Diam: 2.10 cm Lorine Bears MD Electronically signed by Lorine Bears MD Signature Date/Time: 11/07/2023/4:14:04 PM    Final                LOS: 2 days   Lurene Shadow  [facility]   Pager on [email or web address]. If 7PM-7AM, please contact night-coverage at [email or web address]     11/09/2023, 12:36 PM

## 2023-11-09 NOTE — Progress Notes (Signed)
 ANTICOAGULATION CONSULT NOTE  Pharmacy Consult for heparin infusion Indication: ACS/STEMI  No Known Allergies  Patient Measurements: Height: 5\' 3"  (160 cm) Weight: 69.4 kg (153 lb) IBW/kg (Calculated) : 52.4 Heparin Dosing Weight: 66.7 kg  Vital Signs: Temp: 97.7 F (36.5 C) (03/12 1535) Temp Source: Oral (03/12 1254) BP: 107/57 (03/12 1535) Pulse Rate: 92 (03/12 1535)  Labs: Recent Labs    11/07/23 0001 11/07/23 0001 11/07/23 0002 11/07/23 0651 11/07/23 0934 11/07/23 1145 11/07/23 1342 11/07/23 1631 11/07/23 2309 11/08/23 0445 11/09/23 0514 11/09/23 1450  HGB 11.4*  --   --  SPECIMEN CONTAMINATED, UNABLE TO PERFORM TEST(S).  --  11.0*  --   --   --  10.0* 9.5*  --   HCT 30.9*  --   --  SPECIMEN CONTAMINATED, UNABLE TO PERFORM TEST(S).  --  29.9*  --   --   --  27.1* 26.5*  --   PLT 123*  --   --  SPECIMEN CONTAMINATED, UNABLE TO PERFORM TEST(S).  --  128*  --   --   --  127* 162  --   APTT 26  --   --   --  162*  --   --   --   --   --   --   --   LABPROT 13.9  --   --   --  15.6*  --   --   --   --   --   --   --   INR 1.0  --   --   --  1.2  --   --   --   --   --   --   --   HEPARINUNFRC  --   --   --   --  0.58  --   --   --    < > 0.38 0.20* 0.26*  CREATININE  --    < > 0.88  --  0.87  --  0.71  --   --  0.82 0.75  --   TROPONINIHS  --   --  5,416* 6,578*  --   --   --  3,144*  --   --   --   --    < > = values in this interval not displayed.   Estimated Creatinine Clearance: 54.2 mL/min (by C-G formula based on SCr of 0.75 mg/dL).  Medical History: Past Medical History:  Diagnosis Date   COVID-19 2021   GERD (gastroesophageal reflux disease)    Hypertension    Wears dentures    full upper and lower   Assessment: Pt is a 79 yo female presenting to ED w/ AMS, currently being treated for UTI, found with elevated Troponin I level and now evaluated for NSTEMI. Per chart review, patient is not on anticoagulation therapy at home. Pharmacy has been consulted  to manage this patient's heparin.  Baseline Labs INR/PT 1.2/15.6 Plt 123 Hgb 11.4  Monitor drop in hgb 8.7   Goal of Therapy:  Heparin level 0.3-0.7 units/ml Monitor platelets by anticoagulation protocol: Yes  Heparin Levels Date/Time  HL  Clinical Assessment 3/10@0934   0.58  Therapeutic x 1 3/10@2309                  0.38                Therapeutic X 2  3/11@0445                  0.38  Therapeutic X 3  3/12@0514                  0.20                SUBtherapeutic  3/12@1450   0.26  SUB-therapeutic   Plan:  Heparin orders were dc at 1234, infusion was never stopped. Will order heparin 1000 units bolus x 1, then increase infusion rate to 1050 units/hour. Repeat HL 8 hrs after infusion rete changed CBC with AM labs  Thank you for allowing pharmacy to participate in this patient's care.   Berlin Viereck Rodriguez-Guzman PharmD, BCPS 11/09/2023 3:45 PM

## 2023-11-09 NOTE — Plan of Care (Signed)
  Problem: Education: Goal: Understanding of cardiac disease, CV risk reduction, and recovery process will improve Outcome: Progressing Goal: Individualized Educational Video(s) Outcome: Progressing   Problem: Activity: Goal: Ability to tolerate increased activity will improve Outcome: Progressing   Problem: Cardiac: Goal: Ability to achieve and maintain adequate cardiovascular perfusion will improve Outcome: Progressing   Problem: Health Behavior/Discharge Planning: Goal: Ability to safely manage health-related needs after discharge will improve Outcome: Progressing   Problem: Fluid Volume: Goal: Hemodynamic stability will improve Outcome: Progressing   Problem: Clinical Measurements: Goal: Diagnostic test results will improve Outcome: Progressing Goal: Signs and symptoms of infection will decrease Outcome: Progressing   Problem: Respiratory: Goal: Ability to maintain adequate ventilation will improve Outcome: Progressing   Problem: Education: Goal: Knowledge of General Education information will improve Description: Including pain rating scale, medication(s)/side effects and non-pharmacologic comfort measures Outcome: Progressing   Problem: Health Behavior/Discharge Planning: Goal: Ability to manage health-related needs will improve Outcome: Progressing   Problem: Clinical Measurements: Goal: Ability to maintain clinical measurements within normal limits will improve Outcome: Progressing Goal: Will remain free from infection Outcome: Progressing Goal: Diagnostic test results will improve Outcome: Progressing Goal: Respiratory complications will improve Outcome: Progressing Goal: Cardiovascular complication will be avoided Outcome: Progressing   Problem: Activity: Goal: Risk for activity intolerance will decrease Outcome: Progressing   Problem: Nutrition: Goal: Adequate nutrition will be maintained Outcome: Progressing   Problem: Coping: Goal: Level of  anxiety will decrease Outcome: Progressing   Problem: Elimination: Goal: Will not experience complications related to bowel motility Outcome: Progressing Goal: Will not experience complications related to urinary retention Outcome: Progressing   Problem: Pain Managment: Goal: General experience of comfort will improve and/or be controlled Outcome: Progressing   Problem: Safety: Goal: Ability to remain free from injury will improve Outcome: Progressing   Problem: Skin Integrity: Goal: Risk for impaired skin integrity will decrease Outcome: Progressing   Problem: Education: Goal: Understanding of CV disease, CV risk reduction, and recovery process will improve Outcome: Progressing Goal: Individualized Educational Video(s) Outcome: Progressing   Problem: Activity: Goal: Ability to return to baseline activity level will improve Outcome: Progressing   Problem: Cardiovascular: Goal: Ability to achieve and maintain adequate cardiovascular perfusion will improve Outcome: Progressing Goal: Vascular access site(s) Level 0-1 will be maintained Outcome: Progressing   Problem: Health Behavior/Discharge Planning: Goal: Ability to safely manage health-related needs after discharge will improve Outcome: Progressing

## 2023-11-09 NOTE — H&P (View-Only) (Signed)
 Rounding Note    Patient Name: Amanda Cruz Date of Encounter: 11/09/2023  [facility] Cardiologist: new to [facility]  Subjective   Sitting up in a chair, reports having significant shortness of breath last night Felt panicky, could not breeze Family at the bedside Telemetry reviewed, runs of SVT 9:45 PM going for over 2 hours till around midnight, additional episodes 9:36 AM shoulder She has been receiving albuterol nebs prior to events  Inpatient Medications    Scheduled Meds:  Ferrous Fumarate  1 tablet Oral Daily   And   ascorbic acid  250 mg Oral Daily   aspirin EC  81 mg Oral Daily   atorvastatin  80 mg Oral Daily   budesonide (PULMICORT) nebulizer solution  0.25 mg Nebulization BID   Chlorhexidine Gluconate Cloth  6 each Topical Daily   cholecalciferol  1,000 Units Oral Daily   metoprolol tartrate  25 mg Oral NOW   metoprolol tartrate  50 mg Oral BID   pantoprazole (PROTONIX) IV  40 mg Intravenous Q12H   Continuous Infusions:  cefTRIAXone (ROCEPHIN)  IV 2 g (11/09/23 0008)   heparin 950 Units/hr (11/09/23 0922)   PRN Meds: acetaminophen, levalbuterol, magnesium hydroxide, nitroGLYCERIN, ondansetron (ZOFRAN) IV, mouth rinse, polyvinyl alcohol, traZODone   Vital Signs    Vitals:   11/09/23 0225 11/09/23 0229 11/09/23 0523 11/09/23 0837  BP: (!) 161/80 (!) 143/65 (!) 106/46 131/69  Pulse: (!) 120 (!) 119 87 97  Resp:   19   Temp:   98.6 F (37 C) 97.7 F (36.5 C)  TempSrc:   Oral   SpO2: 95% 93% 99% 98%  Weight:      Height:        Intake/Output Summary (Last 24 hours) at 11/09/2023 1228 Last data filed at 11/09/2023 9604 Gross per 24 hour  Intake 462 ml  Output 500 ml  Net -38 ml      11/06/2023   11:49 PM 09/06/2022    3:13 PM 08/05/2022    3:52 PM  Last 3 Weights  Weight (lbs) 153 lb 160 lb 160 lb 9.6 oz  Weight (kg) 69.4 kg 72.576 kg 72.848 kg      Telemetry    Normal sinus rhythm with SVT 9:45 PM until midnight, short run 9:36  AM- Personally Reviewed  ECG     - Personally Reviewed  Physical Exam   GEN: No acute distress.   Neck: No JVD Cardiac: RRR, no murmurs, rubs, or gallops.  Respiratory: Clear with scattered Rales GI: Soft, nontender, non-distended  MS: No edema; No deformity. Neuro:  Nonfocal  Psych: Normal affect   Labs    High Sensitivity Troponin:   Recent Labs  Lab 11/07/23 0002 11/07/23 0651 11/07/23 1631  TROPONINIHS 5,416* 6,095* 3,144*     Chemistry Recent Labs  Lab 11/07/23 0002 11/07/23 0934 11/07/23 1342 11/07/23 1806 11/08/23 0445 11/09/23 0514  NA 125* 130* 131*  --  132* 134*  K 2.6* 3.3* 4.1 4.0 3.2* 3.7  CL 92* 96* 100  --  100 103  CO2 22 22 19*  --  22 22  GLUCOSE 153* 196* 131*  --  132* 134*  BUN 12 11 10   --  11 13  CREATININE 0.88 0.87 0.71  --  0.82 0.75  CALCIUM 8.1* 7.5* 7.6*  --  7.6* 7.8*  MG  --   --   --  2.8* 2.6* 2.3  PROT 6.1* 5.8*  --   --   --   --  ALBUMIN 3.2* 2.7*  --   --   --   --   AST 43* 83*  --   --   --   --   ALT 29 42  --   --   --   --   ALKPHOS 62 89  --   --   --   --   BILITOT 0.7 0.5  --   --   --   --   GFRNONAA >60 >60 >60  --  >60 >60  ANIONGAP 11 12 12   --  10 9    Lipids No results for input(s): "CHOL", "TRIG", "HDL", "LABVLDL", "LDLCALC", "CHOLHDL" in the last 168 hours.  Hematology Recent Labs  Lab 11/07/23 1145 11/08/23 0445 11/09/23 0514  WBC 11.4* 7.1 9.1  RBC 3.53* 3.24* 3.05*  HGB 11.0* 10.0* 9.5*  HCT 29.9* 27.1* 26.5*  MCV 84.7 83.6 86.9  MCH 31.2 30.9 31.1  MCHC 36.8* 36.9* 35.8  RDW 11.8 11.9 12.2  PLT 128* 127* 162   Thyroid  Recent Labs  Lab 11/07/23 0934  TSH 1.843  FREET4 1.31*    BNP Recent Labs  Lab 11/07/23 0001  BNP 236.1*    DDimer No results for input(s): "DDIMER" in the last 168 hours.   Radiology    CT ABDOMEN PELVIS WO CONTRAST Result Date: 11/07/2023 CLINICAL DATA:  Sepsis EXAM: CT ABDOMEN AND PELVIS WITHOUT CONTRAST TECHNIQUE: Multidetector CT imaging of the  abdomen and pelvis was performed following the standard protocol without IV contrast. RADIATION DOSE REDUCTION: This exam was performed according to the departmental dose-optimization program which includes automated exposure control, adjustment of the mA and/or kV according to patient size and/or use of iterative reconstruction technique. COMPARISON:  11/07/2023 chest CT FINDINGS: Lower chest: Trace bilateral pleural effusions have developed in the interim. No acute airspace disease. Hepatobiliary: Unremarkable unenhanced appearance of the liver and gallbladder. Pancreas: Unremarkable unenhanced appearance. Spleen: Unremarkable unenhanced appearance. Adrenals/Urinary Tract: No urinary tract calculi or obstructive uropathy within either kidney. Right adrenal calcifications likely reflect prior infection or hemorrhage. Left adrenal is unremarkable. Bladder is moderately distended without filling defect. Stomach/Bowel: No bowel obstruction or ileus. Normal appendix right lower quadrant. Diverticulosis of the descending and sigmoid colon. No evidence of acute diverticulitis. No bowel wall thickening or inflammatory change. Small hiatal hernia. Vascular/Lymphatic: Aortic atherosclerosis. No enlarged abdominal or pelvic lymph nodes. Reproductive: Status post hysterectomy. No adnexal masses. Other: No free fluid or free intraperitoneal gas. No abdominal wall hernia. Musculoskeletal: No acute or destructive bony abnormalities. Reconstructed images demonstrate no additional findings. IMPRESSION: 1. Distal colonic diverticulosis without evidence of acute diverticulitis. 2. Trace bilateral pleural effusions, new since chest CT performed earlier today. 3. Small hiatal hernia. 4.  Aortic Atherosclerosis (ICD10-I70.0). Electronically Signed   By: Sharlet Salina M.D.   On: 11/07/2023 17:49   ECHOCARDIOGRAM COMPLETE Result Date: 11/07/2023    ECHOCARDIOGRAM REPORT   Patient Name:   Amanda Cruz Date of Exam: 11/07/2023  Medical Rec #:  [id number]        Height:       63.0 in Accession #:    [id number]       Weight:       153.0 lb Date of Birth:  25-Dec-1945         BSA:          1.726 m Patient Age:    77 years         BP:  100/59 mmHg Patient Gender: F                HR:           100 bpm. Exam Location:  [facility] Procedure: 2D Echo, Cardiac Doppler and Color Doppler (Both Spectral and Color            Flow Doppler were utilized during procedure). Indications:     NSTEMI I21.4  History:         Patient has no prior history of Echocardiogram examinations.                  Risk Factors:Hypertension.  Sonographer:     Cristela Blue Referring Phys:  [id number] JAN A MANSY Diagnosing Phys: Lorine Bears MD IMPRESSIONS  1. Left ventricular ejection fraction, by estimation, is 50 to 55%. The left ventricle has low normal function. The left ventricle has no regional wall motion abnormalities. There is moderate left ventricular hypertrophy. Left ventricular diastolic parameters are indeterminate.  2. Right ventricular systolic function is normal. The right ventricular size is normal. There is normal pulmonary artery systolic pressure.  3. The mitral valve is normal in structure. Moderate mitral valve regurgitation. No evidence of mitral stenosis.  4. The aortic valve is normal in structure. Aortic valve regurgitation is not visualized. No aortic stenosis is present.  5. The inferior vena cava is normal in size with greater than 50% respiratory variability, suggesting right atrial pressure of 3 mmHg. FINDINGS  Left Ventricle: Left ventricular ejection fraction, by estimation, is 50 to 55%. The left ventricle has low normal function. The left ventricle has no regional wall motion abnormalities. The left ventricular internal cavity size was normal in size. There is moderate left ventricular hypertrophy. Left ventricular diastolic parameters are indeterminate. Right Ventricle: The right ventricular size is normal. No increase in right ventricular  wall thickness. Right ventricular systolic function is normal. There is normal pulmonary artery systolic pressure. The tricuspid regurgitant velocity is 2.34 m/s, and  with an assumed right atrial pressure of 3 mmHg, the estimated right ventricular systolic pressure is 24.9 mmHg. Left Atrium: Left atrial size was normal in size. Right Atrium: Right atrial size was normal in size. Pericardium: There is no evidence of pericardial effusion. Mitral Valve: The mitral valve is normal in structure. Moderate mitral valve regurgitation. No evidence of mitral valve stenosis. Tricuspid Valve: The tricuspid valve is normal in structure. Tricuspid valve regurgitation is trivial. No evidence of tricuspid stenosis. Aortic Valve: The aortic valve is normal in structure. Aortic valve regurgitation is not visualized. No aortic stenosis is present. Aortic valve mean gradient measures 2.0 mmHg. Aortic valve peak gradient measures 3.2 mmHg. Aortic valve area, by VTI measures 2.89 cm. Pulmonic Valve: The pulmonic valve was normal in structure. Pulmonic valve regurgitation is mild. No evidence of pulmonic stenosis. Aorta: The aortic root is normal in size and structure. Venous: The inferior vena cava is normal in size with greater than 50% respiratory variability, suggesting right atrial pressure of 3 mmHg. IAS/Shunts: No atrial level shunt detected by color flow Doppler.  LEFT VENTRICLE PLAX 2D LVIDd:         3.30 cm LVIDs:         2.60 cm LV PW:         1.30 cm LV IVS:        1.40 cm LVOT diam:     2.10 cm LV SV:         44 LV SV Index:  25 LVOT Area:     3.46 cm  LV Volumes (MOD) LV vol d, MOD A2C: 69.0 ml LV vol d, MOD A4C: 59.9 ml LV vol s, MOD A2C: 24.8 ml LV vol s, MOD A4C: 28.5 ml LV SV MOD A2C:     44.2 ml LV SV MOD A4C:     59.9 ml LV SV MOD BP:      38.8 ml RIGHT VENTRICLE RV Basal diam:  3.20 cm RV Mid diam:    2.70 cm RV S prime:     13.10 cm/s TAPSE (M-mode): 1.8 cm LEFT ATRIUM             Index        RIGHT ATRIUM            Index LA diam:        4.20 cm 2.43 cm/m   RA Area:     16.10 cm LA Vol (A2C):   43.6 ml 25.27 ml/m  RA Volume:   40.70 ml  23.59 ml/m LA Vol (A4C):   21.5 ml 12.46 ml/m LA Biplane Vol: 33.9 ml 19.65 ml/m  AORTIC VALVE AV Area (Vmax):    2.68 cm AV Area (Vmean):   2.43 cm AV Area (VTI):     2.89 cm AV Vmax:           89.90 cm/s AV Vmean:          59.700 cm/s AV VTI:            0.151 m AV Peak Grad:      3.2 mmHg AV Mean Grad:      2.0 mmHg LVOT Vmax:         69.50 cm/s LVOT Vmean:        41.800 cm/s LVOT VTI:          0.126 m LVOT/AV VTI ratio: 0.83  AORTA Ao Root diam: 3.50 cm MITRAL VALVE                TRICUSPID VALVE MV Area (PHT): 4.36 cm     TR Peak grad:   21.9 mmHg MV Decel Time: 174 msec     TR Vmax:        234.00 cm/s MV E velocity: 103.00 cm/s                             SHUNTS                             Systemic VTI:  0.13 m                             Systemic Diam: 2.10 cm Lorine Bears MD Electronically signed by Lorine Bears MD Signature Date/Time: 11/07/2023/4:14:04 PM    Final     Cardiac Studies   Echo   1. Left ventricular ejection fraction, by estimation, is 50 to 55%. The  left ventricle has low normal function. The left ventricle has no regional  wall motion abnormalities. There is moderate left ventricular hypertrophy.  Left ventricular diastolic  parameters are indeterminate.   2. Right ventricular systolic function is normal. The right ventricular  size is normal. There is normal pulmonary artery systolic pressure.   3. The mitral valve is normal in structure. Moderate mitral valve  regurgitation. No evidence of mitral stenosis.  4. The aortic valve is normal in structure. Aortic valve regurgitation is  not visualized. No aortic stenosis is present.   5. The inferior vena cava is normal in size with greater than 50%  respiratory variability, suggesting right atrial pressure of 3 mmHg.   Patient Profile     Amanda Cruz is a 78 y.o. female with a hx  of hypertension, chronic venous insufficiency, sciatica, GERD, hyperlipidemia who is being seen 11/07/2023 for the evaluation of non-STEMI   Assessment & Plan    Non-STEMI In the setting of septic shock E. coli septicemia, urinary source troponin up to 6000 Low normal ejection fraction On aspirin, statin, beta-blocker, heparin infusion Cardiac catheterization on hold today given scheduling issues, plan for catheterization tomorrow Risk and benefit discussed with family and patient again She is willing to proceed tomorrow N.p.o. after midnight given case is at 11 AM  Paroxysmal tachycardia/SVT SVT noted on telemetry last night 945 paroxysmal going until midnight again 9:45 AM Seem to present after albuterol nebulizer treatments Will recommend we change her albuterol to Xopenex, Increase metoprolol to tartrate up to 50 twice daily For recurrent episodes on telemetry May need to start amiodarone Potassium and magnesium repleted  Septic shock/sepsis, Urinary source Initially requiring Levophed IV, this has been weaned off Chest CTA negative Blood pressure stabilized, on antibiotics    For questions or updates, please contact [facility] Please consult [email or web address] for contact info under        Signed, Julien Nordmann, MD  11/09/2023, 12:28 PM

## 2023-11-09 NOTE — Progress Notes (Signed)
 Rounding Note    Patient Name: Amanda Cruz Date of Encounter: 11/09/2023  [facility] Cardiologist: new to [facility]  Subjective   Sitting up in a chair, reports having significant shortness of breath last night Felt panicky, could not breeze Family at the bedside Telemetry reviewed, runs of SVT 9:45 PM going for over 2 hours till around midnight, additional episodes 9:36 AM shoulder She has been receiving albuterol nebs prior to events  Inpatient Medications    Scheduled Meds:  Ferrous Fumarate  1 tablet Oral Daily   And   ascorbic acid  250 mg Oral Daily   aspirin EC  81 mg Oral Daily   atorvastatin  80 mg Oral Daily   budesonide (PULMICORT) nebulizer solution  0.25 mg Nebulization BID   Chlorhexidine Gluconate Cloth  6 each Topical Daily   cholecalciferol  1,000 Units Oral Daily   metoprolol tartrate  25 mg Oral NOW   metoprolol tartrate  50 mg Oral BID   pantoprazole (PROTONIX) IV  40 mg Intravenous Q12H   Continuous Infusions:  cefTRIAXone (ROCEPHIN)  IV 2 g (11/09/23 0008)   heparin 950 Units/hr (11/09/23 0922)   PRN Meds: acetaminophen, levalbuterol, magnesium hydroxide, nitroGLYCERIN, ondansetron (ZOFRAN) IV, mouth rinse, polyvinyl alcohol, traZODone   Vital Signs    Vitals:   11/09/23 0225 11/09/23 0229 11/09/23 0523 11/09/23 0837  BP: (!) 161/80 (!) 143/65 (!) 106/46 131/69  Pulse: (!) 120 (!) 119 87 97  Resp:   19   Temp:   98.6 F (37 C) 97.7 F (36.5 C)  TempSrc:   Oral   SpO2: 95% 93% 99% 98%  Weight:      Height:        Intake/Output Summary (Last 24 hours) at 11/09/2023 1228 Last data filed at 11/09/2023 9604 Gross per 24 hour  Intake 462 ml  Output 500 ml  Net -38 ml      11/06/2023   11:49 PM 09/06/2022    3:13 PM 08/05/2022    3:52 PM  Last 3 Weights  Weight (lbs) 153 lb 160 lb 160 lb 9.6 oz  Weight (kg) 69.4 kg 72.576 kg 72.848 kg      Telemetry    Normal sinus rhythm with SVT 9:45 PM until midnight, short run 9:36  AM- Personally Reviewed  ECG     - Personally Reviewed  Physical Exam   GEN: No acute distress.   Neck: No JVD Cardiac: RRR, no murmurs, rubs, or gallops.  Respiratory: Clear with scattered Rales GI: Soft, nontender, non-distended  MS: No edema; No deformity. Neuro:  Nonfocal  Psych: Normal affect   Labs    High Sensitivity Troponin:   Recent Labs  Lab 11/07/23 0002 11/07/23 0651 11/07/23 1631  TROPONINIHS 5,416* 6,095* 3,144*     Chemistry Recent Labs  Lab 11/07/23 0002 11/07/23 0934 11/07/23 1342 11/07/23 1806 11/08/23 0445 11/09/23 0514  NA 125* 130* 131*  --  132* 134*  K 2.6* 3.3* 4.1 4.0 3.2* 3.7  CL 92* 96* 100  --  100 103  CO2 22 22 19*  --  22 22  GLUCOSE 153* 196* 131*  --  132* 134*  BUN 12 11 10   --  11 13  CREATININE 0.88 0.87 0.71  --  0.82 0.75  CALCIUM 8.1* 7.5* 7.6*  --  7.6* 7.8*  MG  --   --   --  2.8* 2.6* 2.3  PROT 6.1* 5.8*  --   --   --   --  ALBUMIN 3.2* 2.7*  --   --   --   --   AST 43* 83*  --   --   --   --   ALT 29 42  --   --   --   --   ALKPHOS 62 89  --   --   --   --   BILITOT 0.7 0.5  --   --   --   --   GFRNONAA >60 >60 >60  --  >60 >60  ANIONGAP 11 12 12   --  10 9    Lipids No results for input(s): "CHOL", "TRIG", "HDL", "LABVLDL", "LDLCALC", "CHOLHDL" in the last 168 hours.  Hematology Recent Labs  Lab 11/07/23 1145 11/08/23 0445 11/09/23 0514  WBC 11.4* 7.1 9.1  RBC 3.53* 3.24* 3.05*  HGB 11.0* 10.0* 9.5*  HCT 29.9* 27.1* 26.5*  MCV 84.7 83.6 86.9  MCH 31.2 30.9 31.1  MCHC 36.8* 36.9* 35.8  RDW 11.8 11.9 12.2  PLT 128* 127* 162   Thyroid  Recent Labs  Lab 11/07/23 0934  TSH 1.843  FREET4 1.31*    BNP Recent Labs  Lab 11/07/23 0001  BNP 236.1*    DDimer No results for input(s): "DDIMER" in the last 168 hours.   Radiology    CT ABDOMEN PELVIS WO CONTRAST Result Date: 11/07/2023 CLINICAL DATA:  Sepsis EXAM: CT ABDOMEN AND PELVIS WITHOUT CONTRAST TECHNIQUE: Multidetector CT imaging of the  abdomen and pelvis was performed following the standard protocol without IV contrast. RADIATION DOSE REDUCTION: This exam was performed according to the departmental dose-optimization program which includes automated exposure control, adjustment of the mA and/or kV according to patient size and/or use of iterative reconstruction technique. COMPARISON:  11/07/2023 chest CT FINDINGS: Lower chest: Trace bilateral pleural effusions have developed in the interim. No acute airspace disease. Hepatobiliary: Unremarkable unenhanced appearance of the liver and gallbladder. Pancreas: Unremarkable unenhanced appearance. Spleen: Unremarkable unenhanced appearance. Adrenals/Urinary Tract: No urinary tract calculi or obstructive uropathy within either kidney. Right adrenal calcifications likely reflect prior infection or hemorrhage. Left adrenal is unremarkable. Bladder is moderately distended without filling defect. Stomach/Bowel: No bowel obstruction or ileus. Normal appendix right lower quadrant. Diverticulosis of the descending and sigmoid colon. No evidence of acute diverticulitis. No bowel wall thickening or inflammatory change. Small hiatal hernia. Vascular/Lymphatic: Aortic atherosclerosis. No enlarged abdominal or pelvic lymph nodes. Reproductive: Status post hysterectomy. No adnexal masses. Other: No free fluid or free intraperitoneal gas. No abdominal wall hernia. Musculoskeletal: No acute or destructive bony abnormalities. Reconstructed images demonstrate no additional findings. IMPRESSION: 1. Distal colonic diverticulosis without evidence of acute diverticulitis. 2. Trace bilateral pleural effusions, new since chest CT performed earlier today. 3. Small hiatal hernia. 4.  Aortic Atherosclerosis (ICD10-I70.0). Electronically Signed   By: Sharlet Salina M.D.   On: 11/07/2023 17:49   ECHOCARDIOGRAM COMPLETE Result Date: 11/07/2023    ECHOCARDIOGRAM REPORT   Patient Name:   Amanda Cruz Date of Exam: 11/07/2023  Medical Rec #:  [id number]        Height:       63.0 in Accession #:    [id number]       Weight:       153.0 lb Date of Birth:  25-Dec-1945         BSA:          1.726 m Patient Age:    77 years         BP:  100/59 mmHg Patient Gender: F                HR:           100 bpm. Exam Location:  [facility] Procedure: 2D Echo, Cardiac Doppler and Color Doppler (Both Spectral and Color            Flow Doppler were utilized during procedure). Indications:     NSTEMI I21.4  History:         Patient has no prior history of Echocardiogram examinations.                  Risk Factors:Hypertension.  Sonographer:     Cristela Blue Referring Phys:  [id number] JAN A MANSY Diagnosing Phys: Lorine Bears MD IMPRESSIONS  1. Left ventricular ejection fraction, by estimation, is 50 to 55%. The left ventricle has low normal function. The left ventricle has no regional wall motion abnormalities. There is moderate left ventricular hypertrophy. Left ventricular diastolic parameters are indeterminate.  2. Right ventricular systolic function is normal. The right ventricular size is normal. There is normal pulmonary artery systolic pressure.  3. The mitral valve is normal in structure. Moderate mitral valve regurgitation. No evidence of mitral stenosis.  4. The aortic valve is normal in structure. Aortic valve regurgitation is not visualized. No aortic stenosis is present.  5. The inferior vena cava is normal in size with greater than 50% respiratory variability, suggesting right atrial pressure of 3 mmHg. FINDINGS  Left Ventricle: Left ventricular ejection fraction, by estimation, is 50 to 55%. The left ventricle has low normal function. The left ventricle has no regional wall motion abnormalities. The left ventricular internal cavity size was normal in size. There is moderate left ventricular hypertrophy. Left ventricular diastolic parameters are indeterminate. Right Ventricle: The right ventricular size is normal. No increase in right ventricular  wall thickness. Right ventricular systolic function is normal. There is normal pulmonary artery systolic pressure. The tricuspid regurgitant velocity is 2.34 m/s, and  with an assumed right atrial pressure of 3 mmHg, the estimated right ventricular systolic pressure is 24.9 mmHg. Left Atrium: Left atrial size was normal in size. Right Atrium: Right atrial size was normal in size. Pericardium: There is no evidence of pericardial effusion. Mitral Valve: The mitral valve is normal in structure. Moderate mitral valve regurgitation. No evidence of mitral valve stenosis. Tricuspid Valve: The tricuspid valve is normal in structure. Tricuspid valve regurgitation is trivial. No evidence of tricuspid stenosis. Aortic Valve: The aortic valve is normal in structure. Aortic valve regurgitation is not visualized. No aortic stenosis is present. Aortic valve mean gradient measures 2.0 mmHg. Aortic valve peak gradient measures 3.2 mmHg. Aortic valve area, by VTI measures 2.89 cm. Pulmonic Valve: The pulmonic valve was normal in structure. Pulmonic valve regurgitation is mild. No evidence of pulmonic stenosis. Aorta: The aortic root is normal in size and structure. Venous: The inferior vena cava is normal in size with greater than 50% respiratory variability, suggesting right atrial pressure of 3 mmHg. IAS/Shunts: No atrial level shunt detected by color flow Doppler.  LEFT VENTRICLE PLAX 2D LVIDd:         3.30 cm LVIDs:         2.60 cm LV PW:         1.30 cm LV IVS:        1.40 cm LVOT diam:     2.10 cm LV SV:         44 LV SV Index:  25 LVOT Area:     3.46 cm  LV Volumes (MOD) LV vol d, MOD A2C: 69.0 ml LV vol d, MOD A4C: 59.9 ml LV vol s, MOD A2C: 24.8 ml LV vol s, MOD A4C: 28.5 ml LV SV MOD A2C:     44.2 ml LV SV MOD A4C:     59.9 ml LV SV MOD BP:      38.8 ml RIGHT VENTRICLE RV Basal diam:  3.20 cm RV Mid diam:    2.70 cm RV S prime:     13.10 cm/s TAPSE (M-mode): 1.8 cm LEFT ATRIUM             Index        RIGHT ATRIUM            Index LA diam:        4.20 cm 2.43 cm/m   RA Area:     16.10 cm LA Vol (A2C):   43.6 ml 25.27 ml/m  RA Volume:   40.70 ml  23.59 ml/m LA Vol (A4C):   21.5 ml 12.46 ml/m LA Biplane Vol: 33.9 ml 19.65 ml/m  AORTIC VALVE AV Area (Vmax):    2.68 cm AV Area (Vmean):   2.43 cm AV Area (VTI):     2.89 cm AV Vmax:           89.90 cm/s AV Vmean:          59.700 cm/s AV VTI:            0.151 m AV Peak Grad:      3.2 mmHg AV Mean Grad:      2.0 mmHg LVOT Vmax:         69.50 cm/s LVOT Vmean:        41.800 cm/s LVOT VTI:          0.126 m LVOT/AV VTI ratio: 0.83  AORTA Ao Root diam: 3.50 cm MITRAL VALVE                TRICUSPID VALVE MV Area (PHT): 4.36 cm     TR Peak grad:   21.9 mmHg MV Decel Time: 174 msec     TR Vmax:        234.00 cm/s MV E velocity: 103.00 cm/s                             SHUNTS                             Systemic VTI:  0.13 m                             Systemic Diam: 2.10 cm Lorine Bears MD Electronically signed by Lorine Bears MD Signature Date/Time: 11/07/2023/4:14:04 PM    Final     Cardiac Studies   Echo   1. Left ventricular ejection fraction, by estimation, is 50 to 55%. The  left ventricle has low normal function. The left ventricle has no regional  wall motion abnormalities. There is moderate left ventricular hypertrophy.  Left ventricular diastolic  parameters are indeterminate.   2. Right ventricular systolic function is normal. The right ventricular  size is normal. There is normal pulmonary artery systolic pressure.   3. The mitral valve is normal in structure. Moderate mitral valve  regurgitation. No evidence of mitral stenosis.  4. The aortic valve is normal in structure. Aortic valve regurgitation is  not visualized. No aortic stenosis is present.   5. The inferior vena cava is normal in size with greater than 50%  respiratory variability, suggesting right atrial pressure of 3 mmHg.   Patient Profile     Amanda Cruz is a 78 y.o. female with a hx  of hypertension, chronic venous insufficiency, sciatica, GERD, hyperlipidemia who is being seen 11/07/2023 for the evaluation of non-STEMI   Assessment & Plan    Non-STEMI In the setting of septic shock E. coli septicemia, urinary source troponin up to 6000 Low normal ejection fraction On aspirin, statin, beta-blocker, heparin infusion Cardiac catheterization on hold today given scheduling issues, plan for catheterization tomorrow Risk and benefit discussed with family and patient again She is willing to proceed tomorrow N.p.o. after midnight given case is at 11 AM  Paroxysmal tachycardia/SVT SVT noted on telemetry last night 945 paroxysmal going until midnight again 9:45 AM Seem to present after albuterol nebulizer treatments Will recommend we change her albuterol to Xopenex, Increase metoprolol to tartrate up to 50 twice daily For recurrent episodes on telemetry May need to start amiodarone Potassium and magnesium repleted  Septic shock/sepsis, Urinary source Initially requiring Levophed IV, this has been weaned off Chest CTA negative Blood pressure stabilized, on antibiotics    For questions or updates, please contact [facility] Please consult [email or web address] for contact info under        Signed, Julien Nordmann, MD  11/09/2023, 12:28 PM

## 2023-11-09 NOTE — Consult Note (Addendum)
 PHARMACY CONSULT NOTE - FOLLOW UP  Pharmacy Consult for Electrolyte Monitoring and Replacement   Recent Labs: Potassium (mmol/L)  Date Value  11/09/2023 3.7   Magnesium (mg/dL)  Date Value  16/05/9603 2.3   Calcium (mg/dL)  Date Value  54/04/8118 7.8 (L)   Albumin (g/dL)  Date Value  14/78/2956 2.7 (L)   Phosphorus (mg/dL)  Date Value  21/30/8657 2.4 (L)   Sodium (mmol/L)  Date Value  11/09/2023 134 (L)   Assessment: Amanda Cruz is a 78 yo female who presented to the ED 3/9 with altered mental status and shortness of breath with fever. They were diagnosed with a UTI 2 days ago and treated with Macrobid. Patient was admitted to the ICU due to requiring levophed. Pharmacy was consulted to manage this patient's electrolytes while they're in the ICU.   Goal of Therapy:  Potassium 4.0 - 5.1 mmol/L Magnesium 20.4 - 2.4 mg/dL All Other Electrolytes WNL  Plan:  K = 3.7: Replace with 40 mEq Kcl PO x 1 No other replacement indicated at this time Continue to follow electrolytes with AM labs  Thank you for allowing pharmacy to participate in this patient's care.   Effie Shy, PharmD Pharmacy Resident  11/09/2023 6:23 AM

## 2023-11-09 NOTE — Evaluation (Addendum)
 Occupational Therapy Evaluation Patient Details Name: Amanda Cruz MRN: [id number] DOB: 1946/06/27 Today's Date: 11/09/2023   History of Present Illness   Pt is a 78 yo female that presented to ED for SOB, fever, AMS. Recent diagnosis with UTI, workup for elevated troponin, non-STEMI, septic shock. PMH of HTN, venous insufficiency, HLD, GERD.     Clinical Impressions Chart reviewed to date, pt greeted in chair with sons present, agreeable to OT evaluation. Pt is alert and oriented x4. PTA pt is MOD I-I in ADL/IADL, amb with no AD. Pt presents with deficits in activity tolerance and balance, affecting safe and optimal ADL completion. STS completed with RW with CGA, amb in room with RW with CGA, frequent vcs for technique, SET UP for grooming tasks. Anticipate MIN A for LB dressing. Spo2 >90% on 2L via Esmeralda, HR 94 bpm after mobility. Pt will benefit from acute OT to address deficits and to facilitate optimal ADL performance. OT will continue to follow acutely.     If plan is discharge home, recommend the following:   A little help with walking and/or transfers;A little help with bathing/dressing/bathroom;Help with stairs or ramp for entrance     Functional Status Assessment   Patient has had a recent decline in their functional status and/or demonstrates limited ability to make significant improvements in function in a reasonable and predictable amount of time     Equipment Recommendations   Other (comment) (2WW)     Recommendations for Other Services         Precautions/Restrictions   Precautions Precautions: Fall Recall of Precautions/Restrictions: Intact Restrictions Weight Bearing Restrictions Per Provider Order: No     Mobility Bed Mobility               General bed mobility comments: NT in recliner pre/post session    Transfers Overall transfer level: Needs assistance   Transfers: Sit to/from Stand Sit to Stand: Supervision                   Balance Overall balance assessment: Needs assistance Sitting-balance support: Feet supported Sitting balance-Leahy Scale: Good     Standing balance support: Bilateral upper extremity supported Standing balance-Leahy Scale: Good                             ADL either performed or assessed with clinical judgement   ADL Overall ADL's : Needs assistance/impaired     Grooming: Set up               Lower Body Dressing: Minimal assistance   Toilet Transfer: Contact guard assist;Rolling walker (2 wheels) Toilet Transfer Details (indicate cue type and reason): intermittent vcs for technique         Functional mobility during ADLs: Contact guard assist;Supervision/safety;Rolling walker (2 wheels) (approx 10' in room with RW; frequent vcs for technique)       Vision Baseline Vision/History: 1 Wears glasses Patient Visual Report: No change from baseline       Perception         Praxis         Pertinent Vitals/Pain Pain Assessment Pain Assessment: No/denies pain     Extremity/Trunk Assessment Upper Extremity Assessment Upper Extremity Assessment: Overall WFL for tasks assessed   Lower Extremity Assessment Lower Extremity Assessment: Overall WFL for tasks assessed       Communication Communication Communication: No apparent difficulties   Cognition Arousal: Alert Behavior During Therapy: [facility]  for tasks assessed/performed Cognition: No apparent impairments                               Following commands: Intact       Cueing  General Comments   Cueing Techniques: Verbal cues  vss throughout   Exercises Other Exercises Other Exercises: edu pt/sons re: role of OT, role of rehab, DME for safe ADL completion   Shoulder Instructions      Home Living Family/patient expects to be discharged to:: Private residence Living Arrangements: Spouse/significant other (pt care for spouse) Available Help at Discharge: Family Type of  Home: House Home Access: Stairs to enter Secretary/administrator of Steps: 6 Entrance Stairs-Rails: Right;Left;Can reach both Home Layout: One level     Bathroom Shower/Tub: Producer, television/film/video: Standard     Home Equipment: Agricultural consultant (2 wheels);BSC/3in1;Shower seat          Prior Functioning/Environment Prior Level of Function : Independent/Modified Independent             Mobility Comments: amb with no AD; pt is primary caregiver to husband who is bed bound per her report ADLs Comments: MOD I-I in ADL/IADL    OT Problem List: Decreased strength;Decreased activity tolerance   OT Treatment/Interventions: Self-care/ADL training;Balance training;Therapeutic exercise;Therapeutic activities;DME and/or AE instruction;Patient/family education      OT Goals(Current goals can be found in the care plan section)   Acute Rehab OT Goals Patient Stated Goal: go home OT Goal Formulation: With patient Time For Goal Achievement: 11/23/23 Potential to Achieve Goals: Good ADL Goals Pt Will Perform Grooming: with modified independence;sitting Pt Will Perform Lower Body Dressing: with modified independence;sitting/lateral leans;sit to/from stand Pt Will Transfer to Toilet: with modified independence;ambulating Pt Will Perform Toileting - Clothing Manipulation and hygiene: with modified independence;sitting/lateral leans;sit to/from stand   OT Frequency:  Min 2X/week    Co-evaluation              AM-PAC OT "6 Clicks" Daily Activity     Outcome Measure Help from another person eating meals?: None Help from another person taking care of personal grooming?: None Help from another person toileting, which includes using toliet, bedpan, or urinal?: None Help from another person bathing (including washing, rinsing, drying)?: A Little Help from another person to put on and taking off regular upper body clothing?: None Help from another person to put on and taking  off regular lower body clothing?: A Little 6 Click Score: 22   End of Session Equipment Utilized During Treatment: Rolling walker (2 wheels);Oxygen Nurse Communication: Mobility status  Activity Tolerance: Patient tolerated treatment well Patient left: in chair;with call bell/phone within reach;with chair alarm set  OT Visit Diagnosis: Other abnormalities of gait and mobility (R26.89);Muscle weakness (generalized) (M62.81)                Time: 6045-4098 OT Time Calculation (min): 23 min Charges:  OT General Charges $OT Visit: 1 Visit OT Evaluation $OT Eval Low Complexity: 1 Low Oleta Mouse, OTD OTR/L  11/09/23, 12:42 PM

## 2023-11-10 ENCOUNTER — Encounter
Admission: EM | Disposition: A | Discharge status: Home Health | Payer: Self-pay | Source: Home / Self Care | Attending: Internal Medicine

## 2023-11-10 ENCOUNTER — Encounter: Payer: Self-pay | Admitting: Internal Medicine

## 2023-11-10 DIAGNOSIS — I214 Non-ST elevation (NSTEMI) myocardial infarction: Secondary | ICD-10-CM | POA: Diagnosis not present

## 2023-11-10 DIAGNOSIS — I471 Supraventricular tachycardia, unspecified: Secondary | ICD-10-CM | POA: Diagnosis not present

## 2023-11-10 DIAGNOSIS — E876 Hypokalemia: Secondary | ICD-10-CM | POA: Diagnosis not present

## 2023-11-10 DIAGNOSIS — R6521 Severe sepsis with septic shock: Secondary | ICD-10-CM | POA: Diagnosis not present

## 2023-11-10 DIAGNOSIS — A419 Sepsis, unspecified organism: Secondary | ICD-10-CM | POA: Diagnosis not present

## 2023-11-10 HISTORY — PX: LEFT HEART CATH AND CORONARY ANGIOGRAPHY: CATH118249

## 2023-11-10 LAB — HEPARIN LEVEL (UNFRACTIONATED)
Heparin Unfractionated: 0.53 [IU]/mL (ref 0.30–0.70)
Heparin Unfractionated: 0.39 [IU]/mL (ref 0.30–0.70)

## 2023-11-10 LAB — BASIC METABOLIC PANEL
Anion gap: 7 (ref 5–15)
BUN: 13 mg/dL (ref 8–23)
CO2: 23 mmol/L (ref 22–32)
Calcium: 8 mg/dL — ABNORMAL LOW (ref 8.9–10.3)
Chloride: 107 mmol/L (ref 98–111)
Creatinine, Ser: 0.68 mg/dL (ref 0.44–1.00)
GFR, Estimated: >60 mL/min (ref >60)
Glucose, Bld: 101 mg/dL — ABNORMAL HIGH (ref 70–99)
Potassium: 4.2 mmol/L (ref 3.5–5.1)
Sodium: 137 mmol/L (ref 135–145)

## 2023-11-10 LAB — CBC
HCT: 32 % — ABNORMAL LOW (ref 36.0–46.0)
Hemoglobin: 11.3 g/dL — ABNORMAL LOW (ref 12.0–15.0)
MCH: 30.6 pg (ref 26.0–34.0)
MCHC: 35.3 g/dL (ref 30.0–36.0)
MCV: 86.7 fL (ref 80.0–100.0)
Platelets: 225 10*3/uL (ref 150–400)
RBC: 3.69 MIL/uL — ABNORMAL LOW (ref 3.87–5.11)
RDW: 12.5 % (ref 11.5–15.5)
WBC: 6.5 10*3/uL (ref 4.0–10.5)
nRBC: 0 % (ref 0.0–0.2)

## 2023-11-10 LAB — LIPID PANEL
Cholesterol: 89 mg/dL (ref 0–200)
HDL: 27 mg/dL — ABNORMAL LOW (ref >40)
LDL Cholesterol: 45 mg/dL (ref 0–99)
Total CHOL/HDL Ratio: 3.3 ratio
Triglycerides: 85 mg/dL (ref <150)
VLDL: 17 mg/dL (ref 0–40)

## 2023-11-10 LAB — PHOSPHORUS: Phosphorus: 2.3 mg/dL — ABNORMAL LOW (ref 2.5–4.6)

## 2023-11-10 LAB — MAGNESIUM: Magnesium: 2.1 mg/dL (ref 1.7–2.4)

## 2023-11-10 SURGERY — LEFT HEART CATH AND CORONARY ANGIOGRAPHY
Anesthesia: Moderate Sedation

## 2023-11-10 MED ORDER — AMOXICILLIN 500 MG PO CAPS
1000.0000 mg | ORAL_CAPSULE | Freq: Three times a day (TID) | ORAL | Status: DC
  Administered 2023-11-10 – 2023-11-12 (×5): 1000 mg via ORAL
  Filled 2023-11-10 (×7): qty 2

## 2023-11-10 MED ORDER — SODIUM CHLORIDE 0.9 % IV SOLN
INTRAVENOUS | Status: DC
Start: 2023-11-10 — End: 2023-11-10

## 2023-11-10 MED ORDER — SODIUM CHLORIDE 0.9% FLUSH: 3.0000 mL | INTRAVENOUS | Status: DC | PRN

## 2023-11-10 MED ORDER — VERAPAMIL HCL 2.5 MG/ML IV SOLN
INTRAVENOUS | Status: AC
  Filled 2023-11-10: qty 2

## 2023-11-10 MED ORDER — MIDAZOLAM HCL 2 MG/2ML IJ SOLN
INTRAMUSCULAR | Status: AC
  Filled 2023-11-10: qty 2

## 2023-11-10 MED ORDER — VERAPAMIL HCL 2.5 MG/ML IV SOLN
INTRAVENOUS | Status: DC | PRN
  Administered 2023-11-10 (×2): 2.5 mg via INTRAVENOUS

## 2023-11-10 MED ORDER — FUROSEMIDE 10 MG/ML IJ SOLN
40.0000 mg | Freq: Once | INTRAMUSCULAR | Status: AC
  Administered 2023-11-10: 40 mg via INTRAVENOUS

## 2023-11-10 MED ORDER — ENOXAPARIN SODIUM 40 MG/0.4ML IJ SOSY
40.0000 mg | PREFILLED_SYRINGE | INTRAMUSCULAR | Status: DC
  Administered 2023-11-11 – 2023-11-12 (×2): 40 mg via SUBCUTANEOUS
  Filled 2023-11-10 (×2): qty 0.4

## 2023-11-10 MED ORDER — FENTANYL CITRATE (PF) 100 MCG/2ML IJ SOLN
INTRAMUSCULAR | Status: AC
  Filled 2023-11-10: qty 2

## 2023-11-10 MED ORDER — HEPARIN SODIUM (PORCINE) 1000 UNIT/ML IJ SOLN
INTRAMUSCULAR | Status: DC | PRN
  Administered 2023-11-10: 3500 [IU] via INTRAVENOUS

## 2023-11-10 MED ORDER — MIDAZOLAM HCL 2 MG/2ML IJ SOLN
INTRAMUSCULAR | Status: DC | PRN
  Administered 2023-11-10: 1 mg via INTRAVENOUS

## 2023-11-10 MED ORDER — IOHEXOL 300 MG/ML  SOLN
INTRAMUSCULAR | Status: DC | PRN
  Administered 2023-11-10: 45 mL

## 2023-11-10 MED ORDER — SODIUM CHLORIDE 0.9 % IV SOLN: 250.0000 mL | INTRAVENOUS | Status: AC | PRN

## 2023-11-10 MED ORDER — HEPARIN SODIUM (PORCINE) 1000 UNIT/ML IJ SOLN
INTRAMUSCULAR | Status: AC
  Filled 2023-11-10: qty 10

## 2023-11-10 MED ORDER — LABETALOL HCL 5 MG/ML IV SOLN: 10.0000 mg | INTRAVENOUS | Status: AC | PRN

## 2023-11-10 MED ORDER — HYDRALAZINE HCL 20 MG/ML IJ SOLN: 10.0000 mg | INTRAMUSCULAR | Status: AC | PRN

## 2023-11-10 MED ORDER — FENTANYL CITRATE (PF) 100 MCG/2ML IJ SOLN
INTRAMUSCULAR | Status: DC | PRN
  Administered 2023-11-10: 25 ug via INTRAVENOUS

## 2023-11-10 MED ORDER — SODIUM CHLORIDE 0.9% FLUSH
3.0000 mL | Freq: Two times a day (BID) | INTRAVENOUS | Status: DC
  Administered 2023-11-10 – 2023-11-12 (×5): 3 mL via INTRAVENOUS

## 2023-11-10 MED ORDER — HEPARIN (PORCINE) IN NACL 1000-0.9 UT/500ML-% IV SOLN
INTRAVENOUS | Status: DC | PRN
  Administered 2023-11-10: 1000 mL

## 2023-11-10 MED ORDER — LIDOCAINE HCL (PF) 1 % IJ SOLN
INTRAMUSCULAR | Status: DC | PRN
  Administered 2023-11-10: 2 mL

## 2023-11-10 MED ORDER — LIDOCAINE HCL 1 % IJ SOLN
INTRAMUSCULAR | Status: AC
  Filled 2023-11-10: qty 20

## 2023-11-10 MED ORDER — FUROSEMIDE 10 MG/ML IJ SOLN
INTRAMUSCULAR | Status: AC
  Filled 2023-11-10: qty 4

## 2023-11-10 SURGICAL SUPPLY — 11 items
CATH 5FR JL3.5 JR4 ANG PIG MP (CATHETERS) IMPLANT
COVER PROBE ULTRASOUND 5X96 (MISCELLANEOUS) IMPLANT
DEVICE RAD TR BAND REGULAR (VASCULAR PRODUCTS) IMPLANT
DRAPE BRACHIAL (DRAPES) IMPLANT
GLIDESHEATH SLEND SS 6F .021 (SHEATH) IMPLANT
GUIDEWIRE INQWIRE 1.5J.035X260 (WIRE) IMPLANT
INQWIRE 1.5J .035X260CM (WIRE) ×1 IMPLANT
PACK CARDIAC CATH (CUSTOM PROCEDURE TRAY) ×1 IMPLANT
PROTECTION STATION PRESSURIZED (MISCELLANEOUS) ×1 IMPLANT
SET ATX-X65L (MISCELLANEOUS) IMPLANT
STATION PROTECTION PRESSURIZED (MISCELLANEOUS) IMPLANT

## 2023-11-10 NOTE — Progress Notes (Signed)
 ANTICOAGULATION CONSULT NOTE  Pharmacy Consult for heparin infusion Indication: ACS/STEMI  No Known Allergies  Patient Measurements: Height: 5\' 3"  (160 cm) Weight: 69.4 kg (153 lb) IBW/kg (Calculated) : 52.4 Heparin Dosing Weight: 66.7 kg  Vital Signs: Temp: 98.2 F (36.8 C) (03/12 2329) BP: 118/63 (03/12 2329) Pulse Rate: 80 (03/12 2329)  Labs: Recent Labs    11/07/23 0651 11/07/23 0651 11/07/23 0934 11/07/23 1145 11/07/23 1342 11/07/23 1631 11/07/23 2309 11/08/23 0445 11/09/23 0514 11/09/23 1450 11/10/23 0101  HGB SPECIMEN CONTAMINATED, UNABLE TO PERFORM TEST(S).  --   --  11.0*  --   --   --  10.0* 9.5*  --   --   HCT SPECIMEN CONTAMINATED, UNABLE TO PERFORM TEST(S).  --   --  29.9*  --   --   --  27.1* 26.5*  --   --   PLT SPECIMEN CONTAMINATED, UNABLE TO PERFORM TEST(S).  --   --  128*  --   --   --  127* 162  --   --   APTT  --   --  162*  --   --   --   --   --   --   --   --   LABPROT  --   --  15.6*  --   --   --   --   --   --   --   --   INR  --   --  1.2  --   --   --   --   --   --   --   --   HEPARINUNFRC  --   --  0.58  --   --   --    < > 0.38 0.20* 0.26* 0.53  CREATININE  --    < > 0.87  --  0.71  --   --  0.82 0.75  --   --   TROPONINIHS 6,095*  --   --   --   --  3,144*  --   --   --   --   --    < > = values in this interval not displayed.   Estimated Creatinine Clearance: 54.2 mL/min (by C-G formula based on SCr of 0.75 mg/dL).  Medical History: Past Medical History:  Diagnosis Date   COVID-19 2021   GERD (gastroesophageal reflux disease)    Hypertension    Wears dentures    full upper and lower   Assessment: Pt is a 78 yo female presenting to ED w/ AMS, currently being treated for UTI, found with elevated Troponin I level and now evaluated for NSTEMI. Per chart review, patient is not on anticoagulation therapy at home. Pharmacy has been consulted to manage this patient's heparin.  Baseline Labs INR/PT 1.2/15.6 Plt 123 Hgb 11.4   Monitor drop in hgb 8.7   Goal of Therapy:  Heparin level 0.3-0.7 units/ml Monitor platelets by anticoagulation protocol: Yes  Heparin Levels Date/Time  HL  Clinical Assessment 3/10@0934   0.58  Therapeutic x 1 3/10@2309                  0.38                Therapeutic X 2  3/11@0445                  0.38                Therapeutic X 3  3/12@0514   0.20                SUBtherapeutic  3/12@1450   0.26  SUB-therapeutic 3/13@0101                  0.53                Therapeutic X 1    Plan:  3/13:  HL @ 0101 = 0.53, therapeutic X 1 - Will continue pt on current rate and recheck HL in 8 hrs.  CBC with AM labs  Thank you for allowing pharmacy to participate in this patient's care.   Fredricka Kohrs D 11/10/2023 1:37 AM

## 2023-11-10 NOTE — Progress Notes (Signed)
 Progress Note  Patient Name: Amanda Cruz Date of Encounter: 11/10/2023  Primary Cardiologist: New - consult by Kirke Corin  Subjective   No chest pain or dyspnea this morning. Feels weak. NPO for cardiac cath today. No further runs of SVT following transition from albuterol to Xopenex.   Inpatient Medications    Scheduled Meds:  Ferrous Fumarate  1 tablet Oral Daily   And   ascorbic acid  250 mg Oral Daily   [START ON 11/11/2023] aspirin EC  81 mg Oral Daily   atorvastatin  80 mg Oral Daily   budesonide (PULMICORT) nebulizer solution  0.25 mg Nebulization BID   Chlorhexidine Gluconate Cloth  6 each Topical Daily   cholecalciferol  1,000 Units Oral Daily   metoprolol tartrate  50 mg Oral BID   pantoprazole (PROTONIX) IV  40 mg Intravenous Q12H   Continuous Infusions:  cefTRIAXone (ROCEPHIN)  IV 2 g (11/09/23 2059)   heparin 1,050 Units/hr (11/09/23 1610)   PRN Meds: acetaminophen, levalbuterol, magnesium hydroxide, nitroGLYCERIN, ondansetron (ZOFRAN) IV, mouth rinse, polyvinyl alcohol, traZODone   Vital Signs    Vitals:   11/09/23 2159 11/09/23 2329 11/10/23 0335 11/10/23 0740  BP:  118/63 (!) 152/73 (!) 157/73  Pulse:  80 87 96  Resp:  20 18 16   Temp:  98.2 F (36.8 C) 98 F (36.7 C) 97.8 F (36.6 C)  TempSrc:      SpO2: 100% 98% 96% 98%  Weight:      Height:        Intake/Output Summary (Last 24 hours) at 11/10/2023 0939 Last data filed at 11/09/2023 1900 Gross per 24 hour  Intake 480 ml  Output 1600 ml  Net -1120 ml   Filed Weights   11/06/23 2349  Weight: 69.4 kg    Telemetry    SR, 80s bpm - Personally Reviewed  ECG    No new tracings - Personally Reviewed  Physical Exam   GEN: No acute distress.   Neck: No JVD. Cardiac: RRR, no murmurs, rubs, or gallops.  Respiratory: Clear to auscultation bilaterally.  GI: Soft, nontender, non-distended.   MS: No edema; No deformity. Neuro:  Alert and oriented x 3; Nonfocal.  Psych: Normal  affect.  Labs    Chemistry Recent Labs  Lab 11/07/23 0002 11/07/23 0934 11/07/23 1342 11/08/23 0445 11/09/23 0514 11/10/23 0509  NA 125* 130*   < > 132* 134* 137  K 2.6* 3.3*   < > 3.2* 3.7 4.2  CL 92* 96*   < > 100 103 107  CO2 22 22   < > 22 22 23   GLUCOSE 153* 196*   < > 132* 134* 101*  BUN 12 11   < > 11 13 13   CREATININE 0.88 0.87   < > 0.82 0.75 0.68  CALCIUM 8.1* 7.5*   < > 7.6* 7.8* 8.0*  PROT 6.1* 5.8*  --   --   --   --   ALBUMIN 3.2* 2.7*  --   --   --   --   AST 43* 83*  --   --   --   --   ALT 29 42  --   --   --   --   ALKPHOS 62 89  --   --   --   --   BILITOT 0.7 0.5  --   --   --   --   GFRNONAA >60 >60   < > >60 >60 >60  ANIONGAP 11 12   < > 10 9 7    < > = values in this interval not displayed.     Hematology Recent Labs  Lab 11/08/23 0445 11/09/23 0514 11/10/23 0509  WBC 7.1 9.1 6.5  RBC 3.24* 3.05* 3.69*  HGB 10.0* 9.5* 11.3*  HCT 27.1* 26.5* 32.0*  MCV 83.6 86.9 86.7  MCH 30.9 31.1 30.6  MCHC 36.9* 35.8 35.3  RDW 11.9 12.2 12.5  PLT 127* 162 225    Cardiac EnzymesNo results for input(s): "TROPONINI" in the last 168 hours. No results for input(s): "TROPIPOC" in the last 168 hours.   BNP Recent Labs  Lab 11/07/23 0001  BNP 236.1*     DDimer No results for input(s): "DDIMER" in the last 168 hours.   Radiology    No results found.  Cardiac Studies   2D echo 11/07/2023: 1. Left ventricular ejection fraction, by estimation, is 50 to 55%. The  left ventricle has low normal function. The left ventricle has no regional  wall motion abnormalities. There is moderate left ventricular hypertrophy.  Left ventricular diastolic  parameters are indeterminate.   2. Right ventricular systolic function is normal. The right ventricular  size is normal. There is normal pulmonary artery systolic pressure.   3. The mitral valve is normal in structure. Moderate mitral valve  regurgitation. No evidence of mitral stenosis.   4. The aortic valve is  normal in structure. Aortic valve regurgitation is  not visualized. No aortic stenosis is present.   5. The inferior vena cava is normal in size with greater than 50%  respiratory variability, suggesting right atrial pressure of 3 mmHg.   Patient Profile     78 y.o. female with history of HTN, HLD, chronic venous insufficiency, and GERD admitted with septic shock secondary to E coli bacteremia with admission complicated by NSTEMI, PSVT, and electrolyte derangements who we are seeing for NSTEMI and PSVT.  Assessment & Plan    1.  NSTEMI: -Felt to be in the setting of septic shock with E. coli bacteremia with troponin up to 6095 with subsequent downtrend -Currently without symptoms of angina or cardiac decompensation -Echo this admission with low normal LV systolic function with normal wall motion -N.p.o. for LHC today -IV heparin -Aspirin 81 mg, atorvastatin 80 mg, metoprolol 50 mg twice daily -Further recommendations pending cath  2.  PSVT: -Burden significantly improved following transition from albuterol to Xopenex -Continue titrated dose of Lopressor 50 mg twice daily -Maintain potassium and magnesium at goal 4.0 and 2.0 respectively -TSH normal -Echo with low normal LV systolic function -For now, defer initiation of AAD -Monitor on telemetry  3.  HTN: -Blood pressure mildly elevated this morning in the 150s systolic -Monitor following cardiac cath with recommendation to escalate pharmacotherapy as indicated  4.  HLD: -LDL 46 in 01/2022 -Update lipid panel -On atorvastatin 80 mg   Informed Consent   Shared Decision Making/Informed Consent{  The risks [stroke (1 in 1000), death (1 in 1000), kidney failure [usually temporary] (1 in 500), bleeding (1 in 200), allergic reaction [possibly serious] (1 in 200)], benefits (diagnostic support and management of coronary artery disease) and alternatives of a cardiac catheterization were discussed in detail with Ms. Winsett and she  is willing to proceed.      For questions or updates, please contact [facility] Please consult [email or web address] for contact info under Cardiology/STEMI.    Signed, Eula Listen, PA-C [facility] Pager: [phone] 11/10/2023, 9:39 AM

## 2023-11-10 NOTE — Consult Note (Signed)
 PHARMACY CONSULT NOTE - FOLLOW UP  Pharmacy Consult for Electrolyte Monitoring and Replacement   Recent Labs: Potassium (mmol/L)  Date Value  11/10/2023 4.2   Magnesium (mg/dL)  Date Value  09/81/1914 2.1   Calcium (mg/dL)  Date Value  78/29/5621 8.0 (L)   Albumin (g/dL)  Date Value  30/86/5784 2.7 (L)   Phosphorus (mg/dL)  Date Value  69/62/9528 2.3 (L)   Sodium (mmol/L)  Date Value  11/10/2023 137   Assessment: EA is a 78 yo female who presented to the ED 3/9 with altered mental status and shortness of breath with fever and septic shock. Paroxysmal tach/SVT and NSTEMI in setting of septic shock. Pharmacy consulted to optimize electrolytes per cards recommendation.  Goal of Therapy:  Potassium >4, Magnesium >2 All Other Electrolytes WNL  Plan:  No other replacement indicated at this time BMP with AM labs  Thank you for allowing pharmacy to participate in this patient's care.   Melvyn Hommes Rodriguez-Guzman PharmD, BCPS 11/10/2023 9:05 AM

## 2023-11-10 NOTE — Progress Notes (Addendum)
 Progress Note    KENORA SPAYD  ZOX:[id number] DOB: 1946-06-03  DOA: 11/06/2023 PCP: Corky Downs, MD      Brief Narrative:    Medical records reviewed and are as summarized below:  [name] is a 78 y.o. female with medical history significant for GERD, hypertension sigmoid polyp and chronic venous insufficiency, who presented to the emergency room with acute onset of altered mental status with associated dyspnea and fever.  She was recently diagnosed with UTI and went to urgent care on Friday.  Upon arriving to hospital, patient had significant tachycardia, tachypnea, elevated procalcitonin level, she also had a significant elevation troponin of 5416.  She developed severe hypotension, was sent to ICU for Levophed and fluid bolus.  She was treated with IV ceftriaxone for septic shock and E. coli bacteremia.  She was also treated with IV heparin drip for significant elevated troponins and suspected acute NSTEMI.  She underwent left heart catheterization which did not show any evidence of coronary artery disease.  Acute NSTEMI was ruled out.  Elevated troponins were attributed to demand ischemia.     Assessment/Plan:   Principal Problem:   Septic shock (HCC) Active Problems:   Demand ischemia (HCC)   Hypokalemia   Hypomagnesemia   Essential hypertension   Dyslipidemia   Lung nodule   Hyponatremia   E. coli septicemia (HCC)   SVT (supraventricular tachycardia) (HCC)   Body mass index is 27.1 kg/m.    Septic shock,E. coli bacteremia, acute UTI: S/p treatment with 3 doses of IV ceftriaxone.  Start amoxicillin 1 g 3 times daily and continue for 4 days. Procalcitonin was 36.54.   Elevated troponins, demand ischemia: Peak troponin 6,095.  S/p left heart cath on 11/10/2023 with did not show any evidence of significant CAD but there was evidence of mildly to moderately elevated left ventricular filling pressure (18 mm) suggestive of diastolic dysfunction.   Elevated troponins likely from demand ischemia.  Continue aspirin and Lipitor. Cardiogenic shock felt to be unlikely   Acute diastolic heart failure: Patient was given IV Lasix at the Cath Lab on 11/10/2023. 2D echo on 11/07/2023 showed EF estimated at 50 to 55%, indeterminate LV diastolic parameters, moderate MR   Paroxysmal tachycardia/SVT: She converted to NSR.  Continue metoprolol.   Hypokalemia, hyponatremia and hypomagnesemia: Improved   Anemia with thrombocytopenia: Improved.  Probably from acute illness.     Comorbidities include dyslipidemia, hypertension    Diet Order             Diet Heart Room service appropriate? Yes; Fluid consistency: Thin  Diet effective now                            Consultants: Cardiologist  Procedures: Left heart cath on 11/10/2023    Medications:    amoxicillin  1,000 mg Oral Q8H   [MAR Hold] Ferrous Fumarate  1 tablet Oral Daily   And   [MAR Hold] ascorbic acid  250 mg Oral Daily   [MAR Hold] aspirin EC  81 mg Oral Daily   [MAR Hold] atorvastatin  80 mg Oral Daily   [MAR Hold] budesonide (PULMICORT) nebulizer solution  0.25 mg Nebulization BID   [MAR Hold] Chlorhexidine Gluconate Cloth  6 each Topical Daily   [MAR Hold] cholecalciferol  1,000 Units Oral Daily   [START ON 11/11/2023] enoxaparin (LOVENOX) injection  40 mg Subcutaneous Q24H   [MAR Hold] metoprolol tartrate  50  mg Oral BID   [MAR Hold] pantoprazole (PROTONIX) IV  40 mg Intravenous Q12H   sodium chloride flush  3 mL Intravenous Q12H   Continuous Infusions:  sodium chloride       Anti-infectives (From admission, onward)    Start     Dose/Rate Route Frequency Ordered Stop   11/10/23 1800  amoxicillin (AMOXIL) capsule 1,000 mg        1,000 mg Oral Every 8 hours 11/10/23 1426 11/14/23 2159   11/07/23 2000  cefTRIAXone (ROCEPHIN) 2 g in sodium chloride 0.9 % 100 mL IVPB  Status:  Discontinued        2 g 200 mL/hr over 30 Minutes Intravenous Every  24 hours 11/07/23 1225 11/10/23 1426   11/07/23 1800  ceFEPIme (MAXIPIME) 1 g in sodium chloride 0.9 % 100 mL IVPB  Status:  Discontinued        1 g 200 mL/hr over 30 Minutes Intravenous Every 12 hours 11/07/23 0523 11/07/23 0527   11/07/23 1200  ceFEPIme (MAXIPIME) 1 g in sodium chloride 0.9 % 100 mL IVPB  Status:  Discontinued        1 g 200 mL/hr over 30 Minutes Intravenous Every 12 hours 11/07/23 0527 11/07/23 1059   11/07/23 1200  ceFEPIme (MAXIPIME) 2 g in sodium chloride 0.9 % 100 mL IVPB  Status:  Discontinued        2 g 200 mL/hr over 30 Minutes Intravenous Every 12 hours 11/07/23 1059 11/07/23 1225   11/07/23 0530  ceFEPIme (MAXIPIME) 2 g in sodium chloride 0.9 % 100 mL IVPB  Status:  Discontinued        2 g 200 mL/hr over 30 Minutes Intravenous  Once 11/07/23 0519 11/07/23 0527   11/07/23 0530  ceFEPIme (MAXIPIME) 2 g in sodium chloride 0.9 % 100 mL IVPB  Status:  Discontinued        2 g 200 mL/hr over 30 Minutes Intravenous  Once 11/07/23 0526 11/07/23 0527   11/07/23 0215  cefTRIAXone (ROCEPHIN) 2 g in sodium chloride 0.9 % 100 mL IVPB        2 g 200 mL/hr over 30 Minutes Intravenous  Once 11/07/23 0209 11/07/23 0341              Family Communication/Anticipated D/C date and plan/Code Status   DVT prophylaxis: enoxaparin (LOVENOX) injection 40 mg Start: 11/11/23 0800     Code Status: Full Code  Family Communication: None Disposition Plan: Plan to discharge home   Status is: Inpatient Remains inpatient appropriate because: E. coli bacteremia       Subjective:   Interval events noted.  She complains of shortness of breath with little activity.  Objective:    Vitals:   11/10/23 1300 11/10/23 1315 11/10/23 1330 11/10/23 1345  BP: 137/64 (!) 117/50 (!) 150/59 (!) 121/49  Pulse: 79 72 75 74  Resp: 19 16 16 16   Temp:      TempSrc:      SpO2: 97% 97% 97% 98%  Weight:      Height:       No data found.   Intake/Output Summary (Last 24 hours) at  11/10/2023 1442 Last data filed at 11/09/2023 1900 Gross per 24 hour  Intake 480 ml  Output 400 ml  Net 80 ml   Filed Weights   11/06/23 2349  Weight: 69.4 kg    Exam:  GEN: NAD SKIN: Warm and dry EYES: No pallor or icterus ENT: MMM CV: RRR PULM: CTA  B ABD: soft, ND, NT, +BS CNS: AAO x 3, non focal EXT: No edema or tenderness       Data Reviewed:   I have personally reviewed following labs and imaging studies:  Labs: Labs show the following:   Basic Metabolic Panel: Recent Labs  Lab 11/07/23 0001 11/07/23 0002 11/07/23 0639 11/07/23 0934 11/07/23 1342 11/07/23 1806 11/08/23 0445 11/09/23 0514 11/10/23 0509  NA  --    < >  --  130* 131*  --  132* 134* 137  K  --    < >  --  3.3* 4.1 4.0 3.2* 3.7 4.2  CL  --    < >  --  96* 100  --  100 103 107  CO2  --    < >  --  22 19*  --  22 22 23   GLUCOSE  --    < >  --  196* 131*  --  132* 134* 101*  BUN  --    < >  --  11 10  --  11 13 13   CREATININE  --    < >  --  0.87 0.71  --  0.82 0.75 0.68  CALCIUM  --    < >  --  7.5* 7.6*  --  7.6* 7.8* 8.0*  MG 1.4*  --   --   --   --  2.8* 2.6* 2.3 2.1  PHOS  --   --  1.8*  --   --   --  2.8 2.4* 2.3*   < > = values in this interval not displayed.   GFR Estimated Creatinine Clearance: 54.2 mL/min (by C-G formula based on SCr of 0.68 mg/dL). Liver Function Tests: Recent Labs  Lab 11/07/23 0002 11/07/23 0934  AST 43* 83*  ALT 29 42  ALKPHOS 62 89  BILITOT 0.7 0.5  PROT 6.1* 5.8*  ALBUMIN 3.2* 2.7*   No results for input(s): "LIPASE", "AMYLASE" in the last 168 hours. No results for input(s): "AMMONIA" in the last 168 hours. Coagulation profile Recent Labs  Lab 11/07/23 0001 11/07/23 0934  INR 1.0 1.2    CBC: Recent Labs  Lab 11/07/23 0001 11/07/23 0651 11/07/23 1145 11/08/23 0445 11/09/23 0514 11/10/23 0509  WBC 4.2 SPECIMEN CONTAMINATED, UNABLE TO PERFORM TEST(S). 11.4* 7.1 9.1 6.5  NEUTROABS 3.9 SPECIMEN CONTAMINATED, UNABLE TO PERFORM TEST(S).   --   --   --   --   HGB 11.4* SPECIMEN CONTAMINATED, UNABLE TO PERFORM TEST(S). 11.0* 10.0* 9.5* 11.3*  HCT 30.9* SPECIMEN CONTAMINATED, UNABLE TO PERFORM TEST(S). 29.9* 27.1* 26.5* 32.0*  MCV 83.7 SPECIMEN CONTAMINATED, UNABLE TO PERFORM TEST(S). 84.7 83.6 86.9 86.7  PLT 123* SPECIMEN CONTAMINATED, UNABLE TO PERFORM TEST(S). 128* 127* 162 225   Cardiac Enzymes: No results for input(s): "CKTOTAL", "CKMB", "CKMBINDEX", "TROPONINI" in the last 168 hours. BNP (last 3 results) No results for input(s): "PROBNP" in the last 8760 hours. CBG: Recent Labs  Lab 11/07/23 0842  GLUCAP 185*   D-Dimer: No results for input(s): "DDIMER" in the last 72 hours. Hgb A1c: No results for input(s): "HGBA1C" in the last 72 hours. Lipid Profile: No results for input(s): "CHOL", "HDL", "LDLCALC", "TRIG", "CHOLHDL", "LDLDIRECT" in the last 72 hours. Thyroid function studies: No results for input(s): "TSH", "T4TOTAL", "T3FREE", "THYROIDAB" in the last 72 hours.  Invalid input(s): "FREET3"  Anemia work up: No results for input(s): "VITAMINB12", "FOLATE", "FERRITIN", "TIBC", "IRON", "RETICCTPCT" in the last 72 hours. Sepsis Labs: Recent Labs  Lab 11/07/23 0001 11/07/23 0002 11/07/23 0651 11/07/23 0934 11/07/23 1145 11/08/23 0445 11/09/23 0514 11/10/23 0509  PROCALCITON  --  2.12  --   --   --  36.54  --   --   WBC 4.2  --  SPECIMEN CONTAMINATED, UNABLE TO PERFORM TEST(S).  --  11.4* 7.1 9.1 6.5  LATICACIDVEN 1.4  --  1.3 1.9 1.7  --   --   --     Microbiology Recent Results (from the past 240 hours)  Blood Culture (routine x 2)     Status: Abnormal   Collection Time: 11/07/23 12:03 AM   Specimen: BLOOD RIGHT ARM  Result Value Ref Range Status   Specimen Description   Final    BLOOD RIGHT ARM Performed at [facility], [address] [place], [place] [address]    Special Requests   Final    BOTTLES DRAWN AEROBIC AND ANAEROBIC Blood Culture adequate volume Performed at [facility], [address] [place], [place] [address]    Culture  Setup Time   Final    GRAM NEGATIVE RODS IN BOTH AEROBIC AND ANAEROBIC BOTTLES CRITICAL VALUE NOTED.  VALUE IS CONSISTENT WITH PREVIOUSLY REPORTED AND CALLED VALUE. Performed at [facility], [address] [place], [place] [address]    Culture (A)  Final    ESCHERICHIA COLI SUSCEPTIBILITIES PERFORMED ON PREVIOUS CULTURE WITHIN THE LAST 5 DAYS. Performed at [facility], [address] [place], [place] [address]    Report Status 11/09/2023 FINAL  Final  Blood Culture (routine x 2)     Status: Abnormal   Collection Time: 11/07/23 12:03 AM   Specimen: BLOOD LEFT ARM  Result Value Ref Range Status   Specimen Description   Final    BLOOD LEFT ARM Performed at [facility], [address] [place], [place] [address]    Special Requests   Final    BOTTLES DRAWN AEROBIC AND ANAEROBIC Blood Culture results may not be optimal due to an inadequate volume of blood received in culture bottles Performed at [facility], [address] [place], [place] [address]    Culture  Setup Time   Final    GRAM NEGATIVE RODS IN BOTH AEROBIC AND ANAEROBIC BOTTLES CRITICAL RESULT CALLED TO, READ BACK BY AND VERIFIED WITHMila Merry PHARMD 1132 11/07/23 HNM Performed at [facility], [address], [place], [place] [address]    Culture ESCHERICHIA COLI (A)  Final   Report Status 11/09/2023 FINAL  Final   Organism ID, Bacteria ESCHERICHIA COLI  Final   Organism ID, Bacteria ESCHERICHIA COLI  Final      Susceptibility   Escherichia coli - KIRBY BAUER*    CEFAZOLIN SENSITIVE Sensitive    Escherichia coli - MIC*    AMPICILLIN <=2 SENSITIVE Sensitive     CEFEPIME <=0.12 SENSITIVE Sensitive     CEFTAZIDIME <=1 SENSITIVE Sensitive     CEFTRIAXONE <=0.25 SENSITIVE Sensitive     CIPROFLOXACIN <=0.25 SENSITIVE Sensitive     GENTAMICIN <=1 SENSITIVE Sensitive     IMIPENEM <=0.25 SENSITIVE  Sensitive     TRIMETH/SULFA <=20 SENSITIVE Sensitive     AMPICILLIN/SULBACTAM <=2 SENSITIVE Sensitive     PIP/TAZO <=4 SENSITIVE Sensitive ug/mL    * ESCHERICHIA COLI    ESCHERICHIA COLI  Blood Culture ID Panel (Reflexed)     Status: Abnormal   Collection Time: 11/07/23 12:03 AM  Result Value Ref Range Status  Enterococcus faecalis NOT DETECTED NOT DETECTED Final   Enterococcus Faecium NOT DETECTED NOT DETECTED Final   Listeria monocytogenes NOT DETECTED NOT DETECTED Final   Staphylococcus species NOT DETECTED NOT DETECTED Final   Staphylococcus aureus (BCID) NOT DETECTED NOT DETECTED Final   Staphylococcus epidermidis NOT DETECTED NOT DETECTED Final   Staphylococcus lugdunensis NOT DETECTED NOT DETECTED Final   Streptococcus species NOT DETECTED NOT DETECTED Final   Streptococcus agalactiae NOT DETECTED NOT DETECTED Final   Streptococcus pneumoniae NOT DETECTED NOT DETECTED Final   Streptococcus pyogenes NOT DETECTED NOT DETECTED Final   A.calcoaceticus-baumannii NOT DETECTED NOT DETECTED Final   Bacteroides fragilis NOT DETECTED NOT DETECTED Final   Enterobacterales DETECTED (A) NOT DETECTED Final    Comment: Enterobacterales represent a large order of gram negative bacteria, not a single organism. CRITICAL RESULT CALLED TO, READ BACK BY AND VERIFIED WITH: Mila Merry PHARMD 1132 11/07/23 HNM    Enterobacter cloacae complex NOT DETECTED NOT DETECTED Final   Escherichia coli DETECTED (A) NOT DETECTED Final    Comment: CRITICAL RESULT CALLED TO, READ BACK BY AND VERIFIED WITH: Mila Merry PHARMD 1132 11/07/23 HNM    Klebsiella aerogenes NOT DETECTED NOT DETECTED Final   Klebsiella oxytoca NOT DETECTED NOT DETECTED Final   Klebsiella pneumoniae NOT DETECTED NOT DETECTED Final   Proteus species NOT DETECTED NOT DETECTED Final   Salmonella species NOT DETECTED NOT DETECTED Final   Serratia marcescens NOT DETECTED NOT DETECTED Final   Haemophilus influenzae NOT DETECTED NOT  DETECTED Final   Neisseria meningitidis NOT DETECTED NOT DETECTED Final   Pseudomonas aeruginosa NOT DETECTED NOT DETECTED Final   Stenotrophomonas maltophilia NOT DETECTED NOT DETECTED Final   Candida albicans NOT DETECTED NOT DETECTED Final   Candida auris NOT DETECTED NOT DETECTED Final   Candida glabrata NOT DETECTED NOT DETECTED Final   Candida krusei NOT DETECTED NOT DETECTED Final   Candida parapsilosis NOT DETECTED NOT DETECTED Final   Candida tropicalis NOT DETECTED NOT DETECTED Final   Cryptococcus neoformans/gattii NOT DETECTED NOT DETECTED Final   CTX-M ESBL NOT DETECTED NOT DETECTED Final   Carbapenem resistance IMP NOT DETECTED NOT DETECTED Final   Carbapenem resistance KPC NOT DETECTED NOT DETECTED Final   Carbapenem resistance NDM NOT DETECTED NOT DETECTED Final   Carbapenem resist OXA 48 LIKE NOT DETECTED NOT DETECTED Final   Carbapenem resistance VIM NOT DETECTED NOT DETECTED Final    Comment: Performed at [facility], [address] [place], [place] [address]  Resp panel by RT-PCR (RSV, Flu A&B, Covid) Anterior Nasal Swab     Status: None   Collection Time: 11/07/23  6:39 AM   Specimen: Anterior Nasal Swab  Result Value Ref Range Status   SARS Coronavirus 2 by RT PCR NEGATIVE NEGATIVE Final    Comment: (NOTE) SARS-CoV-2 target nucleic acids are NOT DETECTED.  The SARS-CoV-2 RNA is generally detectable in upper respiratory specimens during the acute phase of infection. The lowest concentration of SARS-CoV-2 viral copies this assay can detect is 138 copies/mL. A negative result does not preclude SARS-Cov-2 infection and should not be used as the sole basis for treatment or other patient management decisions. A negative result may occur with  improper specimen collection/handling, submission of specimen other than nasopharyngeal swab, presence of viral mutation(s) within the areas targeted by this assay, and inadequate number of viral copies(<138  copies/mL). A negative result must be combined with clinical observations, patient history, and epidemiological information. The expected result is Negative.  Fact Sheet for Patients:  BloggerCourse.com  Fact Sheet for Healthcare Providers:  SeriousBroker.it  This test is no t yet approved or cleared by the [place] FDA and  has been authorized for detection and/or diagnosis of SARS-CoV-2 by FDA under an Emergency Use Authorization (EUA). This EUA will remain  in effect (meaning this test can be used) for the duration of the COVID-19 declaration under Section 564(b)(1) of the Act, 21 U.S.C.section 360bbb-3(b)(1), unless the authorization is terminated  or revoked sooner.       Influenza A by PCR NEGATIVE NEGATIVE Final   Influenza B by PCR NEGATIVE NEGATIVE Final    Comment: (NOTE) The Xpert Xpress SARS-CoV-2/FLU/RSV plus assay is intended as an aid in the diagnosis of influenza from Nasopharyngeal swab specimens and should not be used as a sole basis for treatment. Nasal washings and aspirates are unacceptable for Xpert Xpress SARS-CoV-2/FLU/RSV testing.  Fact Sheet for Patients: BloggerCourse.com  Fact Sheet for Healthcare Providers: SeriousBroker.it  This test is not yet approved or cleared by the [place] FDA and has been authorized for detection and/or diagnosis of SARS-CoV-2 by FDA under an Emergency Use Authorization (EUA). This EUA will remain in effect (meaning this test can be used) for the duration of the COVID-19 declaration under Section 564(b)(1) of the Act, 21 U.S.C. section 360bbb-3(b)(1), unless the authorization is terminated or revoked.     Resp Syncytial Virus by PCR NEGATIVE NEGATIVE Final    Comment: (NOTE) Fact Sheet for Patients: BloggerCourse.com  Fact Sheet for Healthcare  Providers: SeriousBroker.it  This test is not yet approved or cleared by the [place] FDA and has been authorized for detection and/or diagnosis of SARS-CoV-2 by FDA under an Emergency Use Authorization (EUA). This EUA will remain in effect (meaning this test can be used) for the duration of the COVID-19 declaration under Section 564(b)(1) of the Act, 21 U.S.C. section 360bbb-3(b)(1), unless the authorization is terminated or revoked.  Performed at [facility], [address] [place], [place] [address]   Respiratory (~20 pathogens) panel by PCR     Status: None   Collection Time: 11/07/23  9:20 AM   Specimen: Nasopharyngeal Swab; Respiratory  Result Value Ref Range Status   Adenovirus NOT DETECTED NOT DETECTED Final   Coronavirus 229E NOT DETECTED NOT DETECTED Final    Comment: (NOTE) The Coronavirus on the Respiratory Panel, DOES NOT test for the novel  Coronavirus (2019 nCoV)    Coronavirus HKU1 NOT DETECTED NOT DETECTED Final   Coronavirus NL63 NOT DETECTED NOT DETECTED Final   Coronavirus OC43 NOT DETECTED NOT DETECTED Final   Metapneumovirus NOT DETECTED NOT DETECTED Final   Rhinovirus / Enterovirus NOT DETECTED NOT DETECTED Final   Influenza A NOT DETECTED NOT DETECTED Final   Influenza B NOT DETECTED NOT DETECTED Final   Parainfluenza Virus 1 NOT DETECTED NOT DETECTED Final   Parainfluenza Virus 2 NOT DETECTED NOT DETECTED Final   Parainfluenza Virus 3 NOT DETECTED NOT DETECTED Final   Parainfluenza Virus 4 NOT DETECTED NOT DETECTED Final   Respiratory Syncytial Virus NOT DETECTED NOT DETECTED Final   Bordetella pertussis NOT DETECTED NOT DETECTED Final   Bordetella Parapertussis NOT DETECTED NOT DETECTED Final   Chlamydophila pneumoniae NOT DETECTED NOT DETECTED Final   Mycoplasma pneumoniae NOT DETECTED NOT DETECTED Final    Comment: Performed at [facility], [address] [place], [place] [address]  Culture, blood  (x 2)     Status: None (Preliminary result)  Collection Time: 11/07/23  9:34 AM   Specimen: BLOOD  Result Value Ref Range Status   Specimen Description BLOOD RIGHT ANTECUBITAL  Final   Special Requests   Final    BOTTLES DRAWN AEROBIC ONLY Blood Culture adequate volume   Culture   Final    NO GROWTH 3 DAYS Performed at [facility], [address], [place], [place] [address]    Report Status PENDING  Incomplete  Culture, blood (x 2)     Status: None (Preliminary result)   Collection Time: 11/07/23  9:34 AM   Specimen: BLOOD  Result Value Ref Range Status   Specimen Description BLOOD BLOOD RIGHT ARM  Final   Special Requests   Final    BOTTLES DRAWN AEROBIC AND ANAEROBIC Blood Culture results may not be optimal due to an inadequate volume of blood received in culture bottles   Culture   Final    NO GROWTH 3 DAYS Performed at [facility], [address] [place], [place] [address]    Report Status PENDING  Incomplete  MRSA Next Gen by PCR, Nasal     Status: None   Collection Time: 11/07/23 12:35 PM   Specimen: Nasal Mucosa; Nasal Swab  Result Value Ref Range Status   MRSA by PCR Next Gen NOT DETECTED NOT DETECTED Final    Comment: (NOTE) The GeneXpert MRSA Assay (FDA approved for NASAL specimens only), is one component of a comprehensive MRSA colonization surveillance program. It is not intended to diagnose MRSA infection nor to guide or monitor treatment for MRSA infections. Test performance is not FDA approved in patients less than 57 years old. Performed at [facility], [address] [place], [place] [address]   C Difficile Quick Screen w PCR reflex     Status: None   Collection Time: 11/07/23  4:11 PM   Specimen: STOOL  Result Value Ref Range Status   C Diff antigen NEGATIVE NEGATIVE Final   C Diff toxin NEGATIVE NEGATIVE Final   C Diff interpretation No C. difficile detected.  Final    Comment: Performed at [facility], [address] [place], [place] [address]    Procedures and diagnostic studies:  CARDIAC CATHETERIZATION Result Date: 11/10/2023 Conclusions: No angiographically significant coronary artery disease.  I suspect elevated troponin on admission was due to supply-demand mismatch in the setting of septic shock from E. coli bacteremia. Mildly to moderately elevated left ventricular filling pressure (18 mmHg, 30 mmHg post a-wave) suggestive of diastolic dysfunction. Recommendations: Gentle diuresis. Primary prevention of coronary artery disease. Ongoing treatment of E. coli bacteremia per primary team. Yvonne Kendall, MD [facility]              LOS: 3 days   Obrien Huskins  [facility]   Pager on [email or web address]. If 7PM-7AM, please contact night-coverage at [email or web address]     11/10/2023, 2:42 PM

## 2023-11-10 NOTE — Plan of Care (Signed)
  Problem: Education: Goal: Understanding of cardiac disease, CV risk reduction, and recovery process will improve Outcome: Progressing Goal: Individualized Educational Video(s) Outcome: Progressing   Problem: Activity: Goal: Ability to tolerate increased activity will improve Outcome: Progressing   Problem: Cardiac: Goal: Ability to achieve and maintain adequate cardiovascular perfusion will improve Outcome: Progressing   Problem: Health Behavior/Discharge Planning: Goal: Ability to safely manage health-related needs after discharge will improve Outcome: Progressing   Problem: Fluid Volume: Goal: Hemodynamic stability will improve Outcome: Progressing   Problem: Clinical Measurements: Goal: Diagnostic test results will improve Outcome: Progressing Goal: Signs and symptoms of infection will decrease Outcome: Progressing   Problem: Respiratory: Goal: Ability to maintain adequate ventilation will improve Outcome: Progressing   Problem: Education: Goal: Knowledge of General Education information will improve Description: Including pain rating scale, medication(s)/side effects and non-pharmacologic comfort measures Outcome: Progressing   Problem: Health Behavior/Discharge Planning: Goal: Ability to manage health-related needs will improve Outcome: Progressing   Problem: Clinical Measurements: Goal: Ability to maintain clinical measurements within normal limits will improve Outcome: Progressing Goal: Will remain free from infection Outcome: Progressing Goal: Diagnostic test results will improve Outcome: Progressing Goal: Respiratory complications will improve Outcome: Progressing Goal: Cardiovascular complication will be avoided Outcome: Progressing   Problem: Activity: Goal: Risk for activity intolerance will decrease Outcome: Progressing   Problem: Nutrition: Goal: Adequate nutrition will be maintained Outcome: Progressing   Problem: Coping: Goal: Level of  anxiety will decrease Outcome: Progressing   Problem: Elimination: Goal: Will not experience complications related to bowel motility Outcome: Progressing Goal: Will not experience complications related to urinary retention Outcome: Progressing   Problem: Pain Managment: Goal: General experience of comfort will improve and/or be controlled Outcome: Progressing   Problem: Safety: Goal: Ability to remain free from injury will improve Outcome: Progressing   Problem: Skin Integrity: Goal: Risk for impaired skin integrity will decrease Outcome: Progressing   Problem: Education: Goal: Understanding of CV disease, CV risk reduction, and recovery process will improve Outcome: Progressing Goal: Individualized Educational Video(s) Outcome: Progressing   Problem: Activity: Goal: Ability to return to baseline activity level will improve Outcome: Progressing   Problem: Cardiovascular: Goal: Ability to achieve and maintain adequate cardiovascular perfusion will improve Outcome: Progressing Goal: Vascular access site(s) Level 0-1 will be maintained Outcome: Progressing   Problem: Health Behavior/Discharge Planning: Goal: Ability to safely manage health-related needs after discharge will improve Outcome: Progressing

## 2023-11-10 NOTE — Interval H&P Note (Signed)
 History and Physical Interval Note:  11/10/2023 11:31 AM  Amanda Cruz  has presented today for surgery, with the diagnosis of NSTEMI.  The various methods of treatment have been discussed with the patient and family. After consideration of risks, benefits and other options for treatment, the patient has consented to  Procedure(s): LEFT HEART CATH AND CORONARY ANGIOGRAPHY (N/A) as a surgical intervention.  The patient's history has been reviewed, patient examined, no change in status, stable for surgery.  I have reviewed the patient's chart and labs.  Questions were answered to the patient's satisfaction.    Cath Lab Visit (complete for each Cath Lab visit)  Clinical Evaluation Leading to the Procedure:   ACS: Yes.    Non-ACS:  N/A  Aleza Pew

## 2023-11-10 NOTE — Progress Notes (Signed)
 ANTICOAGULATION CONSULT NOTE  Pharmacy Consult for heparin infusion Indication: ACS/STEMI  No Known Allergies  Patient Measurements: Height: 5\' 3"  (160 cm) Weight: 69.4 kg (153 lb) IBW/kg (Calculated) : 52.4 Heparin Dosing Weight: 66.7 kg  Vital Signs: Temp: 97.8 F (36.6 C) (03/13 0740) BP: 157/73 (03/13 0740) Pulse Rate: 96 (03/13 0740)  Labs: Recent Labs    11/07/23 0934 11/07/23 1145 11/07/23 1631 11/07/23 2309 11/08/23 0445 11/09/23 0514 11/09/23 1450 11/10/23 0101 11/10/23 0509 11/10/23 0910  HGB  --    < >  --   --  10.0* 9.5*  --   --  11.3*  --   HCT  --    < >  --   --  27.1* 26.5*  --   --  32.0*  --   PLT  --    < >  --   --  127* 162  --   --  225  --   APTT 162*  --   --   --   --   --   --   --   --   --   LABPROT 15.6*  --   --   --   --   --   --   --   --   --   INR 1.2  --   --   --   --   --   --   --   --   --   HEPARINUNFRC 0.58  --   --    < > 0.38 0.20* 0.26* 0.53  --  0.39  CREATININE 0.87   < >  --   --  0.82 0.75  --   --  0.68  --   TROPONINIHS  --   --  3,144*  --   --   --   --   --   --   --    < > = values in this interval not displayed.   Estimated Creatinine Clearance: 54.2 mL/min (by C-G formula based on SCr of 0.68 mg/dL).  Medical History: Past Medical History:  Diagnosis Date   COVID-19 2021   GERD (gastroesophageal reflux disease)    Hypertension    Wears dentures    full upper and lower   Assessment: Pt is a 78 yo female presenting to ED w/ AMS, currently being treated for UTI, found with elevated Troponin I level and now evaluated for NSTEMI. Per chart review, patient is not on anticoagulation therapy at home. Pharmacy has been consulted to manage this patient's heparin.  Baseline Labs INR/PT 1.2/15.6 Plt 123 Hgb 11.4  Monitor drop in hgb 8.7   Goal of Therapy:  Heparin level 0.3-0.7 units/ml Monitor platelets by anticoagulation protocol: Yes  Heparin Levels Date/Time  HL  Clinical  Assessment 3/10@0934   0.58  Therapeutic x 1 3/10@2309                  0.38                Therapeutic X 2  3/11@0445                  0.38                Therapeutic X 3  3/12@0514                  0.20                SUBtherapeutic  3/12@1450   0.26  SUB-therapeutic 3/13@0101                  0.53                Therapeutic X 1  3/13@0910   0.39  Therapeutic x2, heparin @ 1050 units/hour   Plan:  Continue heparin infusion at 1050 units per hour. Plan to repeat HL in AM if infusion continued after cardiac cath today. CBC with AM labs  Thank you for allowing pharmacy to participate in this patient's care.   Perlita Forbush Rodriguez-Guzman PharmD, BCPS 11/10/2023 9:34 AM

## 2023-11-11 DIAGNOSIS — I214 Non-ST elevation (NSTEMI) myocardial infarction: Secondary | ICD-10-CM | POA: Diagnosis not present

## 2023-11-11 DIAGNOSIS — A419 Sepsis, unspecified organism: Secondary | ICD-10-CM | POA: Diagnosis not present

## 2023-11-11 DIAGNOSIS — R6521 Severe sepsis with septic shock: Secondary | ICD-10-CM | POA: Diagnosis not present

## 2023-11-11 LAB — BASIC METABOLIC PANEL
Anion gap: 11 (ref 5–15)
BUN: 13 mg/dL (ref 8–23)
CO2: 25 mmol/L (ref 22–32)
Calcium: 8.5 mg/dL — ABNORMAL LOW (ref 8.9–10.3)
Chloride: 100 mmol/L (ref 98–111)
Creatinine, Ser: 0.83 mg/dL (ref 0.44–1.00)
GFR, Estimated: >60 mL/min (ref >60)
Glucose, Bld: 104 mg/dL — ABNORMAL HIGH (ref 70–99)
Potassium: 3.9 mmol/L (ref 3.5–5.1)
Sodium: 136 mmol/L (ref 135–145)

## 2023-11-11 MED ORDER — IRBESARTAN 150 MG PO TABS
75.0000 mg | ORAL_TABLET | Freq: Every day | ORAL | Status: DC
Start: 2023-11-11 — End: 2023-11-12
  Administered 2023-11-11 – 2023-11-12 (×2): 75 mg via ORAL
  Filled 2023-11-11 (×2): qty 1

## 2023-11-11 MED ORDER — POTASSIUM CHLORIDE CRYS ER 20 MEQ PO TBCR
40.0000 meq | EXTENDED_RELEASE_TABLET | Freq: Once | ORAL | Status: AC
  Administered 2023-11-11: 40 meq via ORAL
  Filled 2023-11-11: qty 2

## 2023-11-11 MED ORDER — FUROSEMIDE 10 MG/ML IJ SOLN
40.0000 mg | Freq: Once | INTRAMUSCULAR | Status: AC
  Administered 2023-11-11: 40 mg via INTRAVENOUS
  Filled 2023-11-11: qty 4

## 2023-11-11 NOTE — Discharge Instructions (Signed)
 Some PCP options in [place] area- not a comprehensive list  [facility]- [phone] [facility]- [phone] [facility]- [phone] [facility]- [phone] [facility]- [phone] [facility]- [phone]  or [facility] Physician Referral Line [phone]

## 2023-11-11 NOTE — TOC Progression Note (Signed)
 Transition of Care [facility]) - Progression Note    Patient Details  Name: Amanda Cruz MRN: [id number] Date of Birth: 03/17/1946  [facility] CM/SW Contact  Truddie Hidden, RN Phone Number: 11/11/2023, 4:01 PM  Clinical Narrative:    Sherron Monday with patient  regarding therapy's recommendation for [facility] PT/OT. Patient is not agreeable to [facility]. She is requesting information for PCP's as she has not been successful at contacting her MD. She thinks he may have retired. She was advised a list would be provided for her at discharge.         Expected Discharge Plan and Services                                               Social Determinants of Health (SDOH) Interventions SDOH Screenings   Food Insecurity: No Food Insecurity (11/07/2023)  Recent Concern: Food Insecurity - Food Insecurity Present (09/29/2023)   Received from [facility]  Housing: Low Risk  (11/07/2023)  Transportation Needs: No Transportation Needs (11/07/2023)  Utilities: Not At Risk (11/07/2023)  Alcohol Screen: Low Risk  (07/27/2022)  Depression (PHQ2-9): Low Risk  (07/27/2022)  Financial Resource Strain: High Risk (09/29/2023)   Received from [facility]  Physical Activity: Inactive (07/27/2022)  Social Connections: Moderately Integrated (11/07/2023)  Stress: No Stress Concern Present (07/27/2022)  Tobacco Use: Low Risk  (11/07/2023)    Readmission Risk Interventions     No data to display

## 2023-11-11 NOTE — Progress Notes (Signed)
 Physical Therapy Treatment Patient Details Name: Amanda Cruz MRN: [id number] DOB: [birth year] Today's Date: 11/11/2023   History of Present Illness Pt is a 78 yo female that presented to ED for SOB, fever, AMS. Recent diagnosis with UTI, workup for elevated troponin, non-STEMI, septic shock. PMH of HTN, venous insufficiency, HLD, GERD.    PT Comments  Pt alert, but anxious due to feelings of chest pressure. RN notified of pt status and nursing student in room at end of session to assess vitals. Pt/family educated on "broken wing" protocol s/p heart cath. Pt able to perform supine to sit with handheld assist from family at her request. Sit <> stand and step pivot from recliner CGA via handheld assist, no RW in room. Further ambulation deferred due to chest pressure. The patient would benefit from further skilled PT intervention to continue to progress towards goals.     If plan is discharge home, recommend the following: A little help with walking and/or transfers;A little help with bathing/dressing/bathroom;Assistance with cooking/housework;Help with stairs or ramp for entrance   Can travel by private vehicle        Equipment Recommendations   (pt has RW and BSC from her husband (he does not use them))    Recommendations for Other Services       Precautions / Restrictions Precautions Precautions: Fall Recall of Precautions/Restrictions: Intact Restrictions Weight Bearing Restrictions Per Provider Order: No     Mobility  Bed Mobility Overal bed mobility: Needs Assistance Bed Mobility: Supine to Sit     Supine to sit: Min assist     General bed mobility comments: pt asked for her son's help to come fully up into sitting, not fully required    Transfers Overall transfer level: Needs assistance Equipment used: 1 person hand held assist Transfers: Sit to/from Stand, Bed to chair/wheelchair/BSC Sit to Stand: Contact guard assist   Step pivot transfers: Contact guard  assist       General transfer comment: CGA no RW in room    Ambulation/Gait                   Stairs             Wheelchair Mobility     Tilt Bed    Modified Rankin (Stroke Patients Only)       Balance Overall balance assessment: Needs assistance Sitting-balance support: Feet supported Sitting balance-Leahy Scale: Good     Standing balance support: Bilateral upper extremity supported Standing balance-Leahy Scale: Fair                              Communication    Cognition Arousal: Alert Behavior During Therapy: WFL for tasks assessed/performed, Anxious   PT - Cognitive impairments: No apparent impairments                         Following commands: Intact      Cueing Cueing Techniques: Verbal cues  Exercises      General Comments        Pertinent Vitals/Pain Pain Assessment Pain Assessment: No/denies pain    Home Living                          Prior Function            PT Goals (current goals can now be found in the care plan section)  Progress towards PT goals: Progressing toward goals    Frequency    Min 1X/week      PT Plan      Co-evaluation              AM-PAC PT "6 Clicks" Mobility   Outcome Measure  Help needed turning from your back to your side while in a flat bed without using bedrails?: None Help needed moving from lying on your back to sitting on the side of a flat bed without using bedrails?: None Help needed moving to and from a bed to a chair (including a wheelchair)?: None Help needed standing up from a chair using your arms (e.g., wheelchair or bedside chair)?: A Little Help needed to walk in hospital room?: A Little Help needed climbing 3-5 steps with a railing? : A Little 6 Click Score: 21    End of Session   Activity Tolerance: Other (comment) (limited by chest pressure, RN notified) Patient left: in chair;with call bell/phone within reach;with  family/visitor present Nurse Communication: Mobility status PT Visit Diagnosis: Other abnormalities of gait and mobility (R26.89);Difficulty in walking, not elsewhere classified (R26.2);Muscle weakness (generalized) (M62.81)     Time: 1610-9604 PT Time Calculation (min) (ACUTE ONLY): 12 min  Charges:    $Therapeutic Activity: 8-22 mins PT General Charges $$ ACUTE PT VISIT: 1 Visit                     Olga Coaster PT, DPT 1:16 PM,11/11/23

## 2023-11-11 NOTE — Progress Notes (Signed)
 Progress Note    Amanda JEDLICKA  Cruz:[id number] DOB: 09/27/1945  DOA: 11/06/2023 PCP: Corky Downs, MD      Brief Narrative:    Medical records reviewed and are as summarized below:  Amanda Cruz is a 78 y.o. female with medical history significant for GERD, hypertension sigmoid polyp and chronic venous insufficiency, who presented to the emergency room with acute onset of altered mental status with associated dyspnea and fever.  She was recently diagnosed with UTI and went to urgent care on Friday.  Upon arriving to hospital, patient had significant tachycardia, tachypnea, elevated procalcitonin level, she also had a significant elevation troponin of 5416.  She developed severe hypotension, was sent to ICU for Levophed and fluid bolus.  She was treated with IV ceftriaxone for septic shock and E. coli bacteremia.  She was also treated with IV heparin drip for significant elevated troponins and suspected acute NSTEMI.  She underwent left heart catheterization which did not show any evidence of coronary artery disease.  Acute NSTEMI was ruled out.  Elevated troponins were attributed to demand ischemia.     Assessment/Plan:   Principal Problem:   Septic shock (HCC) Active Problems:   Demand ischemia (HCC)   Hypokalemia   Hypomagnesemia   Essential hypertension   Dyslipidemia   Lung nodule   Hyponatremia   E. coli septicemia (HCC)   SVT (supraventricular tachycardia) (HCC)   Body mass index is 27.1 kg/m.    Septic shock,E. coli bacteremia, acute UTI: S/p treatment with 3 doses of IV ceftriaxone.  Continue amoxicillin through 11/14/2023. Procalcitonin was 36.54.   Elevated troponins, demand ischemia: Peak troponin 6,095.  S/p left heart cath on 11/10/2023 with did not show any evidence of significant CAD but there was evidence of mildly to moderately elevated left ventricular filling pressure (18 mm) suggestive of diastolic dysfunction.  Elevated troponins likely  from demand ischemia.  Continue aspirin and Lipitor. Cardiogenic shock felt to be unlikely   Acute diastolic heart failure: Patient was given IV Lasix at the Cath Lab on 11/10/2023.  Continue IV Lasix. 2D echo on 11/07/2023 showed EF estimated at 50 to 55%, indeterminate LV diastolic parameters, moderate MR   Paroxysmal tachycardia/SVT: She converted to NSR.  Continue metoprolol.   Hypokalemia, hyponatremia and hypomagnesemia: Improved   Anemia with thrombocytopenia: Improved.  Probably from acute illness.     Comorbidities include dyslipidemia, hypertension    Diet Order             Diet Heart Room service appropriate? Yes; Fluid consistency: Thin  Diet effective now                            Consultants: Cardiologist  Procedures: Left heart cath on 11/10/2023    Medications:    amoxicillin  1,000 mg Oral Q8H   Ferrous Fumarate  1 tablet Oral Daily   And   ascorbic acid  250 mg Oral Daily   aspirin EC  81 mg Oral Daily   atorvastatin  80 mg Oral Daily   budesonide (PULMICORT) nebulizer solution  0.25 mg Nebulization BID   Chlorhexidine Gluconate Cloth  6 each Topical Daily   cholecalciferol  1,000 Units Oral Daily   enoxaparin (LOVENOX) injection  40 mg Subcutaneous Q24H   irbesartan  75 mg Oral Daily   metoprolol tartrate  50 mg Oral BID   pantoprazole (PROTONIX) IV  40 mg Intravenous Q12H  sodium chloride flush  3 mL Intravenous Q12H   Continuous Infusions:  sodium chloride       Anti-infectives (From admission, onward)    Start     Dose/Rate Route Frequency Ordered Stop   11/10/23 1800  amoxicillin (AMOXIL) capsule 1,000 mg        1,000 mg Oral Every 8 hours 11/10/23 1426 11/14/23 2159   11/07/23 2000  cefTRIAXone (ROCEPHIN) 2 g in sodium chloride 0.9 % 100 mL IVPB  Status:  Discontinued        2 g 200 mL/hr over 30 Minutes Intravenous Every 24 hours 11/07/23 1225 11/10/23 1426   11/07/23 1800  ceFEPIme (MAXIPIME) 1 g in sodium  chloride 0.9 % 100 mL IVPB  Status:  Discontinued        1 g 200 mL/hr over 30 Minutes Intravenous Every 12 hours 11/07/23 0523 11/07/23 0527   11/07/23 1200  ceFEPIme (MAXIPIME) 1 g in sodium chloride 0.9 % 100 mL IVPB  Status:  Discontinued        1 g 200 mL/hr over 30 Minutes Intravenous Every 12 hours 11/07/23 0527 11/07/23 1059   11/07/23 1200  ceFEPIme (MAXIPIME) 2 g in sodium chloride 0.9 % 100 mL IVPB  Status:  Discontinued        2 g 200 mL/hr over 30 Minutes Intravenous Every 12 hours 11/07/23 1059 11/07/23 1225   11/07/23 0530  ceFEPIme (MAXIPIME) 2 g in sodium chloride 0.9 % 100 mL IVPB  Status:  Discontinued        2 g 200 mL/hr over 30 Minutes Intravenous  Once 11/07/23 0519 11/07/23 0527   11/07/23 0530  ceFEPIme (MAXIPIME) 2 g in sodium chloride 0.9 % 100 mL IVPB  Status:  Discontinued        2 g 200 mL/hr over 30 Minutes Intravenous  Once 11/07/23 0526 11/07/23 0527   11/07/23 0215  cefTRIAXone (ROCEPHIN) 2 g in sodium chloride 0.9 % 100 mL IVPB        2 g 200 mL/hr over 30 Minutes Intravenous  Once 11/07/23 0209 11/07/23 0341              Family Communication/Anticipated D/C date and plan/Code Status   DVT prophylaxis: enoxaparin (LOVENOX) injection 40 mg Start: 11/11/23 0800     Code Status: Full Code  Family Communication: Plan discussed with Roe Coombs (son) and another son at the bedside. Disposition Plan: Plan to discharge home   Status is: Inpatient Remains inpatient appropriate because: E. coli bacteremia       Subjective:   Interval events noted.  She complains of heaviness in the chest and shortness of breath.  Her 2 sons were at the bedside.  Dorathy Daft, RN, and student nurse were also at the bedside.  Objective:    Vitals:   11/10/23 2224 11/11/23 0307 11/11/23 0900 11/11/23 1029  BP:  (!) 135/54 (!) 149/70 139/62  Pulse:  100 99 (!) 102  Resp:  18  18  Temp:  99.5 F (37.5 C) 98.5 F (36.9 C) 98.7 F (37.1 C)  TempSrc:   Oral Oral   SpO2: 97% 94% 91% 92%  Weight:      Height:       No data found.   Intake/Output Summary (Last 24 hours) at 11/11/2023 1103 Last data filed at 11/10/2023 1744 Gross per 24 hour  Intake 185 ml  Output --  Net 185 ml   Filed Weights   11/06/23 2349  Weight: 69.4 kg  Exam:   GEN: NAD SKIN: Warm and dry EYES: No pallor or icterus ENT: MMM CV: RRR PULM: No wheezing or rales. ABD: soft, ND, NT, +BS CNS: AAO x 3, non focal EXT: No edema or tenderness      Data Reviewed:   I have personally reviewed following labs and imaging studies:  Labs: Labs show the following:   Basic Metabolic Panel: Recent Labs  Lab 11/07/23 0001 11/07/23 0002 11/07/23 0639 11/07/23 0934 11/07/23 1342 11/07/23 1806 11/08/23 0445 11/09/23 0514 11/10/23 0509 11/11/23 0513  NA  --    < >  --    < > 131*  --  132* 134* 137 136  K  --    < >  --    < > 4.1 4.0 3.2* 3.7 4.2 3.9  CL  --    < >  --    < > 100  --  100 103 107 100  CO2  --    < >  --    < > 19*  --  22 22 23 25   GLUCOSE  --    < >  --    < > 131*  --  132* 134* 101* 104*  BUN  --    < >  --    < > 10  --  11 13 13 13   CREATININE  --    < >  --    < > 0.71  --  0.82 0.75 0.68 0.83  CALCIUM  --    < >  --    < > 7.6*  --  7.6* 7.8* 8.0* 8.5*  MG 1.4*  --   --   --   --  2.8* 2.6* 2.3 2.1  --   PHOS  --   --  1.8*  --   --   --  2.8 2.4* 2.3*  --    < > = values in this interval not displayed.   GFR Estimated Creatinine Clearance: 52.2 mL/min (by C-G formula based on SCr of 0.83 mg/dL). Liver Function Tests: Recent Labs  Lab 11/07/23 0002 11/07/23 0934  AST 43* 83*  ALT 29 42  ALKPHOS 62 89  BILITOT 0.7 0.5  PROT 6.1* 5.8*  ALBUMIN 3.2* 2.7*   No results for input(s): "LIPASE", "AMYLASE" in the last 168 hours. No results for input(s): "AMMONIA" in the last 168 hours. Coagulation profile Recent Labs  Lab 11/07/23 0001 11/07/23 0934  INR 1.0 1.2    CBC: Recent Labs  Lab 11/07/23 0001 11/07/23 0651  11/07/23 1145 11/08/23 0445 11/09/23 0514 11/10/23 0509  WBC 4.2 SPECIMEN CONTAMINATED, UNABLE TO PERFORM TEST(S). 11.4* 7.1 9.1 6.5  NEUTROABS 3.9 SPECIMEN CONTAMINATED, UNABLE TO PERFORM TEST(S).  --   --   --   --   HGB 11.4* SPECIMEN CONTAMINATED, UNABLE TO PERFORM TEST(S). 11.0* 10.0* 9.5* 11.3*  HCT 30.9* SPECIMEN CONTAMINATED, UNABLE TO PERFORM TEST(S). 29.9* 27.1* 26.5* 32.0*  MCV 83.7 SPECIMEN CONTAMINATED, UNABLE TO PERFORM TEST(S). 84.7 83.6 86.9 86.7  PLT 123* SPECIMEN CONTAMINATED, UNABLE TO PERFORM TEST(S). 128* 127* 162 225   Cardiac Enzymes: No results for input(s): "CKTOTAL", "CKMB", "CKMBINDEX", "TROPONINI" in the last 168 hours. BNP (last 3 results) No results for input(s): "PROBNP" in the last 8760 hours. CBG: Recent Labs  Lab 11/07/23 0842  GLUCAP 185*   D-Dimer: No results for input(s): "DDIMER" in the last 72 hours. Hgb A1c: No results for input(s): "HGBA1C" in the last 72  hours. Lipid Profile: Recent Labs    11/10/23 0509  CHOL 89  HDL 27*  LDLCALC 45  TRIG 85  CHOLHDL 3.3   Thyroid function studies: No results for input(s): "TSH", "T4TOTAL", "T3FREE", "THYROIDAB" in the last 72 hours.  Invalid input(s): "FREET3"  Anemia work up: No results for input(s): "VITAMINB12", "FOLATE", "FERRITIN", "TIBC", "IRON", "RETICCTPCT" in the last 72 hours. Sepsis Labs: Recent Labs  Lab 11/07/23 0001 11/07/23 0002 11/07/23 0651 11/07/23 0934 11/07/23 1145 11/08/23 0445 11/09/23 0514 11/10/23 0509  PROCALCITON  --  2.12  --   --   --  36.54  --   --   WBC 4.2  --  SPECIMEN CONTAMINATED, UNABLE TO PERFORM TEST(S).  --  11.4* 7.1 9.1 6.5  LATICACIDVEN 1.4  --  1.3 1.9 1.7  --   --   --     Microbiology Recent Results (from the past 240 hours)  Blood Culture (routine x 2)     Status: Abnormal   Collection Time: 11/07/23 12:03 AM   Specimen: BLOOD RIGHT ARM  Result Value Ref Range Status   Specimen Description   Final    BLOOD RIGHT ARM Performed at  [facility], [address] [place], [place] [address]    Special Requests   Final    BOTTLES DRAWN AEROBIC AND ANAEROBIC Blood Culture adequate volume Performed at [facility], [address], [place], [place] [address]    Culture  Setup Time   Final    GRAM NEGATIVE RODS IN BOTH AEROBIC AND ANAEROBIC BOTTLES CRITICAL VALUE NOTED.  VALUE IS CONSISTENT WITH PREVIOUSLY REPORTED AND CALLED VALUE. Performed at [facility], [address] [place], [place] [address]    Culture (A)  Final    ESCHERICHIA COLI SUSCEPTIBILITIES PERFORMED ON PREVIOUS CULTURE WITHIN THE LAST 5 DAYS. Performed at [facility], [address] [place], [place] [address]    Report Status 11/09/2023 FINAL  Final  Blood Culture (routine x 2)     Status: Abnormal   Collection Time: 11/07/23 12:03 AM   Specimen: BLOOD LEFT ARM  Result Value Ref Range Status   Specimen Description   Final    BLOOD LEFT ARM Performed at [facility], [address] [place], [place] [address]    Special Requests   Final    BOTTLES DRAWN AEROBIC AND ANAEROBIC Blood Culture results may not be optimal due to an inadequate volume of blood received in culture bottles Performed at [facility], [address] [place], [place] [address]    Culture  Setup Time   Final    GRAM NEGATIVE RODS IN BOTH AEROBIC AND ANAEROBIC BOTTLES CRITICAL RESULT CALLED TO, READ BACK BY AND VERIFIED WITHMila Merry PHARMD 1132 11/07/23 HNM Performed at [facility], [address] [place], [place] [address]    Culture ESCHERICHIA COLI (A)  Final   Report Status 11/09/2023 FINAL  Final   Organism ID, Bacteria ESCHERICHIA COLI  Final   Organism ID, Bacteria ESCHERICHIA COLI  Final      Susceptibility   Escherichia coli - KIRBY BAUER*    CEFAZOLIN SENSITIVE Sensitive    Escherichia coli - MIC*    AMPICILLIN <=2 SENSITIVE Sensitive     CEFEPIME <=0.12 SENSITIVE Sensitive     CEFTAZIDIME  <=1 SENSITIVE Sensitive     CEFTRIAXONE <=0.25 SENSITIVE Sensitive     CIPROFLOXACIN <=0.25 SENSITIVE Sensitive     GENTAMICIN <=1 SENSITIVE Sensitive  IMIPENEM <=0.25 SENSITIVE Sensitive     TRIMETH/SULFA <=20 SENSITIVE Sensitive     AMPICILLIN/SULBACTAM <=2 SENSITIVE Sensitive     PIP/TAZO <=4 SENSITIVE Sensitive ug/mL    * ESCHERICHIA COLI    ESCHERICHIA COLI  Blood Culture ID Panel (Reflexed)     Status: Abnormal   Collection Time: 11/07/23 12:03 AM  Result Value Ref Range Status   Enterococcus faecalis NOT DETECTED NOT DETECTED Final   Enterococcus Faecium NOT DETECTED NOT DETECTED Final   Listeria monocytogenes NOT DETECTED NOT DETECTED Final   Staphylococcus species NOT DETECTED NOT DETECTED Final   Staphylococcus aureus (BCID) NOT DETECTED NOT DETECTED Final   Staphylococcus epidermidis NOT DETECTED NOT DETECTED Final   Staphylococcus lugdunensis NOT DETECTED NOT DETECTED Final   Streptococcus species NOT DETECTED NOT DETECTED Final   Streptococcus agalactiae NOT DETECTED NOT DETECTED Final   Streptococcus pneumoniae NOT DETECTED NOT DETECTED Final   Streptococcus pyogenes NOT DETECTED NOT DETECTED Final   A.calcoaceticus-baumannii NOT DETECTED NOT DETECTED Final   Bacteroides fragilis NOT DETECTED NOT DETECTED Final   Enterobacterales DETECTED (A) NOT DETECTED Final    Comment: Enterobacterales represent a large order of gram negative bacteria, not a single organism. CRITICAL RESULT CALLED TO, READ BACK BY AND VERIFIED WITH: Mila Merry PHARMD 1132 11/07/23 HNM    Enterobacter cloacae complex NOT DETECTED NOT DETECTED Final   Escherichia coli DETECTED (A) NOT DETECTED Final    Comment: CRITICAL RESULT CALLED TO, READ BACK BY AND VERIFIED WITH: Mila Merry PHARMD 1132 11/07/23 HNM    Klebsiella aerogenes NOT DETECTED NOT DETECTED Final   Klebsiella oxytoca NOT DETECTED NOT DETECTED Final   Klebsiella pneumoniae NOT DETECTED NOT DETECTED Final   Proteus species NOT  DETECTED NOT DETECTED Final   Salmonella species NOT DETECTED NOT DETECTED Final   Serratia marcescens NOT DETECTED NOT DETECTED Final   Haemophilus influenzae NOT DETECTED NOT DETECTED Final   Neisseria meningitidis NOT DETECTED NOT DETECTED Final   Pseudomonas aeruginosa NOT DETECTED NOT DETECTED Final   Stenotrophomonas maltophilia NOT DETECTED NOT DETECTED Final   Candida albicans NOT DETECTED NOT DETECTED Final   Candida auris NOT DETECTED NOT DETECTED Final   Candida glabrata NOT DETECTED NOT DETECTED Final   Candida krusei NOT DETECTED NOT DETECTED Final   Candida parapsilosis NOT DETECTED NOT DETECTED Final   Candida tropicalis NOT DETECTED NOT DETECTED Final   Cryptococcus neoformans/gattii NOT DETECTED NOT DETECTED Final   CTX-M ESBL NOT DETECTED NOT DETECTED Final   Carbapenem resistance IMP NOT DETECTED NOT DETECTED Final   Carbapenem resistance KPC NOT DETECTED NOT DETECTED Final   Carbapenem resistance NDM NOT DETECTED NOT DETECTED Final   Carbapenem resist OXA 48 LIKE NOT DETECTED NOT DETECTED Final   Carbapenem resistance VIM NOT DETECTED NOT DETECTED Final    Comment: Performed at [facility], [address] [place], [place] [address]  Resp panel by RT-PCR (RSV, Flu A&B, Covid) Anterior Nasal Swab     Status: None   Collection Time: 11/07/23  6:39 AM   Specimen: Anterior Nasal Swab  Result Value Ref Range Status   SARS Coronavirus 2 by RT PCR NEGATIVE NEGATIVE Final    Comment: (NOTE) SARS-CoV-2 target nucleic acids are NOT DETECTED.  The SARS-CoV-2 RNA is generally detectable in upper respiratory specimens during the acute phase of infection. The lowest concentration of SARS-CoV-2 viral copies this assay can detect is 138 copies/mL. A negative result does not preclude SARS-Cov-2 infection and should not be used  as the sole basis for treatment or other patient management decisions. A negative result may occur with  improper specimen collection/handling,  submission of specimen other than nasopharyngeal swab, presence of viral mutation(s) within the areas targeted by this assay, and inadequate number of viral copies(<138 copies/mL). A negative result must be combined with clinical observations, patient history, and epidemiological information. The expected result is Negative.  Fact Sheet for Patients:  BloggerCourse.com  Fact Sheet for Healthcare Providers:  SeriousBroker.it  This test is no t yet approved or cleared by the [place] FDA and  has been authorized for detection and/or diagnosis of SARS-CoV-2 by FDA under an Emergency Use Authorization (EUA). This EUA will remain  in effect (meaning this test can be used) for the duration of the COVID-19 declaration under Section 564(b)(1) of the Act, 21 U.S.C.section 360bbb-3(b)(1), unless the authorization is terminated  or revoked sooner.       Influenza A by PCR NEGATIVE NEGATIVE Final   Influenza B by PCR NEGATIVE NEGATIVE Final    Comment: (NOTE) The Xpert Xpress SARS-CoV-2/FLU/RSV plus assay is intended as an aid in the diagnosis of influenza from Nasopharyngeal swab specimens and should not be used as a sole basis for treatment. Nasal washings and aspirates are unacceptable for Xpert Xpress SARS-CoV-2/FLU/RSV testing.  Fact Sheet for Patients: BloggerCourse.com  Fact Sheet for Healthcare Providers: SeriousBroker.it  This test is not yet approved or cleared by the [place] FDA and has been authorized for detection and/or diagnosis of SARS-CoV-2 by FDA under an Emergency Use Authorization (EUA). This EUA will remain in effect (meaning this test can be used) for the duration of the COVID-19 declaration under Section 564(b)(1) of the Act, 21 U.S.C. section 360bbb-3(b)(1), unless the authorization is terminated or revoked.     Resp Syncytial Virus by PCR NEGATIVE  NEGATIVE Final    Comment: (NOTE) Fact Sheet for Patients: BloggerCourse.com  Fact Sheet for Healthcare Providers: SeriousBroker.it  This test is not yet approved or cleared by the [place] FDA and has been authorized for detection and/or diagnosis of SARS-CoV-2 by FDA under an Emergency Use Authorization (EUA). This EUA will remain in effect (meaning this test can be used) for the duration of the COVID-19 declaration under Section 564(b)(1) of the Act, 21 U.S.C. section 360bbb-3(b)(1), unless the authorization is terminated or revoked.  Performed at [facility], [address] [place], [place] [address]   Respiratory (~20 pathogens) panel by PCR     Status: None   Collection Time: 11/07/23  9:20 AM   Specimen: Nasopharyngeal Swab; Respiratory  Result Value Ref Range Status   Adenovirus NOT DETECTED NOT DETECTED Final   Coronavirus 229E NOT DETECTED NOT DETECTED Final    Comment: (NOTE) The Coronavirus on the Respiratory Panel, DOES NOT test for the novel  Coronavirus (2019 nCoV)    Coronavirus HKU1 NOT DETECTED NOT DETECTED Final   Coronavirus NL63 NOT DETECTED NOT DETECTED Final   Coronavirus OC43 NOT DETECTED NOT DETECTED Final   Metapneumovirus NOT DETECTED NOT DETECTED Final   Rhinovirus / Enterovirus NOT DETECTED NOT DETECTED Final   Influenza A NOT DETECTED NOT DETECTED Final   Influenza B NOT DETECTED NOT DETECTED Final   Parainfluenza Virus 1 NOT DETECTED NOT DETECTED Final   Parainfluenza Virus 2 NOT DETECTED NOT DETECTED Final   Parainfluenza Virus 3 NOT DETECTED NOT DETECTED Final   Parainfluenza Virus 4 NOT DETECTED NOT DETECTED Final   Respiratory Syncytial Virus NOT DETECTED NOT DETECTED Final  Bordetella pertussis NOT DETECTED NOT DETECTED Final   Bordetella Parapertussis NOT DETECTED NOT DETECTED Final   Chlamydophila pneumoniae NOT DETECTED NOT DETECTED Final   Mycoplasma pneumoniae NOT  DETECTED NOT DETECTED Final    Comment: Performed at [facility], [address], [place], [place] [address]  Culture, blood (x 2)     Status: None (Preliminary result)   Collection Time: 11/07/23  9:34 AM   Specimen: BLOOD  Result Value Ref Range Status   Specimen Description BLOOD RIGHT ANTECUBITAL  Final   Special Requests   Final    BOTTLES DRAWN AEROBIC ONLY Blood Culture adequate volume   Culture   Final    NO GROWTH 4 DAYS Performed at [facility], [address], [place], [place] [address]    Report Status PENDING  Incomplete  Culture, blood (x 2)     Status: None (Preliminary result)   Collection Time: 11/07/23  9:34 AM   Specimen: BLOOD  Result Value Ref Range Status   Specimen Description BLOOD BLOOD RIGHT ARM  Final   Special Requests   Final    BOTTLES DRAWN AEROBIC AND ANAEROBIC Blood Culture results may not be optimal due to an inadequate volume of blood received in culture bottles   Culture   Final    NO GROWTH 4 DAYS Performed at [facility], [address], [place], [place] [address]    Report Status PENDING  Incomplete  MRSA Next Gen by PCR, Nasal     Status: None   Collection Time: 11/07/23 12:35 PM   Specimen: Nasal Mucosa; Nasal Swab  Result Value Ref Range Status   MRSA by PCR Next Gen NOT DETECTED NOT DETECTED Final    Comment: (NOTE) The GeneXpert MRSA Assay (FDA approved for NASAL specimens only), is one component of a comprehensive MRSA colonization surveillance program. It is not intended to diagnose MRSA infection nor to guide or monitor treatment for MRSA infections. Test performance is not FDA approved in patients less than 64 years old. Performed at [facility], [address] [place], [place] [address]   C Difficile Quick Screen w PCR reflex     Status: None   Collection Time: 11/07/23  4:11 PM   Specimen: STOOL  Result Value Ref Range Status   C Diff antigen NEGATIVE NEGATIVE Final   C Diff toxin  NEGATIVE NEGATIVE Final   C Diff interpretation No C. difficile detected.  Final    Comment: Performed at [facility], [address] [place], [place] [address]    Procedures and diagnostic studies:  CARDIAC CATHETERIZATION Result Date: 11/10/2023 Conclusions: No angiographically significant coronary artery disease.  I suspect elevated troponin on admission was due to supply-demand mismatch in the setting of septic shock from E. coli bacteremia. Mildly to moderately elevated left ventricular filling pressure (18 mmHg, 30 mmHg post a-wave) suggestive of diastolic dysfunction. Recommendations: Gentle diuresis. Primary prevention of coronary artery disease. Ongoing treatment of E. coli bacteremia per primary team. Yvonne Kendall, MD [facility]              LOS: 4 days   Damon Baisch  [facility]   Pager on [email or web address]. If 7PM-7AM, please contact night-coverage at [email or web address]     11/11/2023, 11:03 AM

## 2023-11-11 NOTE — Consult Note (Signed)
 PHARMACY CONSULT NOTE - FOLLOW UP  Pharmacy Consult for Electrolyte Monitoring and Replacement   Recent Labs: Potassium (mmol/L)  Date Value  11/11/2023 3.9   Magnesium (mg/dL)  Date Value  52/84/1324 2.1   Calcium (mg/dL)  Date Value  40/05/2724 8.5 (L)   Albumin (g/dL)  Date Value  36/64/4034 2.7 (L)   Phosphorus (mg/dL)  Date Value  74/25/9563 2.3 (L)   Sodium (mmol/L)  Date Value  11/11/2023 136   Assessment: EA is a 78 yo female who presented to the ED 3/9 with altered mental status and shortness of breath with fever and septic shock. Paroxysmal tach/SVT and NSTEMI in setting of septic shock. Pharmacy consulted to optimize electrolytes per cards recommendation.  Goal of Therapy:  Potassium >4, Magnesium >2 All Other Electrolytes WNL  Plan:  K 3.9, will replace with Kcl po 40mq x 1 to optimize. No additional replacement needed. BMP with AM labs  Thank you for allowing pharmacy to participate in this patient's care.   Tzvi Economou Rodriguez-Guzman PharmD, BCPS 11/11/2023 8:28 AM

## 2023-11-11 NOTE — Plan of Care (Signed)
  Problem: Education: Goal: Understanding of cardiac disease, CV risk reduction, and recovery process will improve Outcome: Progressing Goal: Individualized Educational Video(s) Outcome: Progressing   Problem: Activity: Goal: Ability to tolerate increased activity will improve Outcome: Progressing   Problem: Cardiac: Goal: Ability to achieve and maintain adequate cardiovascular perfusion will improve Outcome: Progressing   Problem: Health Behavior/Discharge Planning: Goal: Ability to safely manage health-related needs after discharge will improve Outcome: Progressing   Problem: Fluid Volume: Goal: Hemodynamic stability will improve Outcome: Progressing

## 2023-11-11 NOTE — Progress Notes (Signed)
 Progress Note  Patient Name: Amanda Cruz Date of Encounter: 11/11/2023  Primary Cardiologist: New - consult by Kirke Corin  Subjective   LHC on 3/13 showed no angiographically significant CAD with mildly to moderately elevated LVEDP suggestive of diastolic dysfunction. In this setting she was given IV Lasix 40 mg x 1 post cath. No documented UOP. No weights. Renal function stable. BP mildly elevated this morning.   Reports chest heaviness that began after taking amoxicillin this morning. Also notes dyspnea. No lower extremity swelling. Reports good UOP with IV Lasix. No right radial arteriotomy site complications. Continues to feel weak. Sons at bedside.   Inpatient Medications    Scheduled Meds:  amoxicillin  1,000 mg Oral Q8H   Ferrous Fumarate  1 tablet Oral Daily   And   ascorbic acid  250 mg Oral Daily   aspirin EC  81 mg Oral Daily   atorvastatin  80 mg Oral Daily   budesonide (PULMICORT) nebulizer solution  0.25 mg Nebulization BID   Chlorhexidine Gluconate Cloth  6 each Topical Daily   cholecalciferol  1,000 Units Oral Daily   enoxaparin (LOVENOX) injection  40 mg Subcutaneous Q24H   furosemide  40 mg Intravenous Once   irbesartan  75 mg Oral Daily   metoprolol tartrate  50 mg Oral BID   pantoprazole (PROTONIX) IV  40 mg Intravenous Q12H   potassium chloride  40 mEq Oral Once   sodium chloride flush  3 mL Intravenous Q12H   Continuous Infusions:  sodium chloride     PRN Meds: sodium chloride, acetaminophen, levalbuterol, magnesium hydroxide, nitroGLYCERIN, ondansetron (ZOFRAN) IV, mouth rinse, polyvinyl alcohol, sodium chloride flush, traZODone   Vital Signs    Vitals:   11/10/23 1942 11/10/23 2224 11/11/23 0307 11/11/23 0900  BP:   (!) 135/54 (!) 149/70  Pulse: 98  100 99  Resp:   18   Temp:   99.5 F (37.5 C) 98.5 F (36.9 C)  TempSrc:    Oral  SpO2:  97% 94% 91%  Weight:      Height:        Intake/Output Summary (Last 24 hours) at 11/11/2023  1011 Last data filed at 11/10/2023 1744 Gross per 24 hour  Intake 185 ml  Output --  Net 185 ml   Filed Weights   11/06/23 2349  Weight: 69.4 kg    Telemetry    SR, 80s to low 100s bpm, rare PAC, short run of SVT at 17:06 lasting 21 seconds - Personally Reviewed  ECG    No new tracings - Personally Reviewed  Physical Exam   GEN: No acute distress.   Neck: No JVD. Cardiac: RRR, no murmurs, rubs, or gallops. Right radial arteriotomy site with clean, dry, and intact dressing. No active bleeding, swelling, warmth, erythema, bruising, or TTP. Radial pulse 2+ proximal and distal to the arteriotomy site.  Respiratory: Clear to auscultation bilaterally.  GI: Soft, nontender, non-distended.   MS: No edema; No deformity. Neuro:  Alert and oriented x 3; Nonfocal.  Psych: Normal affect.  Labs    Chemistry Recent Labs  Lab 11/07/23 0002 11/07/23 0934 11/07/23 1342 11/09/23 0514 11/10/23 0509 11/11/23 0513  NA 125* 130*   < > 134* 137 136  K 2.6* 3.3*   < > 3.7 4.2 3.9  CL 92* 96*   < > 103 107 100  CO2 22 22   < > 22 23 25   GLUCOSE 153* 196*   < > 134* 101* 104*  BUN 12 11   < > 13 13 13   CREATININE 0.88 0.87   < > 0.75 0.68 0.83  CALCIUM 8.1* 7.5*   < > 7.8* 8.0* 8.5*  PROT 6.1* 5.8*  --   --   --   --   ALBUMIN 3.2* 2.7*  --   --   --   --   AST 43* 83*  --   --   --   --   ALT 29 42  --   --   --   --   ALKPHOS 62 89  --   --   --   --   BILITOT 0.7 0.5  --   --   --   --   GFRNONAA >60 >60   < > >60 >60 >60  ANIONGAP 11 12   < > 9 7 11    < > = values in this interval not displayed.     Hematology Recent Labs  Lab 11/08/23 0445 11/09/23 0514 11/10/23 0509  WBC 7.1 9.1 6.5  RBC 3.24* 3.05* 3.69*  HGB 10.0* 9.5* 11.3*  HCT 27.1* 26.5* 32.0*  MCV 83.6 86.9 86.7  MCH 30.9 31.1 30.6  MCHC 36.9* 35.8 35.3  RDW 11.9 12.2 12.5  PLT 127* 162 225    Cardiac EnzymesNo results for input(s): "TROPONINI" in the last 168 hours. No results for input(s): "TROPIPOC"  in the last 168 hours.   BNP Recent Labs  Lab 11/07/23 0001  BNP 236.1*     DDimer No results for input(s): "DDIMER" in the last 168 hours.   Radiology     Cardiac Studies   2D echo 11/07/2023: 1. Left ventricular ejection fraction, by estimation, is 50 to 55%. The  left ventricle has low normal function. The left ventricle has no regional  wall motion abnormalities. There is moderate left ventricular hypertrophy.  Left ventricular diastolic  parameters are indeterminate.   2. Right ventricular systolic function is normal. The right ventricular  size is normal. There is normal pulmonary artery systolic pressure.   3. The mitral valve is normal in structure. Moderate mitral valve  regurgitation. No evidence of mitral stenosis.   4. The aortic valve is normal in structure. Aortic valve regurgitation is  not visualized. No aortic stenosis is present.   5. The inferior vena cava is normal in size with greater than 50%  respiratory variability, suggesting right atrial pressure of 3 mmHg.  __________  LHC 11/10/2023: Conclusions: No angiographically significant coronary artery disease.  I suspect elevated troponin on admission was due to supply-demand mismatch in the setting of septic shock from E. coli bacteremia. Mildly to moderately elevated left ventricular filling pressure (18 mmHg, 30 mmHg post a-wave) suggestive of diastolic dysfunction.   Recommendations: Gentle diuresis. Primary prevention of coronary artery disease. Ongoing treatment of E. coli bacteremia per primary team.  Patient Profile     78 y.o. female with history of HTN, HLD, chronic venous insufficiency, and GERD admitted with septic shock secondary to E coli bacteremia with admission complicated by NSTEMI, PSVT, and electrolyte derangements who we are seeing for NSTEMI and PSVT.  Assessment & Plan    1.  Demand ischemia: -Felt to be in the setting of septic shock with E. coli bacteremia with troponin up to  6095 with subsequent downtrend -Currently without symptoms of angina or cardiac decompensation -Echo this admission with low normal LV systolic function with normal wall motion -LHC on 3/13 showed no angiographically significant CAD -  Aspirin 81 mg, atorvastatin 80 mg, metoprolol 50 mg twice daily -Post cath instructions  2.  PSVT: -Burden significantly improved following transition from albuterol to Xopenex -Single 21-second run occurring in the evening of 3/13 -Continue titrated dose of Lopressor 50 mg twice daily -Maintain potassium and magnesium at goal 4.0 and 2.0 respectively -TSH normal -Echo with low normal LV systolic function -For now, defer initiation of AAD -Monitor on telemetry  3. Diastolic dysfunction: -LVEDP mildly to moderately elevated during LHC on 3/13, given a dose of IV Lasix 40 mg x 1 with patient reported good UOP -Ins/outs and weights no reported -With reports dyspnea, will give another dose of IV Lasix 40 mg x 1 today  4.  HTN: -Blood pressure mildly elevated this morning  -Transition PTA valsartan to irbesartan 75 mg -Hold PTA hydrochlorothiazide with IV diuresis   5.  HLD: -LDL 45  -On atorvastatin 80 mg    For questions or updates, please contact [facility] Please consult [email or web address] for contact info under Cardiology/STEMI.    Signed, Eula Listen, PA-C [facility] Pager: [phone] 11/11/2023, 10:11 AM

## 2023-11-12 DIAGNOSIS — I2489 Other forms of acute ischemic heart disease: Secondary | ICD-10-CM

## 2023-11-12 DIAGNOSIS — R6521 Severe sepsis with septic shock: Secondary | ICD-10-CM | POA: Diagnosis not present

## 2023-11-12 DIAGNOSIS — A419 Sepsis, unspecified organism: Secondary | ICD-10-CM | POA: Diagnosis not present

## 2023-11-12 LAB — BASIC METABOLIC PANEL
Anion gap: 8 (ref 5–15)
BUN: 14 mg/dL (ref 8–23)
CO2: 26 mmol/L (ref 22–32)
Calcium: 8.4 mg/dL — ABNORMAL LOW (ref 8.9–10.3)
Chloride: 101 mmol/L (ref 98–111)
Creatinine, Ser: 0.82 mg/dL (ref 0.44–1.00)
GFR, Estimated: >60 mL/min (ref >60)
Glucose, Bld: 108 mg/dL — ABNORMAL HIGH (ref 70–99)
Potassium: 4.1 mmol/L (ref 3.5–5.1)
Sodium: 135 mmol/L (ref 135–145)

## 2023-11-12 LAB — CULTURE, BLOOD (ROUTINE X 2)
Culture: NO GROWTH
Culture: NO GROWTH
Special Requests: ADEQUATE

## 2023-11-12 MED ORDER — POTASSIUM CHLORIDE CRYS ER 10 MEQ PO TBCR
10.0000 meq | EXTENDED_RELEASE_TABLET | Freq: Every day | ORAL | Status: DC
Start: 2023-11-12 — End: 2023-11-12
  Administered 2023-11-12: 10 meq via ORAL
  Filled 2023-11-12: qty 1

## 2023-11-12 MED ORDER — ASPIRIN 81 MG PO TBEC: 81.0000 mg | DELAYED_RELEASE_TABLET | Freq: Every day | ORAL | Status: DC

## 2023-11-12 MED ORDER — AMOXICILLIN 500 MG PO CAPS: 1000.0000 mg | ORAL_CAPSULE | Freq: Three times a day (TID) | ORAL | 0 refills | Status: AC

## 2023-11-12 MED ORDER — HYDROCHLOROTHIAZIDE 12.5 MG PO TABS
12.5000 mg | ORAL_TABLET | Freq: Every day | ORAL | Status: DC
Start: 2023-11-12 — End: 2023-11-12
  Administered 2023-11-12: 12.5 mg via ORAL
  Filled 2023-11-12: qty 1

## 2023-11-12 MED ORDER — ATORVASTATIN CALCIUM 20 MG PO TABS: 40.0000 mg | ORAL_TABLET | Freq: Every day | ORAL | Status: DC

## 2023-11-12 MED ORDER — ATORVASTATIN CALCIUM 20 MG PO TABS
20.0000 mg | ORAL_TABLET | Freq: Every day | ORAL | Status: DC
Start: 2023-11-12 — End: 2023-11-12
  Administered 2023-11-12: 20 mg via ORAL
  Filled 2023-11-12: qty 1

## 2023-11-12 MED ORDER — METOPROLOL TARTRATE 50 MG PO TABS: 50.0000 mg | ORAL_TABLET | Freq: Two times a day (BID) | ORAL | 0 refills | Status: DC

## 2023-11-12 MED ORDER — K PHOS MONO-SOD PHOS DI & MONO 155-852-130 MG PO TABS: 500.0000 mg | ORAL_TABLET | ORAL | Status: DC

## 2023-11-12 NOTE — TOC Transition Note (Signed)
 Transition of Care [facility]) - Discharge Note   Patient Details  Name: Amanda Cruz MRN: [id number] Date of Birth: 12/16/1945  [facility] CM/SW Contact:  Liliana Cline, LCSW Phone Number: 11/12/2023, 2:39 PM   Clinical Narrative:    CSW spoke with patient who has decided she does want home health.  Confirmed home address. Explained agency options. Referral accepted by [facility] with [facility]. Patient states her son will drive her home today and denies any additional TOC needs.   Final next level of care: Home w Home Health Services Barriers to Discharge: Barriers Resolved   Patient Goals and CMS Choice   CMS Medicare.gov Compare Post Acute Care list provided to:: Patient Choice offered to / list presented to : Patient      Discharge Placement                Patient to be transferred to facility by: son Name of family member notified: Don Patient and family notified of of transfer: 11/12/23  Discharge Plan and Services Additional resources added to the After Visit Summary for                            [facility] Arranged: PT, OT [facility] Agency: [facility] Date [facility] Agency Contacted: 11/12/23   Representative spoke with at [facility] Agency: Kandee Keen  Social Drivers of Health (SDOH) Interventions SDOH Screenings   Food Insecurity: No Food Insecurity (11/07/2023)  Recent Concern: Food Insecurity - Food Insecurity Present (09/29/2023)   Received from [facility]  Housing: Low Risk  (11/07/2023)  Transportation Needs: No Transportation Needs (11/07/2023)  Utilities: Not At Risk (11/07/2023)  Alcohol Screen: Low Risk  (07/27/2022)  Depression (PHQ2-9): Low Risk  (07/27/2022)  Financial Resource Strain: High Risk (09/29/2023)   Received from [facility]  Physical Activity: Inactive (07/27/2022)  Social Connections: Moderately Integrated (11/07/2023)  Stress: No Stress Concern Present (07/27/2022)  Tobacco Use: Low Risk  (11/07/2023)      Readmission Risk Interventions     No data to display

## 2023-11-12 NOTE — Consult Note (Signed)
 PHARMACY CONSULT NOTE - FOLLOW UP  Pharmacy Consult for Electrolyte Monitoring and Replacement   Recent Labs: Potassium (mmol/L)  Date Value  11/12/2023 4.1   Magnesium (mg/dL)  Date Value  86/57/8469 2.1   Calcium (mg/dL)  Date Value  62/95/2841 8.4 (L)   Albumin (g/dL)  Date Value  32/44/0102 2.7 (L)   Phosphorus (mg/dL)  Date Value  72/53/6644 2.3 (L)   Sodium (mmol/L)  Date Value  11/12/2023 135   Assessment: EA is a 78 yo female who presented to the ED 3/9 with altered mental status and shortness of breath with fever and septic shock. Paroxysmal tach/SVT and NSTEMI in setting of septic shock. Pharmacy consulted to optimize electrolytes per cards recommendation.  Goal of Therapy:  Potassium >4, Magnesium >2 All Other Electrolytes WNL  Plan:  No replacement currently indicated BMP, Mg, Phos with AM labs  Thank you for allowing pharmacy to participate in this patient's care.   Bettey Costa, PharmD Clinical Pharmacist 11/12/2023 8:17 AM

## 2023-11-12 NOTE — Plan of Care (Signed)

## 2023-11-12 NOTE — Progress Notes (Signed)
 This RN provided discharge instructions and teaching to the patient and the patient's son. Both parties verbalized and demonstrated understanding of the provided instructions. All outstanding questions. All PIVs removed. All cannulas intact. Pt tolerated well. All belongings packed and in tow.

## 2023-11-12 NOTE — Progress Notes (Signed)
 Progress Note  Patient Name: Amanda Cruz Date of Encounter: 11/12/2023  Primary Cardiologist: New - consult by Kirke Corin  Subjective   Given another dose of IV Lasix on 3/14, documented UOP 580 mL for the past 24 hours, net + 1.7 L for the admission. No weights. Renal function stable. No further chest heaviness. Breathing back to baseline. No orthopnea, palpitations, dizziness, presyncope, or syncope.   Inpatient Medications    Scheduled Meds:  amoxicillin  1,000 mg Oral Q8H   Ferrous Fumarate  1 tablet Oral Daily   And   ascorbic acid  250 mg Oral Daily   aspirin EC  81 mg Oral Daily   atorvastatin  80 mg Oral Daily   budesonide (PULMICORT) nebulizer solution  0.25 mg Nebulization BID   Chlorhexidine Gluconate Cloth  6 each Topical Daily   cholecalciferol  1,000 Units Oral Daily   enoxaparin (LOVENOX) injection  40 mg Subcutaneous Q24H   irbesartan  75 mg Oral Daily   metoprolol tartrate  50 mg Oral BID   pantoprazole (PROTONIX) IV  40 mg Intravenous Q12H   sodium chloride flush  3 mL Intravenous Q12H   Continuous Infusions:   PRN Meds: acetaminophen, levalbuterol, magnesium hydroxide, nitroGLYCERIN, ondansetron (ZOFRAN) IV, mouth rinse, polyvinyl alcohol, sodium chloride flush, traZODone   Vital Signs    Vitals:   11/11/23 2000 11/11/23 2022 11/12/23 0000 11/12/23 0400  BP: (!) 150/65  (!) 179/83 (!) 123/44  Pulse: 96  98 (!) 107  Resp: 20  20 20   Temp: 97.7 F (36.5 C)  98.8 F (37.1 C) 99.8 F (37.7 C)  TempSrc: Oral  Oral Oral  SpO2: 97% 94% 96% 96%  Weight:      Height:        Intake/Output Summary (Last 24 hours) at 11/12/2023 0747 Last data filed at 11/11/2023 1953 Gross per 24 hour  Intake 120 ml  Output 700 ml  Net -580 ml   Filed Weights   11/06/23 2349  Weight: 69.4 kg    Telemetry    SR, 80s to low 120s bpm, no further SVT over the past 24 hours - Personally Reviewed  ECG    No new tracings - Personally Reviewed  Physical Exam    GEN: No acute distress.   Neck: No JVD. Cardiac: RRR, no murmurs, rubs, or gallops. Right radial arteriotomy site with clean, dry, and intact dressing. No active bleeding, swelling, warmth, erythema, bruising, or TTP. Radial pulse 2+ proximal and distal to the arteriotomy site.  Respiratory: Clear to auscultation bilaterally.  GI: Soft, nontender, non-distended.   MS: No edema; No deformity. Neuro:  Alert and oriented x 3; Nonfocal.  Psych: Normal affect.  Labs    Chemistry Recent Labs  Lab 11/07/23 0002 11/07/23 0934 11/07/23 1342 11/10/23 0509 11/11/23 0513 11/12/23 0259  NA 125* 130*   < > 137 136 135  K 2.6* 3.3*   < > 4.2 3.9 4.1  CL 92* 96*   < > 107 100 101  CO2 22 22   < > 23 25 26   GLUCOSE 153* 196*   < > 101* 104* 108*  BUN 12 11   < > 13 13 14   CREATININE 0.88 0.87   < > 0.68 0.83 0.82  CALCIUM 8.1* 7.5*   < > 8.0* 8.5* 8.4*  PROT 6.1* 5.8*  --   --   --   --   ALBUMIN 3.2* 2.7*  --   --   --   --  AST 43* 83*  --   --   --   --   ALT 29 42  --   --   --   --   ALKPHOS 62 89  --   --   --   --   BILITOT 0.7 0.5  --   --   --   --   GFRNONAA >60 >60   < > >60 >60 >60  ANIONGAP 11 12   < > 7 11 8    < > = values in this interval not displayed.     Hematology Recent Labs  Lab 11/08/23 0445 11/09/23 0514 11/10/23 0509  WBC 7.1 9.1 6.5  RBC 3.24* 3.05* 3.69*  HGB 10.0* 9.5* 11.3*  HCT 27.1* 26.5* 32.0*  MCV 83.6 86.9 86.7  MCH 30.9 31.1 30.6  MCHC 36.9* 35.8 35.3  RDW 11.9 12.2 12.5  PLT 127* 162 225    Cardiac EnzymesNo results for input(s): "TROPONINI" in the last 168 hours. No results for input(s): "TROPIPOC" in the last 168 hours.   BNP Recent Labs  Lab 11/07/23 0001  BNP 236.1*     DDimer No results for input(s): "DDIMER" in the last 168 hours.   Radiology     Cardiac Studies   2D echo 11/07/2023: 1. Left ventricular ejection fraction, by estimation, is 50 to 55%. The  left ventricle has low normal function. The left ventricle  has no regional  wall motion abnormalities. There is moderate left ventricular hypertrophy.  Left ventricular diastolic  parameters are indeterminate.   2. Right ventricular systolic function is normal. The right ventricular  size is normal. There is normal pulmonary artery systolic pressure.   3. The mitral valve is normal in structure. Moderate mitral valve  regurgitation. No evidence of mitral stenosis.   4. The aortic valve is normal in structure. Aortic valve regurgitation is  not visualized. No aortic stenosis is present.   5. The inferior vena cava is normal in size with greater than 50%  respiratory variability, suggesting right atrial pressure of 3 mmHg.  __________  LHC 11/10/2023: Conclusions: No angiographically significant coronary artery disease.  I suspect elevated troponin on admission was due to supply-demand mismatch in the setting of septic shock from E. coli bacteremia. Mildly to moderately elevated left ventricular filling pressure (18 mmHg, 30 mmHg post a-wave) suggestive of diastolic dysfunction.   Recommendations: Gentle diuresis. Primary prevention of coronary artery disease. Ongoing treatment of E. coli bacteremia per primary team.  Patient Profile     78 y.o. female with history of HTN, HLD, chronic venous insufficiency, and GERD admitted with septic shock secondary to E coli bacteremia with admission complicated by NSTEMI, PSVT, and electrolyte derangements who we are seeing for NSTEMI and PSVT.  Assessment & Plan    1.  Demand ischemia: -Felt to be in the setting of septic shock with E. coli bacteremia with troponin up to 6095 with subsequent downtrend -Currently without symptoms of angina or cardiac decompensation -Echo this admission with low normal LV systolic function with normal wall motion -LHC on 3/13 showed no angiographically significant CAD -Aspirin 81 mg, atorvastatin 80 mg, metoprolol 50 mg twice daily -Post cath instructions  2.   PSVT: -Burden significantly improved following transition from albuterol to Xopenex -Continue titrated dose of Lopressor 50 mg twice daily -Maintain potassium and magnesium at goal 4.0 and 2.0 respectively -TSH normal -Echo with low normal LV systolic function -For now, defer initiation of AAD -Monitor on telemetry  3. Diastolic dysfunction: -LVEDP mildly to moderately elevated during LHC on 3/13, given a dose of IV Lasix 40 mg x on 3/13 and 3/14 with patient reported good UOP and improvement in symptoms -I do not think she require further diuresis at this time -Ins/outs incomplete, no weights  4.  HTN: -Blood pressure overall improving -Has been transitioned from PTA valsartan to irbesartan 75 mg -Resume PTA hydrochlorothiazide 12.5 mg now that we are not actively diuresing  -Remains off PTA amlodipine 2.5 mg at this point  5.  HLD: -LDL 45  -On atorvastatin 80 mg    For questions or updates, please contact [facility] Please consult [email or web address] for contact info under Cardiology/STEMI.    Signed, Eula Listen, PA-C [facility] Pager: [phone] 11/12/2023, 7:47 AM

## 2023-11-12 NOTE — Discharge Summary (Signed)
 Physician Discharge Summary   Patient: Amanda Cruz MRN: [id number] DOB: 27-Dec-1945  Admit date:     11/06/2023  Discharge date: 11/12/23  Discharge Physician: Lurene Shadow   PCP: Corky Downs, MD   Recommendations at discharge:   Follow-up with PCP in 1 to 2 weeks  Discharge Diagnoses: Principal Problem:   Septic shock (HCC) Active Problems:   Demand ischemia (HCC)   Hypokalemia   Hypomagnesemia   Essential hypertension   Dyslipidemia   Lung nodule   Hyponatremia   E. coli septicemia (HCC)   SVT (supraventricular tachycardia) (HCC)   NSTEMI (non-ST elevated myocardial infarction) (HCC)  Resolved Problems:   * No resolved hospital problems. *  Hospital Course:  Amanda Cruz is a 78 y.o. female with medical history significant for GERD, hypertension sigmoid polyp and chronic venous insufficiency, who presented to the emergency room with acute onset of altered mental status with associated dyspnea and fever.  She was recently diagnosed with UTI and went to urgent care on Friday.  Upon arriving to hospital, patient had significant tachycardia, tachypnea, elevated procalcitonin level, she also had a significant elevation troponin of 5416.  She developed severe hypotension, was sent to ICU for Levophed and fluid bolus.  She was treated with IV ceftriaxone for septic shock and E. coli bacteremia.  She was also treated with IV heparin drip for significant elevated troponins and suspected acute NSTEMI.   She underwent left heart catheterization which did not show any evidence of coronary artery disease.  Acute NSTEMI was ruled out.  Elevated troponins were attributed to demand ischemia.      Assessment and Plan:  Septic shock,E. coli bacteremia, acute UTI: S/p treatment with 3 doses of IV ceftriaxone.  Continue amoxicillin through 11/14/2023. Procalcitonin was 36.54.     Elevated troponins, demand ischemia: Peak troponin 6,095.  S/p left heart cath on 11/10/2023 with did  not show any evidence of significant CAD but there was evidence of mildly to moderately elevated left ventricular filling pressure (18 mm) suggestive of diastolic dysfunction.  Elevated troponins likely from demand ischemia.  Continue aspirin and Lipitor. Cardiogenic shock felt to be unlikely     Acute diastolic heart failure: Improved.  She was treated with IV Lasix. 2D echo on 11/07/2023 showed EF estimated at 50 to 55%, indeterminate LV diastolic parameters, moderate MR     Paroxysmal tachycardia/SVT: She converted to NSR.  Continue metoprolol.     Hypokalemia, hyponatremia and hypomagnesemia: Improved     Anemia with thrombocytopenia: Improved.  Probably from acute illness.       Comorbidities include dyslipidemia, hypertension    Her condition has improved and she is deemed stable for discharge to home today.         Consultants: Cardiologist Procedures performed: Left heart cath on 11/10/2023 Disposition: Home Diet recommendation:  Discharge Diet Orders (From admission, onward)     Start     Ordered   11/12/23 0000  Diet - low sodium heart healthy        11/12/23 1128           Cardiac diet DISCHARGE MEDICATION: Allergies as of 11/12/2023   No Known Allergies      Medication List     STOP taking these medications    nitrofurantoin (macrocrystal-monohydrate) 100 MG capsule Commonly known as: MACROBID       TAKE these medications    albuterol 108 (90 Base) MCG/ACT inhaler Commonly known as: VENTOLIN HFA Inhale 2 puffs into  the lungs every 6 (six) hours as needed for wheezing or shortness of breath.   amLODipine 2.5 MG tablet Commonly known as: NORVASC Take 1 tablet (2.5 mg total) by mouth daily.   amoxicillin 500 MG capsule Commonly known as: AMOXIL Take 2 capsules (1,000 mg total) by mouth 3 (three) times daily for 3 days.   aspirin EC 81 MG tablet Take 1 tablet (81 mg total) by mouth daily. Swallow whole. Start taking on: November 13, 2023    atorvastatin 40 MG tablet Commonly known as: LIPITOR Take 1 tablet by mouth once daily   fluticasone-salmeterol 100-50 MCG/ACT Aepb Commonly known as: ADVAIR Inhale 1 puff into the lungs 2 (two) times daily.   Glucosamine 500 MG Caps Take by mouth.   HAIR/SKIN/NAILS PO Take 1 tablet by mouth daily.   IRON-VITAMIN C PO Take 1 tablet by mouth daily.   metoprolol tartrate 50 MG tablet Commonly known as: LOPRESSOR Take 1 tablet (50 mg total) by mouth 2 (two) times daily. What changed: when to take this   valsartan-hydrochlorothiazide 80-12.5 MG tablet Commonly known as: DIOVAN-HCT Take 2 tablets by mouth once daily   Vitamin D3 25 MCG (1000 UT) Caps Take 1,000 Units by mouth daily.        Discharge Exam: Filed Weights   11/06/23 2349  Weight: 69.4 kg   GEN: NAD SKIN: Warm and dry EYES: No pallor or icterus ENT: MMM CV: RRR PULM: CTA B ABD: soft, ND, NT, +BS CNS: AAO x 3, non focal EXT: No edema or tenderness   Condition at discharge: good  The results of significant diagnostics from this hospitalization (including imaging, microbiology, ancillary and laboratory) are listed below for reference.   Imaging Studies: CARDIAC CATHETERIZATION Result Date: 11/10/2023 Conclusions: No angiographically significant coronary artery disease.  I suspect elevated troponin on admission was due to supply-demand mismatch in the setting of septic shock from E. coli bacteremia. Mildly to moderately elevated left ventricular filling pressure (18 mmHg, 30 mmHg post a-wave) suggestive of diastolic dysfunction. Recommendations: Gentle diuresis. Primary prevention of coronary artery disease. Ongoing treatment of E. coli bacteremia per primary team. Yvonne Kendall, MD [facility]  CT ABDOMEN PELVIS WO CONTRAST Result Date: 11/07/2023 CLINICAL DATA:  Sepsis EXAM: CT ABDOMEN AND PELVIS WITHOUT CONTRAST TECHNIQUE: Multidetector CT imaging of the abdomen and pelvis was performed  following the standard protocol without IV contrast. RADIATION DOSE REDUCTION: This exam was performed according to the departmental dose-optimization program which includes automated exposure control, adjustment of the mA and/or kV according to patient size and/or use of iterative reconstruction technique. COMPARISON:  11/07/2023 chest CT FINDINGS: Lower chest: Trace bilateral pleural effusions have developed in the interim. No acute airspace disease. Hepatobiliary: Unremarkable unenhanced appearance of the liver and gallbladder. Pancreas: Unremarkable unenhanced appearance. Spleen: Unremarkable unenhanced appearance. Adrenals/Urinary Tract: No urinary tract calculi or obstructive uropathy within either kidney. Right adrenal calcifications likely reflect prior infection or hemorrhage. Left adrenal is unremarkable. Bladder is moderately distended without filling defect. Stomach/Bowel: No bowel obstruction or ileus. Normal appendix right lower quadrant. Diverticulosis of the descending and sigmoid colon. No evidence of acute diverticulitis. No bowel wall thickening or inflammatory change. Small hiatal hernia. Vascular/Lymphatic: Aortic atherosclerosis. No enlarged abdominal or pelvic lymph nodes. Reproductive: Status post hysterectomy. No adnexal masses. Other: No free fluid or free intraperitoneal gas. No abdominal wall hernia. Musculoskeletal: No acute or destructive bony abnormalities. Reconstructed images demonstrate no additional findings. IMPRESSION: 1. Distal colonic diverticulosis without evidence of acute diverticulitis. 2.  Trace bilateral pleural effusions, new since chest CT performed earlier today. 3. Small hiatal hernia. 4.  Aortic Atherosclerosis (ICD10-I70.0). Electronically Signed   By: Sharlet Salina M.D.   On: 11/07/2023 17:49   ECHOCARDIOGRAM COMPLETE Result Date: 11/07/2023    ECHOCARDIOGRAM REPORT   Patient Name:   Amanda Cruz Date of Exam: 11/07/2023 Medical Rec #:  [id number]         Height:       63.0 in Accession #:    [id number]       Weight:       153.0 lb Date of Birth:  March 06, 1946         BSA:          1.726 m Patient Age:    77 years         BP:           100/59 mmHg Patient Gender: F                HR:           100 bpm. Exam Location:  [facility] Procedure: 2D Echo, Cardiac Doppler and Color Doppler (Both Spectral and Color            Flow Doppler were utilized during procedure). Indications:     NSTEMI I21.4  History:         Patient has no prior history of Echocardiogram examinations.                  Risk Factors:Hypertension.  Sonographer:     Cristela Blue Referring Phys:  [id number] JAN A MANSY Diagnosing Phys: Lorine Bears MD IMPRESSIONS  1. Left ventricular ejection fraction, by estimation, is 50 to 55%. The left ventricle has low normal function. The left ventricle has no regional wall motion abnormalities. There is moderate left ventricular hypertrophy. Left ventricular diastolic parameters are indeterminate.  2. Right ventricular systolic function is normal. The right ventricular size is normal. There is normal pulmonary artery systolic pressure.  3. The mitral valve is normal in structure. Moderate mitral valve regurgitation. No evidence of mitral stenosis.  4. The aortic valve is normal in structure. Aortic valve regurgitation is not visualized. No aortic stenosis is present.  5. The inferior vena cava is normal in size with greater than 50% respiratory variability, suggesting right atrial pressure of 3 mmHg. FINDINGS  Left Ventricle: Left ventricular ejection fraction, by estimation, is 50 to 55%. The left ventricle has low normal function. The left ventricle has no regional wall motion abnormalities. The left ventricular internal cavity size was normal in size. There is moderate left ventricular hypertrophy. Left ventricular diastolic parameters are indeterminate. Right Ventricle: The right ventricular size is normal. No increase in right ventricular wall thickness. Right  ventricular systolic function is normal. There is normal pulmonary artery systolic pressure. The tricuspid regurgitant velocity is 2.34 m/s, and  with an assumed right atrial pressure of 3 mmHg, the estimated right ventricular systolic pressure is 24.9 mmHg. Left Atrium: Left atrial size was normal in size. Right Atrium: Right atrial size was normal in size. Pericardium: There is no evidence of pericardial effusion. Mitral Valve: The mitral valve is normal in structure. Moderate mitral valve regurgitation. No evidence of mitral valve stenosis. Tricuspid Valve: The tricuspid valve is normal in structure. Tricuspid valve regurgitation is trivial. No evidence of tricuspid stenosis. Aortic Valve: The aortic valve is normal in structure. Aortic valve regurgitation is not visualized. No aortic stenosis is present. Aortic valve  mean gradient measures 2.0 mmHg. Aortic valve peak gradient measures 3.2 mmHg. Aortic valve area, by VTI measures 2.89 cm. Pulmonic Valve: The pulmonic valve was normal in structure. Pulmonic valve regurgitation is mild. No evidence of pulmonic stenosis. Aorta: The aortic root is normal in size and structure. Venous: The inferior vena cava is normal in size with greater than 50% respiratory variability, suggesting right atrial pressure of 3 mmHg. IAS/Shunts: No atrial level shunt detected by color flow Doppler.  LEFT VENTRICLE PLAX 2D LVIDd:         3.30 cm LVIDs:         2.60 cm LV PW:         1.30 cm LV IVS:        1.40 cm LVOT diam:     2.10 cm LV SV:         44 LV SV Index:   25 LVOT Area:     3.46 cm  LV Volumes (MOD) LV vol d, MOD A2C: 69.0 ml LV vol d, MOD A4C: 59.9 ml LV vol s, MOD A2C: 24.8 ml LV vol s, MOD A4C: 28.5 ml LV SV MOD A2C:     44.2 ml LV SV MOD A4C:     59.9 ml LV SV MOD BP:      38.8 ml RIGHT VENTRICLE RV Basal diam:  3.20 cm RV Mid diam:    2.70 cm RV S prime:     13.10 cm/s TAPSE (M-mode): 1.8 cm LEFT ATRIUM             Index        RIGHT ATRIUM           Index LA diam:         4.20 cm 2.43 cm/m   RA Area:     16.10 cm LA Vol (A2C):   43.6 ml 25.27 ml/m  RA Volume:   40.70 ml  23.59 ml/m LA Vol (A4C):   21.5 ml 12.46 ml/m LA Biplane Vol: 33.9 ml 19.65 ml/m  AORTIC VALVE AV Area (Vmax):    2.68 cm AV Area (Vmean):   2.43 cm AV Area (VTI):     2.89 cm AV Vmax:           89.90 cm/s AV Vmean:          59.700 cm/s AV VTI:            0.151 m AV Peak Grad:      3.2 mmHg AV Mean Grad:      2.0 mmHg LVOT Vmax:         69.50 cm/s LVOT Vmean:        41.800 cm/s LVOT VTI:          0.126 m LVOT/AV VTI ratio: 0.83  AORTA Ao Root diam: 3.50 cm MITRAL VALVE                TRICUSPID VALVE MV Area (PHT): 4.36 cm     TR Peak grad:   21.9 mmHg MV Decel Time: 174 msec     TR Vmax:        234.00 cm/s MV E velocity: 103.00 cm/s                             SHUNTS  Systemic VTI:  0.13 m                             Systemic Diam: 2.10 cm Lorine Bears MD Electronically signed by Lorine Bears MD Signature Date/Time: 11/07/2023/4:14:04 PM    Final    CT Angio Chest PE W and/or Wo Contrast Result Date: 11/07/2023 CLINICAL DATA:  Evaluate for PE.  Dyspnea and tachycardia. EXAM: CT ANGIOGRAPHY CHEST WITH CONTRAST TECHNIQUE: Multidetector CT imaging of the chest was performed using the standard protocol during bolus administration of intravenous contrast. Multiplanar CT image reconstructions and MIPs were obtained to evaluate the vascular anatomy. RADIATION DOSE REDUCTION: This exam was performed according to the departmental dose-optimization program which includes automated exposure control, adjustment of the mA and/or kV according to patient size and/or use of iterative reconstruction technique. CONTRAST:  75mL OMNIPAQUE IOHEXOL 350 MG/ML SOLN COMPARISON:  None Available. FINDINGS: Cardiovascular: Satisfactory opacification of the pulmonary arteries to the segmental level. No evidence of pulmonary embolism. The heart is enlarged. There are atherosclerotic calcifications of  the aorta. No pericardial effusion. Mediastinum/Nodes: No enlarged mediastinal, hilar, or axillary lymph nodes. Thyroid gland, trachea, and esophagus demonstrate no significant findings. There is a small hiatal hernia. Lungs/Pleura: There is a 3 mm right upper lobe nodule image 5/70. There is a small amount of atelectasis in the medial right upper lobe, right middle lobe and left lower lobe. The lungs are otherwise clear. There some central peribronchial wall thickening. There is no pleural effusion or pneumothorax. Upper Abdomen: No acute abnormality. Musculoskeletal: No chest wall abnormality. No acute or significant osseous findings. Review of the MIP images confirms the above findings. IMPRESSION: 1. No evidence for pulmonary embolism. 2. Cardiomegaly. 3. Central peribronchial wall thickening worrisome for bronchitis. 4. Right solid pulmonary nodule within the upper lobe measuring 3 mm. Per Fleischner Society Guidelines, if patient is low risk for malignancy, no routine follow-up imaging is recommended. If patient is high risk for malignancy, a non-contrast Chest CT at 12 months is optional. If performed and the nodule is stable at 12 months, no further follow-up is recommended. These guidelines do not apply to immunocompromised patients and patients with cancer. Follow up in patients with significant comorbidities as clinically warranted. For lung cancer screening, adhere to Lung-RADS guidelines. Reference: Radiology. 2017; 284(1):228-43. 5. Small hiatal hernia. 6. Aortic atherosclerosis. Aortic Atherosclerosis (ICD10-I70.0). Electronically Signed   By: Darliss Cheney M.D.   On: 11/07/2023 01:52   CT HEAD WO CONTRAST ( ) Result Date: 11/07/2023 CLINICAL DATA:  Nonfocal encephalopathy EXAM: CT HEAD WITHOUT CONTRAST TECHNIQUE: Contiguous axial images were obtained from the base of the skull through the vertex without intravenous contrast. RADIATION DOSE REDUCTION: This exam was performed according to the  departmental dose-optimization program which includes automated exposure control, adjustment of the mA and/or kV according to patient size and/or use of iterative reconstruction technique. COMPARISON:  None Available. FINDINGS: Brain: Normal anatomic configuration. No abnormal intra or extra-axial mass lesion or fluid collection. No abnormal mass effect or midline shift. No evidence of acute intracranial hemorrhage or infarct. Ventricular size is normal. Cerebellum unremarkable. Vascular: Unremarkable Skull: Intact Sinuses/Orbits: Paranasal sinuses are clear. Orbits are unremarkable. Other: Mastoid air cells and middle ear cavities are clear. IMPRESSION: 1. No acute intracranial abnormality. Electronically Signed   By: Helyn Numbers M.D.   On: 11/07/2023 01:11   DG Chest Port 1 View Result Date: 11/07/2023 CLINICAL DATA:  Sepsis.  Shortness of  breath. EXAM: PORTABLE CHEST 1 VIEW COMPARISON:  Chest x-ray 09/26/2019 FINDINGS: Heart is enlarged. The lungs are clear. There is no pleural effusion or pneumothorax. No acute fractures are seen. IMPRESSION: Cardiomegaly. No acute cardiopulmonary process. Electronically Signed   By: Darliss Cheney M.D.   On: 11/07/2023 00:19    Microbiology: Results for orders placed or performed during the hospital encounter of 11/06/23  Blood Culture (routine x 2)     Status: Abnormal   Collection Time: 11/07/23 12:03 AM   Specimen: BLOOD RIGHT ARM  Result Value Ref Range Status   Specimen Description   Final    BLOOD RIGHT ARM Performed at [facility], [address], [place], [place] [address]    Special Requests   Final    BOTTLES DRAWN AEROBIC AND ANAEROBIC Blood Culture adequate volume Performed at [facility], [address], [place], [place] [address]    Culture  Setup Time   Final    GRAM NEGATIVE RODS IN BOTH AEROBIC AND ANAEROBIC BOTTLES CRITICAL VALUE NOTED.  VALUE IS CONSISTENT WITH PREVIOUSLY REPORTED AND CALLED VALUE. Performed at  [facility], [address] [place], [place] [address]    Culture (A)  Final    ESCHERICHIA COLI SUSCEPTIBILITIES PERFORMED ON PREVIOUS CULTURE WITHIN THE LAST 5 DAYS. Performed at [facility], [address], [place], [place] [address]    Report Status 11/09/2023 FINAL  Final  Blood Culture (routine x 2)     Status: Abnormal   Collection Time: 11/07/23 12:03 AM   Specimen: BLOOD LEFT ARM  Result Value Ref Range Status   Specimen Description   Final    BLOOD LEFT ARM Performed at [facility], [address] [place], [place] [address]    Special Requests   Final    BOTTLES DRAWN AEROBIC AND ANAEROBIC Blood Culture results may not be optimal due to an inadequate volume of blood received in culture bottles Performed at [facility] [address], [place], [place] [address]    Culture  Setup Time   Final    GRAM NEGATIVE RODS IN BOTH AEROBIC AND ANAEROBIC BOTTLES CRITICAL RESULT CALLED TO, READ BACK BY AND VERIFIED WITHMila Merry PHARMD 1132 11/07/23 HNM Performed at [facility], [address] [place], [place] [address]    Culture ESCHERICHIA COLI (A)  Final   Report Status 11/09/2023 FINAL  Final   Organism ID, Bacteria ESCHERICHIA COLI  Final   Organism ID, Bacteria ESCHERICHIA COLI  Final      Susceptibility   Escherichia coli - KIRBY BAUER*    CEFAZOLIN SENSITIVE Sensitive    Escherichia coli - MIC*    AMPICILLIN <=2 SENSITIVE Sensitive     CEFEPIME <=0.12 SENSITIVE Sensitive     CEFTAZIDIME <=1 SENSITIVE Sensitive     CEFTRIAXONE <=0.25 SENSITIVE Sensitive     CIPROFLOXACIN <=0.25 SENSITIVE Sensitive     GENTAMICIN <=1 SENSITIVE Sensitive     IMIPENEM <=0.25 SENSITIVE Sensitive     TRIMETH/SULFA <=20 SENSITIVE Sensitive     AMPICILLIN/SULBACTAM <=2 SENSITIVE Sensitive     PIP/TAZO <=4 SENSITIVE Sensitive ug/mL    * ESCHERICHIA COLI    ESCHERICHIA COLI  Blood Culture ID Panel (Reflexed)     Status: Abnormal    Collection Time: 11/07/23 12:03 AM  Result Value Ref Range Status   Enterococcus faecalis NOT DETECTED NOT DETECTED Final   Enterococcus Faecium NOT DETECTED NOT DETECTED Final   Listeria monocytogenes  NOT DETECTED NOT DETECTED Final   Staphylococcus species NOT DETECTED NOT DETECTED Final   Staphylococcus aureus (BCID) NOT DETECTED NOT DETECTED Final   Staphylococcus epidermidis NOT DETECTED NOT DETECTED Final   Staphylococcus lugdunensis NOT DETECTED NOT DETECTED Final   Streptococcus species NOT DETECTED NOT DETECTED Final   Streptococcus agalactiae NOT DETECTED NOT DETECTED Final   Streptococcus pneumoniae NOT DETECTED NOT DETECTED Final   Streptococcus pyogenes NOT DETECTED NOT DETECTED Final   A.calcoaceticus-baumannii NOT DETECTED NOT DETECTED Final   Bacteroides fragilis NOT DETECTED NOT DETECTED Final   Enterobacterales DETECTED (A) NOT DETECTED Final    Comment: Enterobacterales represent a large order of gram negative bacteria, not a single organism. CRITICAL RESULT CALLED TO, READ BACK BY AND VERIFIED WITH: Mila Merry PHARMD 1132 11/07/23 HNM    Enterobacter cloacae complex NOT DETECTED NOT DETECTED Final   Escherichia coli DETECTED (A) NOT DETECTED Final    Comment: CRITICAL RESULT CALLED TO, READ BACK BY AND VERIFIED WITH: Mila Merry PHARMD 1132 11/07/23 HNM    Klebsiella aerogenes NOT DETECTED NOT DETECTED Final   Klebsiella oxytoca NOT DETECTED NOT DETECTED Final   Klebsiella pneumoniae NOT DETECTED NOT DETECTED Final   Proteus species NOT DETECTED NOT DETECTED Final   Salmonella species NOT DETECTED NOT DETECTED Final   Serratia marcescens NOT DETECTED NOT DETECTED Final   Haemophilus influenzae NOT DETECTED NOT DETECTED Final   Neisseria meningitidis NOT DETECTED NOT DETECTED Final   Pseudomonas aeruginosa NOT DETECTED NOT DETECTED Final   Stenotrophomonas maltophilia NOT DETECTED NOT DETECTED Final   Candida albicans NOT DETECTED NOT DETECTED Final   Candida  auris NOT DETECTED NOT DETECTED Final   Candida glabrata NOT DETECTED NOT DETECTED Final   Candida krusei NOT DETECTED NOT DETECTED Final   Candida parapsilosis NOT DETECTED NOT DETECTED Final   Candida tropicalis NOT DETECTED NOT DETECTED Final   Cryptococcus neoformans/gattii NOT DETECTED NOT DETECTED Final   CTX-M ESBL NOT DETECTED NOT DETECTED Final   Carbapenem resistance IMP NOT DETECTED NOT DETECTED Final   Carbapenem resistance KPC NOT DETECTED NOT DETECTED Final   Carbapenem resistance NDM NOT DETECTED NOT DETECTED Final   Carbapenem resist OXA 48 LIKE NOT DETECTED NOT DETECTED Final   Carbapenem resistance VIM NOT DETECTED NOT DETECTED Final    Comment: Performed at [facility], [address] [place], [place] [address]  Resp panel by RT-PCR (RSV, Flu A&B, Covid) Anterior Nasal Swab     Status: None   Collection Time: 11/07/23  6:39 AM   Specimen: Anterior Nasal Swab  Result Value Ref Range Status   SARS Coronavirus 2 by RT PCR NEGATIVE NEGATIVE Final    Comment: (NOTE) SARS-CoV-2 target nucleic acids are NOT DETECTED.  The SARS-CoV-2 RNA is generally detectable in upper respiratory specimens during the acute phase of infection. The lowest concentration of SARS-CoV-2 viral copies this assay can detect is 138 copies/mL. A negative result does not preclude SARS-Cov-2 infection and should not be used as the sole basis for treatment or other patient management decisions. A negative result may occur with  improper specimen collection/handling, submission of specimen other than nasopharyngeal swab, presence of viral mutation(s) within the areas targeted by this assay, and inadequate number of viral copies(<138 copies/mL). A negative result must be combined with clinical observations, patient history, and epidemiological information. The expected result is Negative.  Fact Sheet for Patients:  BloggerCourse.com  Fact Sheet for Healthcare  Providers:  SeriousBroker.it  This test is no t  yet approved or cleared by the [place] and  has been authorized for detection and/or diagnosis of SARS-CoV-2 by FDA under an Emergency Use Authorization (EUA). This EUA will remain  in effect (meaning this test can be used) for the duration of the COVID-19 declaration under Section 564(b)(1) of the Act, 21 U.S.C.section 360bbb-3(b)(1), unless the authorization is terminated  or revoked sooner.       Influenza A by PCR NEGATIVE NEGATIVE Final   Influenza B by PCR NEGATIVE NEGATIVE Final    Comment: (NOTE) The Xpert Xpress SARS-CoV-2/FLU/RSV plus assay is intended as an aid in the diagnosis of influenza from Nasopharyngeal swab specimens and should not be used as a sole basis for treatment. Nasal washings and aspirates are unacceptable for Xpert Xpress SARS-CoV-2/FLU/RSV testing.  Fact Sheet for Patients: BloggerCourse.com  Fact Sheet for Healthcare Providers: SeriousBroker.it  This test is not yet approved or cleared by the [place] FDA and has been authorized for detection and/or diagnosis of SARS-CoV-2 by FDA under an Emergency Use Authorization (EUA). This EUA will remain in effect (meaning this test can be used) for the duration of the COVID-19 declaration under Section 564(b)(1) of the Act, 21 U.S.C. section 360bbb-3(b)(1), unless the authorization is terminated or revoked.     Resp Syncytial Virus by PCR NEGATIVE NEGATIVE Final    Comment: (NOTE) Fact Sheet for Patients: BloggerCourse.com  Fact Sheet for Healthcare Providers: SeriousBroker.it  This test is not yet approved or cleared by the [place] FDA and has been authorized for detection and/or diagnosis of SARS-CoV-2 by FDA under an Emergency Use Authorization (EUA). This EUA will remain in effect (meaning this test can be  used) for the duration of the COVID-19 declaration under Section 564(b)(1) of the Act, 21 U.S.C. section 360bbb-3(b)(1), unless the authorization is terminated or revoked.  Performed at [facility], [address] [place], [place] [address]   Respiratory (~20 pathogens) panel by PCR     Status: None   Collection Time: 11/07/23  9:20 AM   Specimen: Nasopharyngeal Swab; Respiratory  Result Value Ref Range Status   Adenovirus NOT DETECTED NOT DETECTED Final   Coronavirus 229E NOT DETECTED NOT DETECTED Final    Comment: (NOTE) The Coronavirus on the Respiratory Panel, DOES NOT test for the novel  Coronavirus (2019 nCoV)    Coronavirus HKU1 NOT DETECTED NOT DETECTED Final   Coronavirus NL63 NOT DETECTED NOT DETECTED Final   Coronavirus OC43 NOT DETECTED NOT DETECTED Final   Metapneumovirus NOT DETECTED NOT DETECTED Final   Rhinovirus / Enterovirus NOT DETECTED NOT DETECTED Final   Influenza A NOT DETECTED NOT DETECTED Final   Influenza B NOT DETECTED NOT DETECTED Final   Parainfluenza Virus 1 NOT DETECTED NOT DETECTED Final   Parainfluenza Virus 2 NOT DETECTED NOT DETECTED Final   Parainfluenza Virus 3 NOT DETECTED NOT DETECTED Final   Parainfluenza Virus 4 NOT DETECTED NOT DETECTED Final   Respiratory Syncytial Virus NOT DETECTED NOT DETECTED Final   Bordetella pertussis NOT DETECTED NOT DETECTED Final   Bordetella Parapertussis NOT DETECTED NOT DETECTED Final   Chlamydophila pneumoniae NOT DETECTED NOT DETECTED Final   Mycoplasma pneumoniae NOT DETECTED NOT DETECTED Final    Comment: Performed at [facility], [address], [place], [place] [address]  Culture, blood (x 2)     Status: None   Collection Time: 11/07/23  9:34 AM   Specimen: BLOOD  Result Value Ref Range Status   Specimen Description BLOOD  RIGHT ANTECUBITAL  Final   Special Requests   Final    BOTTLES DRAWN AEROBIC ONLY Blood Culture adequate volume   Culture   Final    NO GROWTH 5  DAYS Performed at [facility], [address] [place], [place] [address]    Report Status 11/12/2023 FINAL  Final  Culture, blood (x 2)     Status: None   Collection Time: 11/07/23  9:34 AM   Specimen: BLOOD  Result Value Ref Range Status   Specimen Description BLOOD BLOOD RIGHT ARM  Final   Special Requests   Final    BOTTLES DRAWN AEROBIC AND ANAEROBIC Blood Culture results may not be optimal due to an inadequate volume of blood received in culture bottles   Culture   Final    NO GROWTH 5 DAYS Performed at [facility], [address] [place], [place] [address]    Report Status 11/12/2023 FINAL  Final  MRSA Next Gen by PCR, Nasal     Status: None   Collection Time: 11/07/23 12:35 PM   Specimen: Nasal Mucosa; Nasal Swab  Result Value Ref Range Status   MRSA by PCR Next Gen NOT DETECTED NOT DETECTED Final    Comment: (NOTE) The GeneXpert MRSA Assay (FDA approved for NASAL specimens only), is one component of a comprehensive MRSA colonization surveillance program. It is not intended to diagnose MRSA infection nor to guide or monitor treatment for MRSA infections. Test performance is not FDA approved in patients less than 43 years old. Performed at [facility], [address] [place], [place] [address]   C Difficile Quick Screen w PCR reflex     Status: None   Collection Time: 11/07/23  4:11 PM   Specimen: STOOL  Result Value Ref Range Status   C Diff antigen NEGATIVE NEGATIVE Final   C Diff toxin NEGATIVE NEGATIVE Final   C Diff interpretation No C. difficile detected.  Final    Comment: Performed at [facility], [address] [place], [place] [address]    Labs: CBC: Recent Labs  Lab 11/07/23 0001 11/07/23 0651 11/07/23 1145 11/08/23 0445 11/09/23 0514 11/10/23 0509  WBC 4.2 SPECIMEN CONTAMINATED, UNABLE TO PERFORM TEST(S). 11.4* 7.1 9.1 6.5  NEUTROABS 3.9 SPECIMEN CONTAMINATED, UNABLE TO PERFORM TEST(S).  --   --   --    --   HGB 11.4* SPECIMEN CONTAMINATED, UNABLE TO PERFORM TEST(S). 11.0* 10.0* 9.5* 11.3*  HCT 30.9* SPECIMEN CONTAMINATED, UNABLE TO PERFORM TEST(S). 29.9* 27.1* 26.5* 32.0*  MCV 83.7 SPECIMEN CONTAMINATED, UNABLE TO PERFORM TEST(S). 84.7 83.6 86.9 86.7  PLT 123* SPECIMEN CONTAMINATED, UNABLE TO PERFORM TEST(S). 128* 127* 162 225   Basic Metabolic Panel: Recent Labs  Lab 11/07/23 0001 11/07/23 0002 11/07/23 0639 11/07/23 0934 11/07/23 1806 11/08/23 0445 11/09/23 0514 11/10/23 0509 11/11/23 0513 11/12/23 0259  NA  --    < >  --    < >  --  132* 134* 137 136 135  K  --    < >  --    < > 4.0 3.2* 3.7 4.2 3.9 4.1  CL  --    < >  --    < >  --  100 103 107 100 101  CO2  --    < >  --    < >  --  22 22 23 25 26   GLUCOSE  --    < >  --    < >  --  132* 134* 101* 104* 108*  BUN  --    < >  --    < >  --  11 13 13 13 14   CREATININE  --    < >  --    < >  --  0.82 0.75 0.68 0.83 0.82  CALCIUM  --    < >  --    < >  --  7.6* 7.8* 8.0* 8.5* 8.4*  MG 1.4*  --   --   --  2.8* 2.6* 2.3 2.1  --   --   PHOS  --   --  1.8*  --   --  2.8 2.4* 2.3*  --   --    < > = values in this interval not displayed.   Liver Function Tests: Recent Labs  Lab 11/07/23 0002 11/07/23 0934  AST 43* 83*  ALT 29 42  ALKPHOS 62 89  BILITOT 0.7 0.5  PROT 6.1* 5.8*  ALBUMIN 3.2* 2.7*   CBG: Recent Labs  Lab 11/07/23 0842  GLUCAP 185*    Discharge time spent: greater than 30 minutes.  Signed: Lurene Shadow, MD [facility] 11/12/2023

## 2024-01-12 ENCOUNTER — Encounter: Payer: Self-pay | Admitting: Pediatrics

## 2024-01-12 ENCOUNTER — Ambulatory Visit (INDEPENDENT_AMBULATORY_CARE_PROVIDER_SITE_OTHER): Admitting: Pediatrics

## 2024-01-12 VITALS — BP 146/69 | HR 71 | Temp 97.7°F | Ht 61.0 in | Wt 150.4 lb

## 2024-01-12 DIAGNOSIS — Z1330 Encounter for screening examination for mental health and behavioral disorders, unspecified: Secondary | ICD-10-CM

## 2024-01-12 DIAGNOSIS — Z131 Encounter for screening for diabetes mellitus: Secondary | ICD-10-CM

## 2024-01-12 DIAGNOSIS — I471 Supraventricular tachycardia, unspecified: Secondary | ICD-10-CM | POA: Diagnosis not present

## 2024-01-12 DIAGNOSIS — I214 Non-ST elevation (NSTEMI) myocardial infarction: Secondary | ICD-10-CM

## 2024-01-12 DIAGNOSIS — I872 Venous insufficiency (chronic) (peripheral): Secondary | ICD-10-CM | POA: Diagnosis not present

## 2024-01-12 DIAGNOSIS — Z13220 Encounter for screening for lipoid disorders: Secondary | ICD-10-CM

## 2024-01-12 DIAGNOSIS — I10 Essential (primary) hypertension: Secondary | ICD-10-CM

## 2024-01-12 DIAGNOSIS — Z7689 Persons encountering health services in other specified circumstances: Secondary | ICD-10-CM

## 2024-01-12 MED ORDER — OLMESARTAN MEDOXOMIL-HCTZ 40-25 MG PO TABS: 1.0000 | ORAL_TABLET | Freq: Every day | ORAL | 2 refills | Status: DC

## 2024-01-12 MED ORDER — METOPROLOL SUCCINATE ER 50 MG PO TB24
50.0000 mg | ORAL_TABLET | Freq: Every day | ORAL | 3 refills | Status: DC
Start: 2024-01-12 — End: 2024-02-02

## 2024-01-12 NOTE — Patient Instructions (Addendum)
 Stop amlodipine , valsartan -hydrochlorothiazide  combination Start: olmesartan-hydrochlorothiazide  (new blood pressure medication) daily Switch to metoprolol  succinate 50mg  daily  Stop aspirin  Stop potassium   After labs, maybe stop iron  For your shoulder: pick up diclofenac gel (voltaren is brand name)  For your hair:     Good to meet you! Welcome to [facility]!  As your primary care doctor, I look forward to working with you to help you reach your health goals.  Please be aware of a couple of logistical items: - If you message me on mychart, it may take me 1-2 business days to get back to you. This is for non-urgent messaging.  - If you require urgent clinical attention, please call the clinic or present to urgent care/emergency room - If you have labs, I typically will send a message about them in 1-2 business days. - I am not here on Mondays, otherwise will be available from Tuesday-Friday during 8a-5pm.

## 2024-01-12 NOTE — Progress Notes (Signed)
 Establish Care Note  BP (!) 146/69   Pulse 71   Temp 97.7 F (36.5 C) (Oral)   Ht 5\' 1"  (1.549 m)   Wt 150 lb 6.4 oz (68.2 kg)   SpO2 98%   BMI 28.42 kg/m    Subjective:    Patient ID: Amanda Cruz, female    DOB: 1946-07-31, 78 y.o.   MRN: [id number]  HPI: Amanda Cruz is a 78 y.o. female  Chief Complaint  Patient presents with   Establish Care    Establishing care, the following was discussed today:  Discussed the use of AI scribe software for clinical note transcription with the patient, who gave verbal consent to proceed.  History of Present Illness   Amanda Cruz is a 78 year old female with hypertension who presents for medication management and follow-up.  No new symptoms have been noted since the last visit in March. No chest pain or shortness of breath. Current medications include metoprolol , taken once daily due to pharmacy instructions, despite being initially prescribed as twice daily. She is also on amlodipine  2.5 mg daily and valsartan  twice daily for hypertension, prescribed by a different doctor after consistently high blood pressure readings. She dislikes amlodipine  due to leg swelling. Additionally, she takes a cholesterol medication and aspirin , though not daily.  She takes over-the-counter supplements including iron  and potassium, but is unsure of their necessity. Potassium was started after a hospital stay in March. She also takes biotin for brittle nails and thinning hair.  A chronic rash on her legs has been biopsied multiple times without a definitive diagnosis and does not respond to treatment. The rash is accompanied by leg swelling, for which she occasionally uses compression stockings.  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 No treatment has been used for this pain. Her husband had RSV after her hospital stay but did not require  hospitalization. She manages his care at home, including treating his bedsores.        Current Outpatient Medications on File Prior to Visit  Medication Sig Dispense Refill   albuterol  (VENTOLIN  HFA) 108 (90 Base) MCG/ACT inhaler Inhale 2 puffs into the lungs every 6 (six) hours as needed for wheezing or shortness of breath. 8 g 0   atorvastatin  (LIPITOR ) 40 MG tablet Take 1 tablet by mouth once daily 90 tablet 1   Biotin w/ Vitamins C & E (HAIR/SKIN/NAILS PO) Take 1 tablet by mouth daily.     Cholecalciferol  (VITAMIN D3) 25 MCG (1000 UT) CAPS Take 1,000 Units by mouth daily.     fluticasone -salmeterol (ADVAIR) 100-50 MCG/ACT AEPB Inhale 1 puff into the lungs 2 (two) times daily. (Patient taking differently: Inhale 1 puff into the lungs 2 (two) times daily as needed (SOB).) 1 each 3   Glucosamine 500 MG CAPS Take by mouth.     metoprolol  tartrate (LOPRESSOR ) 50 MG tablet Take 1 tablet (50 mg total) by mouth 2 (two) times daily. 60 tablet 0   No current facility-administered medications on file prior to visit.    #HM Will review HM records and updated as needed.  Relevant past medical, surgical, family and social history reviewed and updated as indicated. Interim medical history since our last visit reviewed. Allergies and medications reviewed and updated.  ROS per HPI unless specifically indicated above     Objective:     BP (!) 146/69   Pulse 71  Temp 97.7 F (36.5 C) (Oral)   Ht 5\' 1"  (1.549 m)   Wt 150 lb 6.4 oz (68.2 kg)   SpO2 98%   BMI 28.42 kg/m   Wt Readings from Last 3 Encounters:  01/12/24 150 lb 6.4 oz (68.2 kg)  11/06/23 153 lb (69.4 kg)  09/06/22 160 lb (72.6 kg)     Physical Exam Constitutional:      Appearance: Normal appearance.  HENT:     Head: Normocephalic and atraumatic.  Eyes:     Pupils: Pupils are equal, round, and reactive to light.  Cardiovascular:     Rate and Rhythm: Normal rate and regular rhythm.     Pulses: Normal pulses.     Heart  sounds: Normal heart sounds.  Pulmonary:     Effort: Pulmonary effort is normal.     Breath sounds: Normal breath sounds.  Musculoskeletal:        General: Normal range of motion.     Cervical back: Normal range of motion.  Skin:    General: Skin is warm and dry.     Capillary Refill: Capillary refill takes less than 2 seconds.  Neurological:     General: No focal deficit present.     Mental Status: She is alert. Mental status is at baseline.  Psychiatric:        Mood and Affect: Mood normal.        Behavior: Behavior normal.         07/27/2022    2:48 PM 05/04/2022    2:45 PM 05/28/2021   10:03 AM 04/21/2021    2:10 PM 08/08/2020    2:50 PM  Depression screen PHQ 2/9  Decreased Interest 0 0 0 0 0  Down, Depressed, Hopeless 0 0 0 0 0  PHQ - 2 Score 0 0 0 0 0  Altered sleeping 0      Tired, decreased energy 0      Change in appetite 0      Feeling bad or failure about yourself  0      Trouble concentrating 0      Moving slowly or fidgety/restless 0      Suicidal thoughts 0      PHQ-9 Score 0      Difficult doing work/chores Not difficult at all           Assessment & Plan:  Assessment & Plan   Essential hypertension Assessment & Plan: Chronic hypertension with recent medication adjustments. Current regimen includes amlodipine , metoprolol  tartrate, and valsartan . Plan to simplify regimen by switching to long-acting medications. Suspect leg swelling will improve w dc amlodipine . - Switch metoprolol  tartrate to metoprolol  succinate 50 mg once daily. - Discontinue amlodipine  due to side effects. - Discontinue valsartan  and start olmesartan /hydrochlorothiazide  combination once daily.  Orders: -     Olmesartan  Medoxomil-HCTZ; Take 1 tablet by mouth daily.  Dispense: 30 tablet; Refill: 2 -     Metoprolol  Succinate ER; Take 1 tablet (50 mg total) by mouth daily. Take with or immediately following a meal.  Dispense: 90 tablet; Refill: 3 -     Comprehensive metabolic panel with  GFR  SVT (supraventricular tachycardia) (HCC) Assessment & Plan: Switch to long acting metop tartrate.    NSTEMI (non-ST elevated myocardial infarction) [facility]) Assessment & Plan: Admitted for sepsis  in March, found to have elevated troponins, from demand demand ischemia.  S/p left heart cath on 11/10/2023 with did not show any evidence of significant CAD. She would  like to stop ASA due to bleeding risk, reasonable given cath findings. Continue statin.  Orders: -     Olmesartan  Medoxomil-HCTZ; Take 1 tablet by mouth daily.  Dispense: 30 tablet; Refill: 2 -     Metoprolol  Succinate ER; Take 1 tablet (50 mg total) by mouth daily. Take with or immediately following a meal.  Dispense: 90 tablet; Refill: 3 -     Iron , TIBC and Ferritin Panel -     CBC  Chronic venous insufficiency Assessment & Plan: Chronic leg swelling, likely exacerbated by amlodipine . Compression stockings recommended to manage swelling. - Discontinue amlodipine  to assess impact on leg swelling. - Use compression stockings daily, especially when on feet, to manage swelling. - Re-evaluate leg swelling in follow-up visit.  Encounter to establish care Reviewed available patient record including history, medications, problem list. HM updated as able. Will review and/or request outside records (if applicable) and will fill remaining HM gaps as needed at follow up visit. Did not complete depression screen.  Diabetes mellitus screening -     Hemoglobin A1c  Lipid screening -     Lipid panel  Follow up plan: Return in about 2 weeks (around 01/26/2024) for HTN.  Hadassah Letters, MD

## 2024-01-13 ENCOUNTER — Ambulatory Visit: Payer: Self-pay | Admitting: Pediatrics

## 2024-01-13 LAB — COMPREHENSIVE METABOLIC PANEL WITH GFR
ALT: 22 IU/L (ref 0–32)
AST: 31 IU/L (ref 0–40)
Albumin: 4.3 g/dL (ref 3.8–4.8)
Alkaline Phosphatase: 96 IU/L (ref 44–121)
BUN/Creatinine Ratio: 20 (ref 12–28)
BUN: 20 mg/dL (ref 8–27)
Bilirubin Total: 0.6 mg/dL (ref 0.0–1.2)
CO2: 24 mmol/L (ref 20–29)
Calcium: 9.2 mg/dL (ref 8.7–10.3)
Chloride: 102 mmol/L (ref 96–106)
Creatinine, Ser: 1.02 mg/dL — ABNORMAL HIGH (ref 0.57–1.00)
Globulin, Total: 2.4 g/dL (ref 1.5–4.5)
Glucose: 98 mg/dL (ref 70–99)
Potassium: 3.6 mmol/L (ref 3.5–5.2)
Sodium: 143 mmol/L (ref 134–144)
Total Protein: 6.7 g/dL (ref 6.0–8.5)
eGFR: 56 mL/min/{1.73_m2} — ABNORMAL LOW (ref >59)

## 2024-01-13 LAB — CBC
Hematocrit: 39 % (ref 34.0–46.6)
Hemoglobin: 12.9 g/dL (ref 11.1–15.9)
MCH: 30.3 pg (ref 26.6–33.0)
MCHC: 33.1 g/dL (ref 31.5–35.7)
MCV: 92 fL (ref 79–97)
Platelets: 169 10*3/uL (ref 150–450)
RBC: 4.26 x10E6/uL (ref 3.77–5.28)
RDW: 13 % (ref 11.7–15.4)
WBC: 4.4 10*3/uL (ref 3.4–10.8)

## 2024-01-13 LAB — LIPID PANEL
Chol/HDL Ratio: 2.3 ratio (ref 0.0–4.4)
Cholesterol, Total: 107 mg/dL (ref 100–199)
HDL: 47 mg/dL (ref >39)
LDL Chol Calc (NIH): 46 mg/dL (ref 0–99)
Triglycerides: 63 mg/dL (ref 0–149)
VLDL Cholesterol Cal: 14 mg/dL (ref 5–40)

## 2024-01-13 LAB — IRON,TIBC AND FERRITIN PANEL
Ferritin: 260 ng/mL — ABNORMAL HIGH (ref 15–150)
Iron Saturation: 25 % (ref 15–55)
Iron: 63 ug/dL (ref 27–139)
Total Iron Binding Capacity: 250 ug/dL (ref 250–450)
UIBC: 187 ug/dL (ref 118–369)

## 2024-01-13 LAB — HEMOGLOBIN A1C
Est. average glucose Bld gHb Est-mCnc: 105 mg/dL
Hgb A1c MFr Bld: 5.3 % (ref 4.8–5.6)

## 2024-01-20 ENCOUNTER — Encounter: Payer: Self-pay | Admitting: Pediatrics

## 2024-01-20 NOTE — Assessment & Plan Note (Signed)
 Switch to long acting metop tartrate.

## 2024-01-20 NOTE — Assessment & Plan Note (Signed)
 Chronic leg swelling, likely exacerbated by amlodipine . Compression stockings recommended to manage swelling. - Discontinue amlodipine  to assess impact on leg swelling. - Use compression stockings daily, especially when on feet, to manage swelling. - Re-evaluate leg swelling in follow-up visit.

## 2024-01-20 NOTE — Assessment & Plan Note (Addendum)
 Chronic hypertension with recent medication adjustments. Current regimen includes amlodipine , metoprolol  tartrate, and valsartan . Plan to simplify regimen by switching to long-acting medications. Suspect leg swelling will improve w dc amlodipine . - Switch metoprolol  tartrate to metoprolol  succinate 50 mg once daily. - Discontinue amlodipine  due to side effects. - Discontinue valsartan  and start olmesartan /hydrochlorothiazide  combination once daily.

## 2024-01-20 NOTE — Assessment & Plan Note (Signed)
 Admitted for sepsis  in March, found to have elevated troponins, from demand demand ischemia.  S/p left heart cath on 11/10/2023 with did not show any evidence of significant CAD. She would like to stop ASA due to bleeding risk, reasonable given cath findings. Continue statin.

## 2024-02-02 ENCOUNTER — Encounter: Payer: Self-pay | Admitting: Pediatrics

## 2024-02-02 ENCOUNTER — Ambulatory Visit (INDEPENDENT_AMBULATORY_CARE_PROVIDER_SITE_OTHER): Admitting: Pediatrics

## 2024-02-02 VITALS — BP 183/87 | HR 69 | Temp 97.9°F | Wt 150.4 lb

## 2024-02-02 DIAGNOSIS — I214 Non-ST elevation (NSTEMI) myocardial infarction: Secondary | ICD-10-CM

## 2024-02-02 DIAGNOSIS — J309 Allergic rhinitis, unspecified: Secondary | ICD-10-CM | POA: Insufficient documentation

## 2024-02-02 DIAGNOSIS — I10 Essential (primary) hypertension: Secondary | ICD-10-CM | POA: Diagnosis not present

## 2024-02-02 MED ORDER — METOPROLOL SUCCINATE ER 100 MG PO TB24: 100.0000 mg | ORAL_TABLET | Freq: Every day | ORAL | 2 refills | Status: DC

## 2024-02-02 NOTE — Progress Notes (Signed)
 Office Visit  BP (!) 183/87   Pulse 69   Temp 97.9 F (36.6 C) (Oral)   Wt 150 lb 6.4 oz (68.2 kg)   SpO2 100%   BMI 28.42 kg/m    Subjective:    Patient ID: Amanda Cruz, female    DOB: 1946/01/15, 78 y.o.   MRN: [id number]  HPI: Amanda Cruz is a 78 y.o. female  Chief Complaint  Patient presents with   Hypertension    Discussed the use of AI scribe software for clinical note transcription with the patient, who gave verbal consent to proceed.  History of Present Illness   Amanda Cruz is a 79 year old female with hypertension who presents for blood pressure management.  She has been taking her new medications, but her blood pressure remains elevated. She was previously on metoprolol , which was switched to a long-acting form. She also reports intermittent swelling, which she believes has improved since stopping amlodipine .  She experiences a persistent cough with an itchy throat, which she attributes to allergies. She has been taking Zyrtec but has not tried switching to Claritin. She occasionally uses Flonase but not regularly.  Her social situation is complex as she is the primary caregiver for her husband, who has significant health issues. She has no external help at home and relies on her sons for assistance when needed. Her daughter, who recently moved in with her, is not providing the expected support and has added to her stress. She feels overwhelmed by her caregiving responsibilities and household duties.        Relevant past medical, surgical, family and social history reviewed and updated as indicated. Interim medical history since our last visit reviewed. Allergies and medications reviewed and updated.  ROS per HPI unless specifically indicated above     Objective:     BP (!) 183/87   Pulse 69   Temp 97.9 F (36.6 C) (Oral)   Wt 150 lb 6.4 oz (68.2 kg)   SpO2 100%   BMI 28.42 kg/m   Wt Readings from Last 3 Encounters:  02/02/24 150 lb  6.4 oz (68.2 kg)  01/12/24 150 lb 6.4 oz (68.2 kg)  11/06/23 153 lb (69.4 kg)     Physical Exam Constitutional:      Appearance: Normal appearance.  HENT:     Head: Normocephalic and atraumatic.  Eyes:     Pupils: Pupils are equal, round, and reactive to light.  Cardiovascular:     Rate and Rhythm: Normal rate and regular rhythm.     Pulses: Normal pulses.     Heart sounds: Normal heart sounds.  Pulmonary:     Effort: Pulmonary effort is normal.     Breath sounds: Normal breath sounds.  Musculoskeletal:        General: Normal range of motion.     Cervical back: Normal range of motion.     Right lower leg: No edema.     Left lower leg: No edema.  Skin:    General: Skin is warm and dry.     Capillary Refill: Capillary refill takes less than 2 seconds.  Neurological:     General: No focal deficit present.     Mental Status: She is alert. Mental status is at baseline.  Psychiatric:        Mood and Affect: Mood normal.        Behavior: Behavior normal.         02/02/2024   10:41 AM  07/27/2022    2:48 PM 05/04/2022    2:45 PM 05/28/2021   10:03 AM 04/21/2021    2:10 PM  Depression screen PHQ 2/9  Decreased Interest 0 0 0 0 0  Down, Depressed, Hopeless 1 0 0 0 0  PHQ - 2 Score 1 0 0 0 0  Altered sleeping 0 0     Tired, decreased energy 1 0     Change in appetite 0 0     Feeling bad or failure about yourself  1 0     Trouble concentrating 0 0     Moving slowly or fidgety/restless 0 0     Suicidal thoughts 0 0     PHQ-9 Score 3 0     Difficult doing work/chores Not difficult at all Not difficult at all          02/02/2024   10:42 AM  GAD 7 : Generalized Anxiety Score  Nervous, Anxious, on Edge 0  Control/stop worrying 0  Worry too much - different things 0  Trouble relaxing 0  Restless 0  Easily annoyed or irritable 0  Afraid - awful might happen 0  Total GAD 7 Score 0  Anxiety Difficulty Not difficult at all       Assessment & Plan:  Assessment & Plan    Essential hypertension NSTEMI (non-ST elevated myocardial infarction) [facility]) Assessment & Plan: Hypertension uncontrolled with recent changes. Previous amlodipine  discontinued due to edema (now resolved). Metoprolol  switched to long-acting formulation without improvement. - Increase metoprolol  to 100 mg daily. - Continue olmesartan  and hydrochlorothiazide . - Reassess blood pressure in two weeks.  Orders: -     Metoprolol  Succinate ER; Take 1 tablet (100 mg total) by mouth daily. Take with or immediately following a meal.  Dispense: 30 tablet; Refill: 2 -     Basic metabolic panel with GFR -     Metoprolol  Succinate ER; Take 1 tablet (100 mg total) by mouth daily. Take with or immediately following a meal.  Dispense: 30 tablet; Refill: 2  Allergic rhinitis, unspecified seasonality, unspecified trigger Intermittent throat itching and cough likely due to allergic rhinitis. Zyrtec ineffective, suggesting tolerance. - Switch from Zyrtec to Claritin daily. - Use Flonase nasal spray daily.    Follow up plan: Return in about 1 week (around 02/09/2024) for HTN.  Hadassah Letters, MD

## 2024-02-02 NOTE — Patient Instructions (Addendum)
 For blood pressure: -Increase metoprolol  succinate to 100mg  (you can take two of the medications you have at home) -Continue benicar  (olmesartan  and hydrochlorothiazide ) 40-25mg 

## 2024-02-02 NOTE — Assessment & Plan Note (Signed)
 Hypertension remains uncontrolled. Previous amlodipine  discontinued due to edema. Metoprolol  switched to long-acting formulation without improvement. - Increase metoprolol  to 100 mg daily. - Continue olmesartan  and hydrochlorothiazide . - Reassess blood pressure in two weeks.

## 2024-02-03 LAB — BASIC METABOLIC PANEL WITH GFR
BUN/Creatinine Ratio: 18 (ref 12–28)
BUN: 15 mg/dL (ref 8–27)
CO2: 25 mmol/L (ref 20–29)
Calcium: 9.7 mg/dL (ref 8.7–10.3)
Chloride: 99 mmol/L (ref 96–106)
Creatinine, Ser: 0.85 mg/dL (ref 0.57–1.00)
Glucose: 104 mg/dL — ABNORMAL HIGH (ref 70–99)
Potassium: 3.2 mmol/L — ABNORMAL LOW (ref 3.5–5.2)
Sodium: 141 mmol/L (ref 134–144)
eGFR: 70 mL/min/{1.73_m2} (ref >59)

## 2024-02-06 ENCOUNTER — Ambulatory Visit: Payer: Self-pay | Admitting: Pediatrics

## 2024-02-17 ENCOUNTER — Ambulatory Visit (INDEPENDENT_AMBULATORY_CARE_PROVIDER_SITE_OTHER): Admitting: Pediatrics

## 2024-02-17 ENCOUNTER — Encounter: Payer: Self-pay | Admitting: Pediatrics

## 2024-02-17 VITALS — BP 162/72 | HR 65 | Temp 97.8°F | Wt 150.8 lb

## 2024-02-17 DIAGNOSIS — I10 Essential (primary) hypertension: Secondary | ICD-10-CM | POA: Diagnosis not present

## 2024-02-17 DIAGNOSIS — E876 Hypokalemia: Secondary | ICD-10-CM | POA: Diagnosis not present

## 2024-02-17 MED ORDER — SPIRONOLACTONE 50 MG PO TABS: 50.0000 mg | ORAL_TABLET | Freq: Every day | ORAL | 2 refills | Status: DC

## 2024-02-17 NOTE — Patient Instructions (Signed)
 Return 7/2 at 240pm  We are starting a new blood pressure medication: spironolactone 50mg 

## 2024-02-17 NOTE — Progress Notes (Unsigned)
   Office Visit  BP (!) 162/72   Pulse 65   Temp 97.8 F (36.6 C) (Oral)   Wt 150 lb 12.8 oz (68.4 kg)   SpO2 95%   BMI 28.49 kg/m    Subjective:    Patient ID: Amanda Cruz, female    DOB: 1946/04/24, 78 y.o.   MRN: [id number]  HPI: Amanda Cruz is a 78 y.o. female  Chief Complaint  Patient presents with   Hypertension    Discussed the use of AI scribe software for clinical note transcription with the patient, who gave verbal consent to proceed.  History of Present Illness     Relevant past medical, surgical, family and social history reviewed and updated as indicated. Interim medical history since our last visit reviewed. Allergies and medications reviewed and updated.  ROS per HPI unless specifically indicated above     Objective:    BP (!) 162/72   Pulse 65   Temp 97.8 F (36.6 C) (Oral)   Wt 150 lb 12.8 oz (68.4 kg)   SpO2 95%   BMI 28.49 kg/m   Wt Readings from Last 3 Encounters:  02/17/24 150 lb 12.8 oz (68.4 kg)  02/02/24 150 lb 6.4 oz (68.2 kg)  01/12/24 150 lb 6.4 oz (68.2 kg)     Physical Exam      02/17/2024    1:14 PM 02/02/2024   10:41 AM 07/27/2022    2:48 PM 05/04/2022    2:45 PM 05/28/2021   10:03 AM  Depression screen PHQ 2/9  Decreased Interest 0 0 0 0 0  Down, Depressed, Hopeless 0 1 0 0 0  PHQ - 2 Score 0 1 0 0 0  Altered sleeping 0 0 0    Tired, decreased energy 0 1 0    Change in appetite 0 0 0    Feeling bad or failure about yourself  0 1 0    Trouble concentrating 0 0 0    Moving slowly or fidgety/restless 0 0 0    Suicidal thoughts 0 0 0    PHQ-9 Score 0 3 0    Difficult doing work/chores Not difficult at all Not difficult at all Not difficult at all         02/17/2024    1:14 PM 02/02/2024   10:42 AM  GAD 7 : Generalized Anxiety Score  Nervous, Anxious, on Edge 0 0  Control/stop worrying 0 0  Worry too much - different things 0 0  Trouble relaxing 0 0  Restless 0 0  Easily annoyed or irritable 0 0  Afraid -  awful might happen 0 0  Total GAD 7 Score 0 0  Anxiety Difficulty Not difficult at all Not difficult at all       Assessment & Plan:  Assessment & Plan   There are no diagnoses linked to this encounter.   Assessment and Plan Assessment & Plan      Follow up plan: No follow-ups on file.  Hadassah SHAUNNA Nett, MD

## 2024-02-18 LAB — BASIC METABOLIC PANEL WITH GFR
BUN/Creatinine Ratio: 21 (ref 12–28)
BUN: 20 mg/dL (ref 8–27)
CO2: 24 mmol/L (ref 20–29)
Calcium: 9.3 mg/dL (ref 8.7–10.3)
Chloride: 99 mmol/L (ref 96–106)
Creatinine, Ser: 0.94 mg/dL (ref 0.57–1.00)
Glucose: 89 mg/dL (ref 70–99)
Potassium: 3.5 mmol/L (ref 3.5–5.2)
Sodium: 138 mmol/L (ref 134–144)
eGFR: 62 mL/min/{1.73_m2} (ref >59)

## 2024-02-21 ENCOUNTER — Ambulatory Visit: Payer: Self-pay | Admitting: Pediatrics

## 2024-02-22 ENCOUNTER — Encounter: Payer: Self-pay | Admitting: Pediatrics

## 2024-02-22 NOTE — Assessment & Plan Note (Signed)
 Hypertension remains elevated. Amlodipine  discontinued due to leg edema. Spironolactone  chosen for hypertension and edema. Metoprolol  dose unchanged due to bradycardia. Spironolactone  expected to maintain potassium levels due to its potassium-sparing properties. - Monitor potassium levels with blood work. - Prescribe spironolactone  50 mg daily. - Order blood work to monitor potassium.

## 2024-02-24 ENCOUNTER — Other Ambulatory Visit: Payer: Self-pay | Admitting: Pediatrics

## 2024-02-24 DIAGNOSIS — N3281 Overactive bladder: Secondary | ICD-10-CM

## 2024-02-24 MED ORDER — OXYBUTYNIN CHLORIDE ER 5 MG PO TB24: 5.0000 mg | ORAL_TABLET | Freq: Every day | ORAL | 1 refills | Status: DC

## 2024-02-24 NOTE — Progress Notes (Signed)
 See mychart message thread. Sending oxybut for overactive bladder. Suspect hydrochlorothiazide  contributing. Will discuss further at follow up visit next week.  Hadassah SHAUNNA Nett, MD

## 2024-02-29 ENCOUNTER — Ambulatory Visit: Admitting: Pediatrics

## 2024-02-29 ENCOUNTER — Encounter: Payer: Self-pay | Admitting: Pediatrics

## 2024-02-29 VITALS — BP 145/73 | HR 66

## 2024-02-29 DIAGNOSIS — T148XXA Other injury of unspecified body region, initial encounter: Secondary | ICD-10-CM | POA: Diagnosis not present

## 2024-02-29 DIAGNOSIS — R21 Rash and other nonspecific skin eruption: Secondary | ICD-10-CM | POA: Diagnosis not present

## 2024-02-29 DIAGNOSIS — N3281 Overactive bladder: Secondary | ICD-10-CM

## 2024-02-29 DIAGNOSIS — I10 Essential (primary) hypertension: Secondary | ICD-10-CM

## 2024-02-29 MED ORDER — DOXYCYCLINE HYCLATE 100 MG PO TABS: 100.0000 mg | ORAL_TABLET | Freq: Two times a day (BID) | ORAL | 0 refills | Status: DC

## 2024-02-29 MED ORDER — TRIAMCINOLONE ACETONIDE 0.025 % EX OINT: 1.0000 | TOPICAL_OINTMENT | Freq: Two times a day (BID) | CUTANEOUS | 0 refills | Status: DC

## 2024-02-29 MED ORDER — SPIRONOLACTONE 100 MG PO TABS: 100.0000 mg | ORAL_TABLET | Freq: Every day | ORAL | 2 refills | Status: DC

## 2024-02-29 NOTE — Progress Notes (Signed)
 Office Visit  BP (!) 145/73   Pulse 66    Subjective:    Patient ID: Amanda Cruz, female    DOB: 03-16-1946, 78 y.o.   MRN: [id number]  HPI: Amanda Cruz is a 78 y.o. female  Chief Complaint  Patient presents with   Hypertension    No concerns. olmesartan -hydrochlorothiazide  (BENICAR  HCT) 40-25 states she has been on this since prescribed. Takes every night   Medication Problem    Has not started oxybutynin  (DITROPAN -XL) 5 MG 24 hr tablet because of all the side effects. Also is not sure if she is taking spironolactone .     Discussed the use of AI scribe software for clinical note transcription with the patient, who gave verbal consent to proceed.  History of Present Illness   Amanda Cruz is a 78 year old female with hypertension who presents for medication management and evaluation of a skin rash.  She is currently taking spironolactone , which was increased from 25 mg to 50 mg for blood pressure management. She confirms adherence to the medication regimen, including the higher dose, and is taking all prescribed medications, including metoprolol  100 mg. Despite this, her blood pressure remains slightly elevated.  She has not started taking oxybutynin  for overactive bladder due to concerns about potential side effects.  She has a skin issue resembling a heat rash, which is itchy but not painful. The rash has been present for a few days and is located on her body. She also has a separate lesion that she initially thought was an insect bite, present for about a week, which is itchy and has caused some swelling. She did not observe any insect or spider at the time of onset.  She spends a significant amount of time outdoors in her yard, tending to her plants and flowers, which may contribute to her exposure to insects or environmental factors causing the skin issues.        Relevant past medical, surgical, family and social history reviewed and updated as indicated.  Interim medical history since our last visit reviewed. Allergies and medications reviewed and updated.  ROS per HPI unless specifically indicated above     Objective:    BP (!) 145/73   Pulse 66   Wt Readings from Last 3 Encounters:  03/06/24 149 lb 6.4 oz (67.8 kg)  02/17/24 150 lb 12.8 oz (68.4 kg)  02/02/24 150 lb 6.4 oz (68.2 kg)     Physical Exam Constitutional:      Appearance: Normal appearance.  Pulmonary:     Effort: Pulmonary effort is normal.  Musculoskeletal:        General: Normal range of motion.  Skin:    Findings: Rash present.     Comments: Left upper extremity with 3-5 small erythematous papules, has larger hyperpigemented scab on distal shing with small erythematous ring. Unclear if eschar formation or dried blood.  Neurological:     General: No focal deficit present.     Mental Status: She is alert. Mental status is at baseline.  Psychiatric:        Mood and Affect: Mood normal.        Behavior: Behavior normal.        Thought Content: Thought content normal.         03/06/2024    4:12 PM 02/29/2024    2:43 PM 02/17/2024    1:14 PM 02/02/2024   10:41 AM 07/27/2022    2:48 PM  Depression screen PHQ  2/9  Decreased Interest 0 0 0 0 0  Down, Depressed, Hopeless 0 0 0 1 0  PHQ - 2 Score 0 0 0 1 0  Altered sleeping  0 0 0 0  Tired, decreased energy  0 0 1 0  Change in appetite  0 0 0 0  Feeling bad or failure about yourself   0 0 1 0  Trouble concentrating  0 0 0 0  Moving slowly or fidgety/restless  0 0 0 0  Suicidal thoughts  0 0 0 0  PHQ-9 Score  0 0 3 0  Difficult doing work/chores   Not difficult at all Not difficult at all Not difficult at all       03/06/2024    4:16 PM 02/29/2024    2:43 PM 02/17/2024    1:14 PM 02/02/2024   10:42 AM  GAD 7 : Generalized Anxiety Score  Nervous, Anxious, on Edge 0 0 0 0  Control/stop worrying 0 0 0 0  Worry too much - different things 0 0 0 0  Trouble relaxing 0 0 0 0  Restless 0 0 0 0  Easily annoyed or  irritable 0 0 0 0  Afraid - awful might happen 0 0 0 0  Total GAD 7 Score 0 0 0 0  Anxiety Difficulty   Not difficult at all Not difficult at all       Assessment & Plan:  Assessment & Plan   Rash Animal bite Lesion consistent with unknown insect bite ?spide ?possible tick bite due to outdoor activities. 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 Suspect contact dermatitis on wrist. Will send ppx doxy and tick testing as below. - Consult with practice partner for second opinion. - Consider antibiotics if confirmed as tick bite. -     Spotted Fever Group Antibodies -     Lyme disease, western blot -     Triamcinolone  Acetonide; Apply 1 Application topically 2 (two) times daily. Do not use more than 14 days at a time  Dispense: 30 g; Refill: 0  Essential hypertension Assessment & Plan: Blood pressure slightly elevated despite spironolactone  increase. Current regimen not achieving target goal. - Increase spironolactone  to 100 mg daily. - Continue metoprolol  100 mg daily and benicar /hydrochlorothiazide  40-25mg   Orders: -     Spironolactone ; Take 1 tablet (100 mg total) by mouth daily.  Dispense: 30 tablet; Refill: 2 -     Basic metabolic panel with GFR   Overactive Bladder Hesitant to take oxybutynin  due to side effect concerns. Informed about safety and rare side effects. - Encourage starting oxybutynin  at lowest dose, option to halve dose if concerned.    Follow up plan: Return in about 4 weeks (around 03/28/2024) for HTN.  Amanda SHAUNNA Nett, MD

## 2024-02-29 NOTE — Patient Instructions (Addendum)
 Increase spironolactone  100mg  daily Continue other blood pressure med  I sent doxycycline  100mg  twice daily to take until we get the tick results back   Remember your nurse visit virtually is scheduled with Izetta, Lead CMA 03/05/2024 @ 10:00 am. You will receive a text message to join the video before your appointment time.   Be Involved in Caring For Your Health:  Taking Medications When medications are taken as directed, they can greatly improve your health. But if they are not taken as prescribed, they may not work. In some cases, not taking them correctly can be harmful. To help ensure your treatment remains effective and safe, understand your medications and how to take them. Bring your medications to each visit for review by your provider.  Your lab results, notes, and after visit summary will be available on My Chart. We strongly encourage you to use this feature. If lab results are abnormal the clinic will contact you with the appropriate steps. If the clinic does not contact you assume the results are satisfactory. You can always view your results on My Chart. If you have questions regarding your health or results, please contact the clinic during office hours. You can also ask questions on My Chart.  We at [facility] are grateful that you chose us  to provide your care. We strive to provide evidence-based and compassionate care and are always looking for feedback. If you get a survey from the clinic please complete this so we can hear your opinions.

## 2024-03-04 LAB — BASIC METABOLIC PANEL WITH GFR
BUN/Creatinine Ratio: 21 (ref 12–28)
BUN: 21 mg/dL (ref 8–27)
CO2: 24 mmol/L (ref 20–29)
Calcium: 9.4 mg/dL (ref 8.7–10.3)
Chloride: 103 mmol/L (ref 96–106)
Creatinine, Ser: 0.99 mg/dL (ref 0.57–1.00)
Glucose: 77 mg/dL (ref 70–99)
Potassium: 4 mmol/L (ref 3.5–5.2)
Sodium: 142 mmol/L (ref 134–144)
eGFR: 58 mL/min/1.73 — ABNORMAL LOW (ref >59)

## 2024-03-04 LAB — SPOTTED FEVER GROUP ANTIBODIES
Spotted Fever Group IgG: <1:64 {titer}
Spotted Fever Group IgM: <1:64 {titer}

## 2024-03-04 LAB — LYME DISEASE, WESTERN BLOT
IgG P18 Ab.: ABSENT
IgG P23 Ab.: ABSENT
IgG P28 Ab.: ABSENT
IgG P30 Ab.: ABSENT
IgG P39 Ab.: ABSENT
IgG P41 Ab.: ABSENT
IgG P45 Ab.: ABSENT
IgG P58 Ab.: ABSENT
IgG P66 Ab.: ABSENT
IgG P93 Ab.: ABSENT
IgM P23 Ab.: ABSENT
IgM P41 Ab.: ABSENT
Lyme IgG Wb: NEGATIVE
Lyme IgM Wb: NEGATIVE

## 2024-03-05 ENCOUNTER — Ambulatory Visit

## 2024-03-06 ENCOUNTER — Encounter

## 2024-03-06 ENCOUNTER — Ambulatory Visit (INDEPENDENT_AMBULATORY_CARE_PROVIDER_SITE_OTHER): Admitting: Pediatrics

## 2024-03-06 ENCOUNTER — Encounter: Payer: Self-pay | Admitting: Pediatrics

## 2024-03-06 VITALS — BP 139/70 | HR 76 | Temp 98.2°F | Resp 15 | Ht 60.98 in | Wt 149.4 lb

## 2024-03-06 DIAGNOSIS — B027 Disseminated zoster: Secondary | ICD-10-CM | POA: Diagnosis not present

## 2024-03-06 DIAGNOSIS — Z0000 Encounter for general adult medical examination without abnormal findings: Secondary | ICD-10-CM | POA: Diagnosis not present

## 2024-03-06 MED ORDER — VALACYCLOVIR HCL 1 G PO TABS: 1000.0000 mg | ORAL_TABLET | Freq: Three times a day (TID) | ORAL | 0 refills | Status: AC

## 2024-03-06 MED ORDER — VALACYCLOVIR HCL 1 G PO TABS: 1000.0000 mg | ORAL_TABLET | Freq: Two times a day (BID) | ORAL | 0 refills | Status: DC

## 2024-03-06 NOTE — Patient Instructions (Signed)
 Stop doxycyline  Start valcyclovir 1000mg  three times a day for 10 days

## 2024-03-06 NOTE — Progress Notes (Signed)
 Subjective:   Amanda Cruz is a 78 y.o. female who presents for Medicare Annual (Subsequent) preventive examination.  Visit Complete: In person  Acute problem: New rash through out multiple areas, vesicles, worsening Works in garden Not painful, just itching On doxy with worsening rash  Patient Medicare AWV questionnaire was completed by the patient on 03/25/24 ; I have confirmed that all information answered by patient is correct and no changes since this date.  Cardiac Risk Factors include: advanced age (>24men, >2 women);hypertension;family history of premature cardiovascular disease     Objective:    Today's Vitals   03/06/24 1603  BP: 139/70  Pulse: 76  Resp: 15  Temp: 98.2 F (36.8 C)  TempSrc: Oral  SpO2: 97%  Weight: 149 lb 6.4 oz (67.8 kg)  Height: 5' 0.98 (1.549 m)  PainSc: 10-Worst pain ever   Body mass index is 28.24 kg/m.  Physical Exam Constitutional:      Appearance: Normal appearance.  Pulmonary:     Effort: Pulmonary effort is normal.  Musculoskeletal:        General: Normal range of motion.  Skin:    Findings: Rash present.     Comments: Lower extremity and bilateral upper extremity mix of erythematous papules and a few small vesicular lesions  Neurological:     General: No focal deficit present.     Mental Status: She is alert. Mental status is at baseline.  Psychiatric:        Mood and Affect: Mood normal.        Behavior: Behavior normal.        Thought Content: Thought content normal.         03/06/2024    4:10 PM 11/07/2023   12:00 PM 07/27/2022    2:50 PM 05/28/2021   10:07 AM 05/11/2021    6:54 AM 05/08/2021    1:11 PM 02/05/2020    8:15 AM  Advanced Directives  Does Patient Have a Medical Advance Directive? No No No No No No No  Would patient like information on creating a medical advance directive? Yes (MAU/Ambulatory/Procedural Areas - Information given) No - Patient declined No - Patient declined No - Patient declined No  - Patient declined      Current Medications (verified) Outpatient Encounter Medications as of 03/06/2024  Medication Sig   albuterol  (VENTOLIN  HFA) 108 (90 Base) MCG/ACT inhaler Inhale 2 puffs into the lungs every 6 (six) hours as needed for wheezing or shortness of breath.   atorvastatin  (LIPITOR ) 40 MG tablet Take 1 tablet by mouth once daily   Biotin w/ Vitamins C & E (HAIR/SKIN/NAILS PO) Take 1 tablet by mouth daily.   Cholecalciferol  (VITAMIN D3) 25 MCG (1000 UT) CAPS Take 1,000 Units by mouth daily.   fluticasone -salmeterol (ADVAIR) 100-50 MCG/ACT AEPB Inhale 1 puff into the lungs 2 (two) times daily.   Glucosamine 500 MG CAPS Take by mouth.   metoprolol  succinate (TOPROL -XL) 100 MG 24 hr tablet Take 1 tablet (100 mg total) by mouth daily. Take with or immediately following a meal.   olmesartan -hydrochlorothiazide  (BENICAR  HCT) 40-25 MG tablet Take 1 tablet by mouth daily.   oxybutynin  (DITROPAN -XL) 5 MG 24 hr tablet Take 1 tablet (5 mg total) by mouth at bedtime.   spironolactone  (ALDACTONE ) 100 MG tablet Take 1 tablet (100 mg total) by mouth daily.   triamcinolone  (KENALOG ) 0.025 % ointment Apply 1 Application topically 2 (two) times daily. Do not use more than 14 days at a time   [  EXPIRED] valACYclovir  (VALTREX ) 1000 MG tablet Take 1 tablet (1,000 mg total) by mouth 3 (three) times daily for 10 days.   [DISCONTINUED] valACYclovir  (VALTREX ) 1000 MG tablet Take 1 tablet (1,000 mg total) by mouth 2 (two) times daily for 10 days.   [DISCONTINUED] doxycycline  (VIBRA -TABS) 100 MG tablet Take 1 tablet (100 mg total) by mouth 2 (two) times daily for 10 days.   No facility-administered encounter medications on file as of 03/06/2024.    Allergies (verified) Patient has no known allergies.   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 coli septicemia (HCC) 11/08/2023   GERD  (gastroesophageal reflux disease)    Hypertension    Septic shock (HCC) 11/07/2023   Wears dentures    full upper and lower   Past Surgical History:  Procedure Laterality Date   ANTERIOR AND POSTERIOR REPAIR N/A 05/11/2021   Procedure: ANTERIOR (CYSTOCELE) AND POSTERIOR REPAIR (RECTOCELE);  Surgeon: Janit Alm Agent, MD;  Location: [facility];  Service: Gynecology;  Laterality: N/A;   CATARACT EXTRACTION W/PHACO Left 08/22/2019   Procedure: CATARACT EXTRACTION PHACO AND INTRAOCULAR LENS PLACEMENT (IOC) LEFT  9.98  01:10.4  14.2%;  Surgeon: Mittie Gaskin, MD;  Location: [facility];  Service: Ophthalmology;  Laterality: Left;   CATARACT EXTRACTION W/PHACO Right 09/19/2019   Procedure: CATARACT EXTRACTION PHACO AND INTRAOCULAR LENS PLACEMENT (IOC) RIGHT PANOPTIX LENS 7.44  00:52.4  14.3%;  Surgeon: Mittie Gaskin, MD;  Location: [facility];  Service: Ophthalmology;  Laterality: Right;   COLONOSCOPY WITH PROPOFOL  N/A 05/09/2018   Procedure: COLONOSCOPY WITH PROPOFOL ;  Surgeon: Jinny Carmine, MD;  Location: [facility];  Service: Endoscopy;  Laterality: N/A;   ESOPHAGOGASTRODUODENOSCOPY (EGD) WITH PROPOFOL  N/A 02/05/2020   Procedure: ESOPHAGOGASTRODUODENOSCOPY (EGD) WITH PROPOFOL ;  Surgeon: Jinny Carmine, MD;  Location: [facility];  Service: Endoscopy;  Laterality: N/A;   EYE SURGERY     FOOT SURGERY Bilateral    KNEE ARTHROSCOPY Right    LEFT HEART CATH AND CORONARY ANGIOGRAPHY N/A 11/10/2023   Procedure: LEFT HEART CATH AND CORONARY ANGIOGRAPHY;  Surgeon: Mady Bruckner, MD;  Location: [facility];  Service: Cardiovascular;  Laterality: N/A;   PUBOVAGINAL SLING N/A 05/11/2021   Procedure: PUBO-VAGINAL SLING (TOT);  Surgeon: Janit Alm Agent, MD;  Location: [facility];  Service: Gynecology;  Laterality: N/A;   TEAR DUCT PROBING  03/10/2015   TUBAL LIGATION     VAGINAL HYSTERECTOMY N/A 05/11/2021   Procedure: HYSTERECTOMY VAGINAL;  Surgeon: Janit Alm Agent, MD;  Location: [facility];  Service: Gynecology;  Laterality: N/A;   Family History  Problem Relation Age of Onset   Stroke Mother    Heart failure Mother    Thyroid  disease Paternal Grandfather    Social History   Socioeconomic History   Marital status: Married    Spouse name: Not on file   Number of children: Not on file   Years of education: Not on file   Highest education level: Associate degree: academic program  Occupational History   Not on file  Tobacco Use   Smoking status: Never   Smokeless tobacco: Never  Vaping Use   Vaping status: Never Used  Substance and Sexual Activity   Alcohol  use: Not Currently    Alcohol /week: 1.0 standard drink of alcohol     Types: 1 Glasses of wine per week  Drug use: No   Sexual activity: Not Currently    Birth control/protection: Post-menopausal, Surgical    Comment: hysterectomy  Other Topics Concern   Not on file  Social History Narrative   Not on file   Social Drivers of Health   Financial Resource Strain: Medium Risk (01/10/2024)   Overall Financial Resource Strain (CARDIA)    Difficulty of Paying Living Expenses: Somewhat hard  Food Insecurity: Food Insecurity Present (01/10/2024)   Hunger Vital Sign    Worried About Running Out of Food in the Last Year: Often true    Ran Out of Food in the Last Year: Often true  Transportation Needs: No Transportation Needs (01/10/2024)   PRAPARE - Administrator, Civil Service (Medical): No    Lack of Transportation (Non-Medical): No  Physical Activity: Insufficiently Active (01/10/2024)   Exercise Vital Sign    Days of Exercise per Week: 7 days    Minutes of Exercise per Session: 10 min  Stress: Stress Concern Present (01/10/2024)   [employer] of Occupational Health - Occupational Stress Questionnaire    Feeling of Stress : Very much  Social Connections: Socially Integrated (01/10/2024)   Social Connection and Isolation Panel    Frequency of  Communication with Friends and Family: More than three times a week    Frequency of Social Gatherings with Friends and Family: Once a week    Attends Religious Services: More than 4 times per year    Active Member of [employer] or Organizations: Yes    Attends Engineer, structural: More than 4 times per year    Marital Status: Married    Tobacco Counseling Counseling given: Not Answered   Clinical Intake:     Pain : 0-10 Pain Score: 10-Worst pain ever (Relates rash itch to pain scale) Pain Type: Acute pain Pain Location: Ankle Pain Orientation: Left Pain Onset: In the past 7 days Pain Frequency: Constant        How often do you need to have someone help you when you read instructions, pamphlets, or other written materials from your doctor or pharmacy?: 1 - Never  Interpreter Needed?: No      Activities of Daily Living    03/06/2024    4:07 PM 11/07/2023   12:00 PM  In your present state of health, do you have any difficulty performing the following activities:  Hearing? 0 0  Vision? 0 0  Difficulty concentrating or making decisions? 0 0  Walking or climbing stairs? 1   Comment Age related   Dressing or bathing? 0   Doing errands, shopping? 0 0  Preparing Food and eating ? N   Using the Toilet? N   In the past six months, have you accidently leaked urine? Y   Do you have problems with loss of bowel control? N   Managing your Medications? N   Managing your Finances? N   Housekeeping or managing your Housekeeping? N     Patient Care Team: Herold Hadassah SQUIBB, MD as PCP - General (Family Medicine)  Indicate any recent Medical Services you may have received from other than [facility] providers in the past year (date may be approximate).     Assessment:   This is a routine wellness examination for Amanda Cruz.  Hearing/Vision screen No results found.   Goals Addressed   None    Depression Screen    03/13/2024   11:19 AM 03/06/2024    4:12 PM 02/29/2024    2:43 PM  02/17/2024    1:14 PM 02/02/2024   10:41 AM 07/27/2022    2:48 PM 05/04/2022    2:45 PM  PHQ 2/9 Scores  PHQ - 2 Score 0 0 0 0 1 0 0  PHQ- 9 Score 0  0 0 3 0     Fall Risk    03/13/2024   11:19 AM 03/06/2024    4:11 PM 02/29/2024    2:41 PM 02/17/2024    1:13 PM 02/02/2024   10:41 AM  Fall Risk   Falls in the past year? 0 0 0 0 1  Number falls in past yr: 0 0 0 0   Injury with Fall? 0 0 0 0 0  Risk for fall due to : No Fall Risks No Fall Risks No Fall Risks No Fall Risks   Follow up Falls evaluation completed Falls evaluation completed Falls evaluation completed Falls evaluation completed     MEDICARE RISK AT HOME: Medicare Risk at Home Any stairs in or around the home?: Yes If so, are there any without handrails?: Yes Home free of loose throw rugs in walkways, pet beds, electrical cords, etc?: No Adequate lighting in your home to reduce risk of falls?: No Life alert?: No Use of a cane, walker or w/c?: No Grab bars in the bathroom?: Yes Shower chair or bench in shower?: Yes Elevated toilet seat or a handicapped toilet?: Yes  TIMED UP AND GO:  Was the test performed?  Yes  Length of time to ambulate 10 feet: 8 sec Gait slow and steady without use of assistive device    Cognitive Function:    05/28/2021   10:11 AM  MMSE - Mini Mental State Exam  Orientation to time 5  Orientation to Place 5  Registration 3  Attention/ Calculation 5  Recall 3  Language- name 2 objects 2  Language- repeat 1  Language- follow 3 step command 3  Language- read & follow direction 1  Write a sentence 0  Copy design 0  Total score 28        03/06/2024    4:12 PM 07/27/2022    2:53 PM 05/28/2021   10:12 AM  6CIT Screen  What Year? 0 points 0 points 0 points  What month? 0 points 0 points 0 points  What time? 0 points 0 points 0 points  Count back from 20 0 points 0 points 0 points  Months in reverse 0 points 0 points 0 points  Repeat phrase 0 points 0 points 0 points  Total Score 0  points 0 points 0 points    Immunizations Immunization History  Administered Date(s) Administered   Fluad Quad(high Dose 65+) 07/01/2020, 06/24/2021, 06/01/2022   Hep B, Unspecified 07/17/1996, 08/17/1996, 01/31/1997   Influenza, High Dose Seasonal PF 08/02/2018   Influenza-Unspecified 06/25/2019   Moderna Covid-19 Fall Seasonal Vaccine 53yrs & older 08/06/2022   PFIZER(Purple Top)SARS-COV-2 Vaccination 10/24/2019, 11/14/2019, 09/02/2020, 07/06/2021   Pneumococcal Conjugate Pcv21, Polysaccharide Crm197 Conjugaf 01/24/2024   Pneumococcal Conjugate-13 07/30/2014   Pneumococcal Polysaccharide-23 05/25/2016   Respiratory Syncytial Virus Vaccine,Recomb Aduvanted(Arexvy) 09/27/2023   Td 01/31/1997   Tdap 10/22/2010, 08/22/2011, 01/24/2024   Zoster Recombinant(Shingrix) 07/18/2023, 09/17/2023    TDAP status: Up to date  Flu Vaccine status: Up to date  Pneumococcal vaccine status: Up to date  Covid-19 vaccine status: Completed vaccines  Qualifies for Shingles Vaccine? Yes   Zostavax completed No   Shingrix Completed?: Yes  Screening Tests Health Maintenance  Topic Date Due  Colonoscopy  05/10/2023   Medicare Annual Wellness (AWV)  07/28/2023   INFLUENZA VACCINE  03/30/2024   Pneumococcal Vaccine: 50+ Years  Completed   DEXA SCAN  Completed   Hepatitis C Screening  Completed   Zoster Vaccines- Shingrix  Completed   Hepatitis B Vaccines  Aged Out   HPV VACCINES  Aged Out   Meningococcal B Vaccine  Aged Out   DTaP/Tdap/Td  Discontinued   COVID-19 Vaccine  Discontinued    Health Maintenance  Health Maintenance Due  Topic Date Due   Colonoscopy  05/10/2023   Medicare Annual Wellness (AWV)  07/28/2023    Colorectal cancer screening: No longer required.   Mammogram status: No longer required due to age.  Bone Density status: Completed . Results reflect: Bone density results: NORMAL.   Additional Screening:  Hepatitis C Screening: does qualify; Completed  02/16/2022  Vision Screening: Recommended annual ophthalmology exams for early detection of glaucoma and other disorders of the eye. Is the patient up to date with their annual eye exam?  Yes    Dental Screening: Recommended annual dental exams for proper oral hygiene  Community Resource Referral / Chronic Care Management: CRR required this visit?  No   CCM required this visit?  No     Plan:     I have personally reviewed and noted the following in the patient's chart:   Medical and social history Use of alcohol , tobacco or illicit drugs  Current medications and supplements including opioid prescriptions. Patient is not currently taking opioid prescriptions. Functional ability and status Nutritional status Physical activity Advanced directives List of other physicians Hospitalizations, surgeries, and ER visits in previous 12 months Vitals Screenings to include cognitive, depression, and falls Referrals and appointments  In addition, I have reviewed and discussed with patient certain preventive protocols, quality metrics, and best practice recommendations. A written personalized care plan for preventive services as well as general preventive health recommendations were provided to patient.    Amanda Cruz was seen today for rash and medicare wellness.  Diagnoses and all orders for this visit:  Disseminated herpes zoster New rash c/f disseminated zoster though I am not entirely sure. Recently given doxy for presumed cellulitis and/or tick bite though ahs worsened. Will arrange close follow up and start valtrex . Consider contact dermatitis related rashes given out in garden as main exposure. Stop doxy. -     Discontinue: valACYclovir  (VALTREX ) 1000 MG tablet; Take 1 tablet (1,000 mg total) by mouth 2 (two) times daily for 10 days. -     valACYclovir  (VALTREX ) 1000 MG tablet; Take 1 tablet (1,000 mg total) by mouth 3 (three) times daily for 10 days.     Hadassah SHAUNNA Nett,  MD   03/25/2024   After Visit Summary: (In Person-Printed) AVS printed and given to the patient

## 2024-03-07 ENCOUNTER — Other Ambulatory Visit: Payer: Self-pay | Admitting: Pediatrics

## 2024-03-07 ENCOUNTER — Encounter: Payer: Self-pay | Admitting: Pediatrics

## 2024-03-07 DIAGNOSIS — R21 Rash and other nonspecific skin eruption: Secondary | ICD-10-CM

## 2024-03-07 MED ORDER — PREDNISONE 20 MG PO TABS: 40.0000 mg | ORAL_TABLET | Freq: Every day | ORAL | 0 refills | Status: AC

## 2024-03-07 NOTE — Assessment & Plan Note (Signed)
 Blood pressure slightly elevated despite spironolactone  increase. Current regimen not achieving target goal. - Increase spironolactone  to 100 mg daily. - Continue metoprolol  100 mg daily and benicar /hydrochlorothiazide  40-25mg 

## 2024-03-07 NOTE — Progress Notes (Signed)
 This encounter was created in error - please disregard.

## 2024-03-08 ENCOUNTER — Ambulatory Visit: Admitting: Pediatrics

## 2024-03-08 ENCOUNTER — Ambulatory Visit: Payer: Self-pay | Admitting: Pediatrics

## 2024-03-09 ENCOUNTER — Encounter: Payer: Self-pay | Admitting: Nurse Practitioner

## 2024-03-09 ENCOUNTER — Ambulatory Visit (INDEPENDENT_AMBULATORY_CARE_PROVIDER_SITE_OTHER): Admitting: Nurse Practitioner

## 2024-03-09 VITALS — BP 152/66 | HR 96 | Temp 97.6°F | Ht 61.0 in | Wt 149.0 lb

## 2024-03-09 DIAGNOSIS — B027 Disseminated zoster: Secondary | ICD-10-CM | POA: Diagnosis not present

## 2024-03-09 NOTE — Progress Notes (Signed)
 BP (!) 152/66   Pulse 96   Temp 97.6 F (36.4 C) (Oral)   Ht 5' 1 (1.549 m)   Wt 149 lb (67.6 kg)   SpO2 97%   BMI 28.15 kg/m    Subjective:    Patient ID: Amanda Cruz, female    DOB: Jan 22, 1946, 78 y.o.   MRN: [id number]  HPI: Amanda Cruz is a 78 y.o. female  Chief Complaint  Patient presents with   Rash    On arms, legs, and back. Painful. Possible Shingles    Patient seen today to follow up on a rash.  Rash is on left form appearing in vesicular, red, raised rash.  Also on left abdomen, and right heel.  Rash is itchy and painful at times.  However, it does appear to be improving.  Patient is taking both acyclovir and prednisone .     Relevant past medical, surgical, family and social history reviewed and updated as indicated. Interim medical history since our last visit reviewed. Allergies and medications reviewed and updated.  Review of Systems  Skin:  Positive for rash.    Per HPI unless specifically indicated above     Objective:    BP (!) 152/66   Pulse 96   Temp 97.6 F (36.4 C) (Oral)   Ht 5' 1 (1.549 m)   Wt 149 lb (67.6 kg)   SpO2 97%   BMI 28.15 kg/m   Wt Readings from Last 3 Encounters:  03/09/24 149 lb (67.6 kg)  03/06/24 149 lb 6.4 oz (67.8 kg)  02/17/24 150 lb 12.8 oz (68.4 kg)    Physical Exam Vitals and nursing note reviewed.  Constitutional:      General: She is not in acute distress.    Appearance: Normal appearance. She is normal weight. She is not ill-appearing, toxic-appearing or diaphoretic.  HENT:     Head: Normocephalic.     Right Ear: External ear normal.     Left Ear: External ear normal.     Nose: Nose normal.     Mouth/Throat:     Mouth: Mucous membranes are moist.     Pharynx: Oropharynx is clear.  Eyes:     General:        Right eye: No discharge.        Left eye: No discharge.     Extraocular Movements: Extraocular movements intact.     Conjunctiva/sclera: Conjunctivae normal.     Pupils: Pupils are  equal, round, and reactive to light.  Cardiovascular:     Rate and Rhythm: Normal rate and regular rhythm.     Heart sounds: No murmur heard. Pulmonary:     Effort: Pulmonary effort is normal. No respiratory distress.     Breath sounds: Normal breath sounds. No wheezing or rales.  Musculoskeletal:     Cervical back: Normal range of motion and neck supple.  Skin:    General: Skin is warm and dry.     Capillary Refill: Capillary refill takes less than 2 seconds.      Neurological:     General: No focal deficit present.     Mental Status: She is alert and oriented to person, place, and time. Mental status is at baseline.  Psychiatric:        Mood and Affect: Mood normal.        Behavior: Behavior normal.        Thought Content: Thought content normal.        Judgment: Judgment  normal.     Results for orders placed or performed in visit on 02/29/24  Basic Metabolic Panel (BMET)   Collection Time: 02/29/24  3:21 PM  Result Value Ref Range   Glucose 77 70 - 99 mg/dL   BUN 21 8 - 27 mg/dL   Creatinine, Ser 9.00 0.57 - 1.00 mg/dL   eGFR 58 (L) >40 fO/fpw/8.26   BUN/Creatinine Ratio 21 12 - 28   Sodium 142 134 - 144 mmol/L   Potassium 4.0 3.5 - 5.2 mmol/L   Chloride 103 96 - 106 mmol/L   CO2 24 20 - 29 mmol/L   Calcium  9.4 8.7 - 10.3 mg/dL  Spotted Fever Group Antibodies   Collection Time: 02/29/24  3:21 PM  Result Value Ref Range   Spotted Fever Group IgG <1:64 Neg:<1:64   Spotted Fever Group IgM <1:64 Neg:<1:64   Result Comment Comment   Lyme disease, western blot   Collection Time: 02/29/24  3:21 PM  Result Value Ref Range   Lyme IgG Wb Negative Negative     IgG P93 Ab. Absent      IgG P66 Ab. Absent      IgG P58 Ab. Absent      IgG P45 Ab. Absent      IgG P41 Ab. Absent      IgG P39 Ab. Absent      IgG P30 Ab. Absent      IgG P28 Ab. Absent      IgG P23 Ab. Absent      IgG P18 Ab. Absent    Lyme IgM Wb Negative Negative     IgM P41 Ab. Absent      IgM P39 Ab.  Present      IgM P23 Ab. Absent    Additional Information Comment       Assessment & Plan:   Problem List Items Addressed This Visit   None Visit Diagnoses       Disseminated herpes zoster    -  Primary   Improving from last visit. She is taking prednisone  and Valacyclovir - continue with both medications. Follow up in 3 days to ensure it is improving. Discussed        Follow up plan: Return in about 3 days (around 03/12/2024) for 1120 okay to double book.

## 2024-03-13 ENCOUNTER — Telehealth: Payer: Self-pay | Admitting: Pediatrics

## 2024-03-13 ENCOUNTER — Ambulatory Visit (INDEPENDENT_AMBULATORY_CARE_PROVIDER_SITE_OTHER): Admitting: Nurse Practitioner

## 2024-03-13 ENCOUNTER — Encounter: Payer: Self-pay | Admitting: Nurse Practitioner

## 2024-03-13 VITALS — BP 166/76 | HR 66 | Temp 97.7°F | Wt 148.6 lb

## 2024-03-13 DIAGNOSIS — R21 Rash and other nonspecific skin eruption: Secondary | ICD-10-CM | POA: Diagnosis not present

## 2024-03-13 NOTE — Progress Notes (Signed)
 BP (!) 166/76   Pulse 66   Temp 97.7 F (36.5 C) (Oral)   Wt 148 lb 9.6 oz (67.4 kg)   SpO2 97%   BMI 28.08 kg/m    Subjective:    Patient ID: Amanda Cruz, female    DOB: 07-16-1946, 78 y.o.   MRN: [id number]  HPI: Amanda Cruz is a 78 y.o. female  Chief Complaint  Patient presents with   Rash    Pt states that it is healing well and no pain   Patient seen today to follow up on a rash.  Rash has scabbed over.  She is not having any pain.  Swelling in lower extremities is decreased. Does have some slight itching.    Relevant past medical, surgical, family and social history reviewed and updated as indicated. Interim medical history since our last visit reviewed. Allergies and medications reviewed and updated.  Review of Systems  Skin:  Positive for rash.    Per HPI unless specifically indicated above     Objective:    BP (!) 166/76   Pulse 66   Temp 97.7 F (36.5 C) (Oral)   Wt 148 lb 9.6 oz (67.4 kg)   SpO2 97%   BMI 28.08 kg/m   Wt Readings from Last 3 Encounters:  03/13/24 148 lb 9.6 oz (67.4 kg)  03/09/24 149 lb (67.6 kg)  03/06/24 149 lb 6.4 oz (67.8 kg)    Physical Exam Vitals and nursing note reviewed.  Constitutional:      General: She is not in acute distress.    Appearance: Normal appearance. She is normal weight. She is not ill-appearing, toxic-appearing or diaphoretic.  HENT:     Head: Normocephalic.     Right Ear: External ear normal.     Left Ear: External ear normal.     Nose: Nose normal.     Mouth/Throat:     Mouth: Mucous membranes are moist.     Pharynx: Oropharynx is clear.  Eyes:     General:        Right eye: No discharge.        Left eye: No discharge.     Extraocular Movements: Extraocular movements intact.     Conjunctiva/sclera: Conjunctivae normal.     Pupils: Pupils are equal, round, and reactive to light.  Cardiovascular:     Rate and Rhythm: Normal rate and regular rhythm.     Heart sounds: No murmur  heard. Pulmonary:     Effort: Pulmonary effort is normal. No respiratory distress.     Breath sounds: Normal breath sounds. No wheezing or rales.  Musculoskeletal:     Cervical back: Normal range of motion and neck supple.  Skin:    General: Skin is warm and dry.     Capillary Refill: Capillary refill takes less than 2 seconds.     Comments: Rash has scabbed over.   Neurological:     General: No focal deficit present.     Mental Status: She is alert and oriented to person, place, and time. Mental status is at baseline.  Psychiatric:        Mood and Affect: Mood normal.        Behavior: Behavior normal.        Thought Content: Thought content normal.        Judgment: Judgment normal.     Results for orders placed or performed in visit on 02/29/24  Basic Metabolic Panel (BMET)   Collection Time:  02/29/24  3:21 PM  Result Value Ref Range   Glucose 77 70 - 99 mg/dL   BUN 21 8 - 27 mg/dL   Creatinine, Ser 9.00 0.57 - 1.00 mg/dL   eGFR 58 (L) >40 fO/fpw/8.26   BUN/Creatinine Ratio 21 12 - 28   Sodium 142 134 - 144 mmol/L   Potassium 4.0 3.5 - 5.2 mmol/L   Chloride 103 96 - 106 mmol/L   CO2 24 20 - 29 mmol/L   Calcium  9.4 8.7 - 10.3 mg/dL  Spotted Fever Group Antibodies   Collection Time: 02/29/24  3:21 PM  Result Value Ref Range   Spotted Fever Group IgG <1:64 Neg:<1:64   Spotted Fever Group IgM <1:64 Neg:<1:64   Result Comment Comment   Lyme disease, western blot   Collection Time: 02/29/24  3:21 PM  Result Value Ref Range   Lyme IgG Wb Negative Negative     IgG P93 Ab. Absent      IgG P66 Ab. Absent      IgG P58 Ab. Absent      IgG P45 Ab. Absent      IgG P41 Ab. Absent      IgG P39 Ab. Absent      IgG P30 Ab. Absent      IgG P28 Ab. Absent      IgG P23 Ab. Absent      IgG P18 Ab. Absent    Lyme IgM Wb Negative Negative     IgM P41 Ab. Absent      IgM P39 Ab. Present      IgM P23 Ab. Absent    Additional Information Comment       Assessment & Plan:    Problem List Items Addressed This Visit   None Visit Diagnoses       Rash    -  Primary   Resolving.  okay to use triamcinalone as needed for symptoms.  Follow up if not improved.         Follow up plan: No follow-ups on file.

## 2024-03-13 NOTE — Telephone Encounter (Signed)
 Copied from CRM [id number]. Topic: Medicare AWV >> Mar 13, 2024  2:01 PM Nathanel DEL wrote: Reason for CRM: Called LVM 03/13/2024 to schedule AWV. Please schedule Virtual or Telehealth visits ONLY.   Nathanel Paschal; Care Guide Ambulatory Clinical Support [place] [facility] Direct Dial: [phone]

## 2024-03-21 ENCOUNTER — Telehealth: Payer: Self-pay

## 2024-03-21 NOTE — Telephone Encounter (Signed)
 Contacted patient to provide patient with info below, patient informed me that she has had a new rash occur since yesterday different from the one she previously had , offered patient a appointment tomorrow with provider patient is unavailable so scheduled with provider for Monday

## 2024-03-21 NOTE — Telephone Encounter (Signed)
 Copied from CRM [id number]. Topic: Clinical - Medication Question >> Mar 21, 2024 10:34 AM Turkey B wrote: Reason for CRM: pt alled in wants to know if she should still take , valACYclovir  (VALTREX ) 1000 MG tablet

## 2024-03-25 ENCOUNTER — Encounter: Payer: Self-pay | Admitting: Pediatrics

## 2024-03-26 ENCOUNTER — Encounter: Payer: Self-pay | Admitting: Pediatrics

## 2024-03-26 ENCOUNTER — Ambulatory Visit (INDEPENDENT_AMBULATORY_CARE_PROVIDER_SITE_OTHER): Admitting: Pediatrics

## 2024-03-26 DIAGNOSIS — R21 Rash and other nonspecific skin eruption: Secondary | ICD-10-CM

## 2024-03-26 DIAGNOSIS — N3281 Overactive bladder: Secondary | ICD-10-CM

## 2024-03-26 MED ORDER — TRIAMCINOLONE ACETONIDE 0.025 % EX OINT: 1.0000 | TOPICAL_OINTMENT | Freq: Two times a day (BID) | CUTANEOUS | 0 refills | Status: AC

## 2024-03-26 NOTE — Progress Notes (Signed)
 Office Visit  BP 113/61   Pulse (!) 57   Temp 97.9 F (36.6 C) (Oral)   Wt 148 lb 9.6 oz (67.4 kg)   SpO2 96%   BMI 28.08 kg/m    Subjective:    Patient ID: Amanda Cruz, female    DOB: [birth year], 78 y.o.   MRN: [id number]  HPI: Amanda Cruz is a 78 y.o. female  Chief Complaint  Patient presents with   Rash    Discussed the use of AI scribe software for clinical note transcription with the patient, who gave verbal consent to proceed.  History of Present Illness   Amanda Cruz is a 78 year old female who presents with a rash.  She has a rash that was very red last Wednesday and is now improving. The rash is located on her back and front and has become less red over time. She has been using a steroid cream, triamcinolone , which is almost finished, and was also on Valtrex , which she did not request to be refilled. She did not request a refill of Valtrex  and was unsure why it was refilled by the pharmacy. The rash is itchy, with some areas remaining itchy even when not red.  She has not been spending time outside except for watering her yard and does not use sunscreen. She wonders if her skin sensitivity is increasing.  She is currently taking oxybutynin  for urinary symptoms, which she takes at bedtime. She goes to the bathroom three times at night and notes that the medication helps a little. She is concerned about potential side effects, particularly regarding her eyesight, but mentions that her vision seems to be improving.      Relevant past medical, surgical, family and social history reviewed and updated as indicated. Interim medical history since our last visit reviewed. Allergies and medications reviewed and updated.  ROS per HPI unless specifically indicated above     Objective:    BP 113/61   Pulse (!) 57   Temp 97.9 F (36.6 C) (Oral)   Wt 148 lb 9.6 oz (67.4 kg)   SpO2 96%   BMI 28.08 kg/m   Wt Readings from Last 3 Encounters:  03/26/24 148 lb  9.6 oz (67.4 kg)  03/13/24 148 lb 9.6 oz (67.4 kg)  03/09/24 149 lb (67.6 kg)     Physical Exam Constitutional:      Appearance: Normal appearance.  Pulmonary:     Effort: Pulmonary effort is normal.  Musculoskeletal:        General: Normal range of motion.  Skin:    Comments: Subtle hyperpigmentation in left forearm, prior rashes improved  Neurological:     General: No focal deficit present.     Mental Status: She is alert. Mental status is at baseline.  Psychiatric:        Mood and Affect: Mood normal.        Behavior: Behavior normal.        Thought Content: Thought content normal.         03/13/2024   11:19 AM 03/06/2024    4:12 PM 02/29/2024    2:43 PM 02/17/2024    1:14 PM 02/02/2024   10:41 AM  Depression screen PHQ 2/9  Decreased Interest 0 0 0 0 0  Down, Depressed, Hopeless 0 0 0 0 1  PHQ - 2 Score 0 0 0 0 1  Altered sleeping 0  0 0 0  Tired, decreased energy 0  0 0  1  Change in appetite 0  0 0 0  Feeling bad or failure about yourself  0  0 0 1  Trouble concentrating 0  0 0 0  Moving slowly or fidgety/restless 0  0 0 0  Suicidal thoughts 0  0 0 0  PHQ-9 Score 0  0 0 3  Difficult doing work/chores Not difficult at all   Not difficult at all Not difficult at all       03/13/2024   11:19 AM 03/06/2024    4:16 PM 02/29/2024    2:43 PM 02/17/2024    1:14 PM  GAD 7 : Generalized Anxiety Score  Nervous, Anxious, on Edge 0 0 0 0  Control/stop worrying 0 0 0 0  Worry too much - different things 0 0 0 0  Trouble relaxing 0 0 0 0  Restless 0 0 0 0  Easily annoyed or irritable 0 0 0 0  Afraid - awful might happen 0 0 0 0  Total GAD 7 Score 0 0 0 0  Anxiety Difficulty Not difficult at all   Not difficult at all       Assessment & Plan:  Assessment & Plan   Rash Suspected contact dermatitis from iris plant exposure has improved with triamcinolone  cream. Herpes zoster has been ruled out. Prescribe additional triamcinolone  cream to be applied twice daily. Advise  wearing sunscreen to prevent post-inflammatory hyperpigmentation. Instruct to call if the rash does not improve in a week and keep the appointment next Thursday if needed. -     Triamcinolone  Acetonide; Apply 1 Application topically 2 (two) times daily. Do not use more than 14 days at a time  Dispense: 30 g; Refill: 0  Overactive bladder She experiences overactive bladder with nocturia and has had minimal relief from low-dose oxybutynin . She is concerned about side effects of higher doses. Continue the current dose of oxybutynin  and monitor symptoms. Discuss the option to increase the dose to 10 mg if symptoms persist, while respecting her decision to maintain the current dose due to side effect concerns.   Follow up plan: No follow-ups on file.  Hadassah SHAUNNA Nett, MD

## 2024-03-26 NOTE — Patient Instructions (Signed)
 Can cancel next appointment

## 2024-04-01 ENCOUNTER — Encounter: Payer: Self-pay | Admitting: Pediatrics

## 2024-04-03 ENCOUNTER — Other Ambulatory Visit: Payer: Self-pay | Admitting: Pediatrics

## 2024-04-03 ENCOUNTER — Ambulatory Visit: Payer: Self-pay

## 2024-04-03 ENCOUNTER — Telehealth: Payer: Self-pay | Admitting: Pediatrics

## 2024-04-03 DIAGNOSIS — R21 Rash and other nonspecific skin eruption: Secondary | ICD-10-CM

## 2024-04-03 MED ORDER — PREDNISONE 20 MG PO TABS: 40.0000 mg | ORAL_TABLET | Freq: Every day | ORAL | 0 refills | Status: AC

## 2024-04-03 NOTE — Telephone Encounter (Signed)
 See note. Pt refused triage

## 2024-04-03 NOTE — Progress Notes (Signed)
 Sent prednisone  for rash. Will evaluate in person tomorrow.  Amanda SHAUNNA Nett, MD

## 2024-04-03 NOTE — Telephone Encounter (Signed)
  When being warm transferred patient states she declines nurse triage and does not need to speak with a nurse. She states all she wants is the medication filled (prednisone ). PAS explained medication request was sent to clinic and may take 3 business days.    Copied from CRM #[id number]. Topic: Clinical - Red Word Triage >> Apr 03, 2024 11:11 AM Tiffini S wrote: [facility] that prompted transfer to Nurse Triage: Patient sent a MyCHART message to Dr. Herold Patient states that rashes that look shingles is on her left hand. She is taking the ValACYclovir  now. She need Prednisone  too. Transferred call to triage nurse. Reason for Disposition  [1] Caller demands to speak with the PCP AND [2] about sick adult (or sick caller)  Answer Assessment - Initial Assessment Questions 1. SITUATION:  Document reason for call.     Patient requesting prednisone .  2. BACKGROUND: Document any background information (e.g., prior calls, known psychiatric history)     Patient saw Dr Herold on 03/26/24 for her rash and sent a mychart message to the provider on 03/30/24.  3. ASSESSMENT: Document your nursing assessment.     Unable to assess or speak with patient due to she declined.  4. RESPONSE: Document what your response or recommendation was.     PAS advised patient that a medication request was submitted and may take up to 3 business days.  Protocols used: Difficult Call-A-AH

## 2024-04-03 NOTE — Telephone Encounter (Unsigned)
 Copied from CRM [id number]. Topic: Clinical - Medication Refill >> Apr 03, 2024 11:05 AM Tiffini S wrote: Medication: prednisone  20mg    Has the patient contacted their pharmacy? No (Agent: If no, request that the patient contact the pharmacy for the refill. If patient does not wish to contact the pharmacy document the reason why and proceed with request.) (Agent: If yes, when and what did the pharmacy advise?)  This is the patient's preferred pharmacy:  [facility] [address]), [place] - [address] [place]) [place] [address] Phone: [phone] Fax: [phone]  Is this the correct pharmacy for this prescription? Yes If no, delete pharmacy and type the correct one.   Has the prescription been filled recently? Yes  Is the patient out of the medication? Yes  Has the patient been seen for an appointment in the last year OR does the patient have an upcoming appointment? Yes  Can we respond through MyChart? No, please call patient at [phone]  Agent: Please be advised that Rx refills may take up to 3 business days. We ask that you follow-up with your pharmacy.

## 2024-04-03 NOTE — Telephone Encounter (Signed)
 Called and spoke with patient, provider verbally informed me to inform medication requested has been sent to the pharmacy also scheduled patient for a appointment in office 04/04/24

## 2024-04-04 ENCOUNTER — Ambulatory Visit (INDEPENDENT_AMBULATORY_CARE_PROVIDER_SITE_OTHER): Admitting: Pediatrics

## 2024-04-04 ENCOUNTER — Ambulatory Visit: Admitting: Pediatrics

## 2024-04-04 ENCOUNTER — Other Ambulatory Visit (HOSPITAL_COMMUNITY)
Admission: RE | Admit: 2024-04-04 | Discharge: 2024-04-04 | Disposition: A | Discharge status: Home / Self Care | Source: Ambulatory Visit | Attending: Pediatrics | Admitting: Pediatrics

## 2024-04-04 VITALS — BP 134/62 | HR 77 | Temp 98.7°F | Wt 149.2 lb

## 2024-04-04 DIAGNOSIS — R21 Rash and other nonspecific skin eruption: Secondary | ICD-10-CM | POA: Diagnosis present

## 2024-04-04 NOTE — Patient Instructions (Signed)
 Stop valtrex  Continue prednisone   Use steroid cream if itchy before our visit

## 2024-04-04 NOTE — Progress Notes (Signed)
 Office Visit  BP 134/62   Pulse 77   Temp 98.7 F (37.1 C) (Oral)   Wt 149 lb 3.2 oz (67.7 kg)   SpO2 98%   BMI 28.19 kg/m    Subjective:    Patient ID: Amanda Cruz, female    DOB: 1945-11-05, 78 y.o.   MRN: [id number]  HPI: Amanda Cruz is a 78 y.o. female  Chief Complaint  Patient presents with   Rash   #rash Patient with ongoing rash Itchy She briefly restarted valtrex  and now on pred She does not have any other rashes Does have a few marking on her abdomen Not painful  No other recent exposures, bug bites  Relevant past medical, surgical, family and social history reviewed and updated as indicated. Interim medical history since our last visit reviewed. Allergies and medications reviewed and updated.  ROS per HPI unless specifically indicated above     Objective:    BP 134/62   Pulse 77   Temp 98.7 F (37.1 C) (Oral)   Wt 149 lb 3.2 oz (67.7 kg)   SpO2 98%   BMI 28.19 kg/m   Wt Readings from Last 3 Encounters:  04/04/24 149 lb 3.2 oz (67.7 kg)  03/26/24 148 lb 9.6 oz (67.4 kg)  03/13/24 148 lb 9.6 oz (67.4 kg)     Physical Exam Constitutional:      Appearance: Normal appearance.  Pulmonary:     Effort: Pulmonary effort is normal.  Musculoskeletal:        General: Normal range of motion.  Skin:    Comments: Normal skin color  Neurological:     General: No focal deficit present.     Mental Status: She is alert. Mental status is at baseline.  Psychiatric:        Mood and Affect: Mood normal.        Behavior: Behavior normal.        Thought Content: Thought content normal.     Left arm:  Left arm:  Abdomen       04/04/2024   10:52 AM 03/13/2024   11:19 AM 03/06/2024    4:12 PM 02/29/2024    2:43 PM 02/17/2024    1:14 PM  Depression screen PHQ 2/9  Decreased Interest 0 0 0 0 0  Down, Depressed, Hopeless 0 0 0 0 0  PHQ - 2 Score 0 0 0 0 0  Altered sleeping 0 0  0 0  Tired, decreased energy 0 0  0 0  Change in appetite 0 0  0 0   Feeling bad or failure about yourself  0 0  0 0  Trouble concentrating 0 0  0 0  Moving slowly or fidgety/restless 0 0  0 0  Suicidal thoughts 0 0  0 0  PHQ-9 Score 0 0  0 0  Difficult doing work/chores Not difficult at all Not difficult at all   Not difficult at all       04/04/2024   10:52 AM 03/13/2024   11:19 AM 03/06/2024    4:16 PM 02/29/2024    2:43 PM  GAD 7 : Generalized Anxiety Score  Nervous, Anxious, on Edge 0 0 0 0  Control/stop worrying 0 0 0 0  Worry too much - different things 0 0 0 0  Trouble relaxing 0 0 0 0  Restless 0 0 0 0  Easily annoyed or irritable 0 0 0 0  Afraid - awful might happen 0 0 0  0  Total GAD 7 Score 0 0 0 0  Anxiety Difficulty Not difficult at all Not difficult at all         Assessment & Plan:  Assessment & Plan   Rash Patient with subacute rash that initially thought to be contact derm, then shingles, now resurfaced and is very itchy. Plan to continue prednisone  and get biopsy today for diagnosis. Will also place dermatology referral in the meantime.  -     Surgical pathology -     Surgical pathology -     Ambulatory referral to Dermatology -     Herpes simplex virus culture    Follow up plan: Return in about 1 week (around 04/11/2024) for suture removal.  Hadassah SHAUNNA Nett, MD   Punch Biopsy Procedure Note  Pre-operative Diagnosis: rash, undifferentiated  Universal time out performed with all team members present using double patient identifiers. yes  Patient informed of the risks (including bleeding and infection) and benefits of the  procedure and verbal informed consent obtained.  Patient had the opportunities to ask questions and they were addressed.  Anesthesia: Lidocaine  1% with epinephrine , 2 ml  Procedure Details  The skin was cleaned with alcohol  and local anesthetic was administered with adequate analgesia. The rash and surrounding area was given a sterile prep using chlorhexidine  and draped in the usual sterile fashion.   The rash was punched at two different sites using the 5 mm punch.  The resulting ellipse was then closed with 4-0 Prolene using simple interrupted stitches at one sight and figure 8 at another.  Petroleum ointment and a sterile dressing applied.  The specimen was sent for pathologic examination.  The patient tolerated the procedure well.  Complications: none.  Plan: 1. Instructed to keep the wound dry and covered for 24-48h and clean thereafter. 2. Warning signs of infection were reviewed.   3. Recommended that the patient use OTC analgesics as needed for pain.  4. Return for suture removal in 7 days.  Follow up plan: Return in about 1 week (around 04/11/2024) for suture removal.  Hadassah SHAUNNA Nett, MD

## 2024-04-05 ENCOUNTER — Ambulatory Visit: Admitting: Pediatrics

## 2024-04-05 NOTE — Telephone Encounter (Signed)
 Requested medication (s) are due for refill today:   Provider to review  Requested medication (s) are on the active medication list:   Yes  Future visit scheduled:   Yes 8/14   Last ordered: 04/03/2024 #10, 0 refills  Non delegated refill     Looks like a duplicate request   Requested Prescriptions  Pending Prescriptions Disp Refills   predniSONE  (DELTASONE ) 20 MG tablet [Pharmacy Med Name: predniSONE  20 MG Oral Tablet] 10 tablet 0    Sig: TAKE 2 TABLETS BY MOUTH ONCE DAILY WITH BREAKFAST FOR 5 DAYS     Not Delegated - Endocrinology:  Oral Corticosteroids Failed - 04/05/2024  9:37 AM      Failed - This refill cannot be delegated      Failed - Manual Review: Eye exam for IOP if prolonged treatment      Failed - Bone Mineral Density or Dexa Scan completed in the last 2 years      Passed - Glucose (serum) in normal range and within 180 days    Glucose  Date Value Ref Range Status  02/29/2024 77 70 - 99 mg/dL Final   Glucose, Bld  Date Value Ref Range Status  11/12/2023 108 (H) 70 - 99 mg/dL Final    Comment:    Glucose reference range applies only to samples taken after fasting for at least 8 hours.   Glucose-Capillary  Date Value Ref Range Status  11/07/2023 185 (H) 70 - 99 mg/dL Final    Comment:    Glucose reference range applies only to samples taken after fasting for at least 8 hours.         Passed - K in normal range and within 180 days    Potassium  Date Value Ref Range Status  02/29/2024 4.0 3.5 - 5.2 mmol/L Final         Passed - Na in normal range and within 180 days    Sodium  Date Value Ref Range Status  02/29/2024 142 134 - 144 mmol/L Final         Passed - Last BP in normal range    BP Readings from Last 1 Encounters:  04/04/24 134/62         Passed - Valid encounter within last 6 months    Recent Outpatient Visits           Yesterday Rash   [place] [facility] Hadassah SQUIBB, MD   1 week ago Rash   [place] [facility] Herold Hadassah SQUIBB, MD   3 weeks ago Rash   [place] [facility] Melvin Pao, NP   3 weeks ago Disseminated herpes zoster   [place] [facility] Melvin Pao, NP   1 month ago Medicare annual wellness visit, subsequent   [place] [facility] Herold Hadassah SQUIBB, MD

## 2024-04-08 LAB — HERPES SIMPLEX VIRUS CULTURE

## 2024-04-09 ENCOUNTER — Ambulatory Visit: Payer: Self-pay | Admitting: Pediatrics

## 2024-04-11 ENCOUNTER — Encounter: Payer: Self-pay | Admitting: Pediatrics

## 2024-04-11 ENCOUNTER — Telehealth: Payer: Self-pay

## 2024-04-11 NOTE — Telephone Encounter (Signed)
 Ok for E2C2 to review.  Please transfer call to CAL.

## 2024-04-12 ENCOUNTER — Ambulatory Visit (INDEPENDENT_AMBULATORY_CARE_PROVIDER_SITE_OTHER): Admitting: Pediatrics

## 2024-04-12 ENCOUNTER — Encounter: Payer: Self-pay | Admitting: Pediatrics

## 2024-04-12 VITALS — BP 131/70 | HR 57 | Temp 98.0°F | Wt 152.0 lb

## 2024-04-12 DIAGNOSIS — L309 Dermatitis, unspecified: Secondary | ICD-10-CM

## 2024-04-12 LAB — SURGICAL PATHOLOGY

## 2024-04-12 NOTE — Patient Instructions (Addendum)
 For dermatologist: please call [phone] to make an appointment.

## 2024-04-17 ENCOUNTER — Encounter: Payer: Self-pay | Admitting: Pediatrics

## 2024-04-17 ENCOUNTER — Ambulatory Visit

## 2024-04-17 NOTE — Progress Notes (Signed)
 Office Visit  BP 131/70   Pulse (!) 57   Temp 98 F (36.7 C) (Oral)   Wt 152 lb (68.9 kg)   SpO2 98%   BMI 28.72 kg/m    Subjective:    Patient ID: Amanda Cruz, female    DOB: July 23, 1946, 78 y.o.   MRN: [id number]  HPI: Amanda Cruz is a 78 y.o. female  Chief Complaint  Patient presents with   Suture / Staple Removal    Discussed the use of AI scribe software for clinical note transcription with the patient, who gave verbal consent to proceed.  History of Present Illness   Amanda Cruz is a 78 year old female who presents with a non-healing wound and itching.  She has a persistent wound that is not fully healing but appears improved. The wound is itchy, particularly after exposure to heat, such as when cooking. She has been using both oral and topical steroids, specifically Kenalog  (triamcinolone ), which she believes has contributed to the improvement. She frequently covers the wound, which she thinks helps it stay dry.  The wound became itchy after her hand was exposed to heat from the stove. She suspects the wound might be a reaction to a spider bite or contact with flowers, as she has been working in her yard. She notes increased sensitivity to such exposures recently, despite having no issues in the past.  She has enough of the prescription cream and is not using any other treatments on the wound. She often bumps the wound while doing household chores, such as washing dishes, and prefers to keep it covered to prevent irritation.  No significant pain is reported, but there is some discomfort when the wound is manipulated.     Relevant past medical, surgical, family and social history reviewed and updated as indicated. Interim medical history since our last visit reviewed. Allergies and medications reviewed and updated.  ROS per HPI unless specifically indicated above     Objective:    BP 131/70   Pulse (!) 57   Temp 98 F (36.7 C) (Oral)   Wt 152 lb  (68.9 kg)   SpO2 98%   BMI 28.72 kg/m   Wt Readings from Last 3 Encounters:  04/12/24 152 lb (68.9 kg)  04/04/24 149 lb 3.2 oz (67.7 kg)  03/26/24 148 lb 9.6 oz (67.4 kg)     Physical Exam Constitutional:      Appearance: Normal appearance.  Pulmonary:     Effort: Pulmonary effort is normal.  Musculoskeletal:        General: Normal range of motion.  Skin:    Findings: Rash present.     Comments: See image below  Neurological:     General: No focal deficit present.     Mental Status: She is alert. Mental status is at baseline.  Psychiatric:        Mood and Affect: Mood normal.        Behavior: Behavior normal.        Thought Content: Thought content normal.             04/12/2024    9:23 AM 04/04/2024   10:52 AM 03/13/2024   11:19 AM 03/06/2024    4:12 PM 02/29/2024    2:43 PM  Depression screen PHQ 2/9  Decreased Interest 0 0 0 0 0  Down, Depressed, Hopeless 0 0 0 0 0  PHQ - 2 Score 0 0 0 0 0  Altered sleeping 0  0 0  0  Tired, decreased energy 0 0 0  0  Change in appetite 0 0 0  0  Feeling bad or failure about yourself  0 0 0  0  Trouble concentrating 0 0 0  0  Moving slowly or fidgety/restless 0 0 0  0  Suicidal thoughts 0 0 0  0  PHQ-9 Score 0 0 0  0  Difficult doing work/chores Not difficult at all Not difficult at all Not difficult at all         04/12/2024    9:23 AM 04/04/2024   10:52 AM 03/13/2024   11:19 AM 03/06/2024    4:16 PM  GAD 7 : Generalized Anxiety Score  Nervous, Anxious, on Edge 0 0 0 0  Control/stop worrying 0 0 0 0  Worry too much - different things 0 0 0 0  Trouble relaxing 0 0 0 0  Restless 0 0 0 0  Easily annoyed or irritable 0 0 0 0  Afraid - awful might happen 0 0 0 0  Total GAD 7 Score 0 0 0 0  Anxiety Difficulty Not difficult at all Not difficult at all Not difficult at all        Assessment & Plan:  Assessment & Plan   Dermatitis Dermatitis improving with reduced redness and itching. Biopsy suggests reaction to spider bite  or allergen. Not eczema, psoriasis, or shingles. Removed x2 stiches today. - Continue Kenalog  (triamcinolone ) cream for itchy areas. - Avoid oral steroids. - Wear long sleeves in yard. - Apply sunscreen outdoors. - Refer to dermatology for recurrent condition.     Follow up plan: Return in about 3 months (around 07/13/2024) for Chronic illness f/u.  Hadassah SHAUNNA Nett, MD

## 2024-04-22 ENCOUNTER — Other Ambulatory Visit: Payer: Self-pay | Admitting: Pediatrics

## 2024-04-22 DIAGNOSIS — N3281 Overactive bladder: Secondary | ICD-10-CM

## 2024-04-24 NOTE — Telephone Encounter (Signed)
 Requested Prescriptions  Pending Prescriptions Disp Refills   oxybutynin  (DITROPAN -XL) 5 MG 24 hr tablet [Pharmacy Med Name: Oxybutynin  Chloride ER 5 MG Oral Tablet Extended Release 24 Hour] 90 tablet 0    Sig: TAKE 1 TABLET BY MOUTH AT BEDTIME     Urology:  Bladder Agents Passed - 04/24/2024 11:33 AM      Passed - Valid encounter within last 12 months    Recent Outpatient Visits           1 week ago Dermatitis   [place] [facility] Herold Hadassah SQUIBB, MD   2 weeks ago Rash   [place] [facility] Herold Hadassah SQUIBB, MD   4 weeks ago Rash   [place] [facility] Herold Hadassah SQUIBB, MD   1 month ago Rash   [place] [facility] Melvin Pao, NP   1 month ago Disseminated herpes zoster   [place] [facility] Melvin Pao, NP

## 2024-04-29 ENCOUNTER — Other Ambulatory Visit: Payer: Self-pay | Admitting: Pediatrics

## 2024-04-29 DIAGNOSIS — I214 Non-ST elevation (NSTEMI) myocardial infarction: Secondary | ICD-10-CM

## 2024-04-29 DIAGNOSIS — I10 Essential (primary) hypertension: Secondary | ICD-10-CM

## 2024-04-30 ENCOUNTER — Other Ambulatory Visit: Payer: Self-pay | Admitting: Pediatrics

## 2024-04-30 DIAGNOSIS — I10 Essential (primary) hypertension: Secondary | ICD-10-CM

## 2024-04-30 DIAGNOSIS — I214 Non-ST elevation (NSTEMI) myocardial infarction: Secondary | ICD-10-CM

## 2024-05-01 NOTE — Telephone Encounter (Signed)
 Requested Prescriptions  Pending Prescriptions Disp Refills   olmesartan -hydrochlorothiazide  (BENICAR  HCT) 40-25 MG tablet [Pharmacy Med Name: Olmesartan  Medoxomil-HCTZ 40-25 MG Oral Tablet] 90 tablet 0    Sig: Take 1 tablet by mouth once daily     Cardiovascular: ARB + Diuretic Combos Passed - 05/01/2024  3:04 PM      Passed - K in normal range and within 180 days    Potassium  Date Value Ref Range Status  02/29/2024 4.0 3.5 - 5.2 mmol/L Final         Passed - Na in normal range and within 180 days    Sodium  Date Value Ref Range Status  02/29/2024 142 134 - 144 mmol/L Final         Passed - Cr in normal range and within 180 days    Creat  Date Value Ref Range Status  02/16/2022 0.88 0.60 - 1.00 mg/dL Final   Creatinine, Ser  Date Value Ref Range Status  02/29/2024 0.99 0.57 - 1.00 mg/dL Final         Passed - eGFR is 10 or above and within 180 days    GFR, Estimated  Date Value Ref Range Status  11/12/2023 >60 >60 mL/min Final    Comment:    (NOTE) Calculated using the CKD-EPI Creatinine Equation (2021)    eGFR  Date Value Ref Range Status  02/29/2024 58 (L) >59 mL/min/1.73 Final         Passed - Patient is not pregnant      Passed - Last BP in normal range    BP Readings from Last 1 Encounters:  04/12/24 131/70         Passed - Valid encounter within last 6 months    Recent Outpatient Visits           2 weeks ago Dermatitis   [place] [facility] Herold Hadassah SQUIBB, MD   3 weeks ago Rash   [place] [facility] Herold Hadassah SQUIBB, MD   1 month ago Rash   [place] [facility] Herold Hadassah SQUIBB, MD   1 month ago Rash   [place] [facility] Melvin Pao, NP   1 month ago Disseminated herpes zoster   [place] [facility] Melvin Pao, NP

## 2024-05-01 NOTE — Telephone Encounter (Signed)
 Requested Prescriptions  Pending Prescriptions Disp Refills   metoprolol  succinate (TOPROL -XL) 100 MG 24 hr tablet [Pharmacy Med Name: Metoprolol  Succinate ER 100 MG Oral Tablet Extended Release 24 Hour] 90 tablet 0    Sig: TAKE 1 TABLET BY MOUTH ONCE DAILY WITH  OR  IMMEDIATELY  FOLLOW  A  MEAL.     Cardiovascular:  Beta Blockers Passed - 05/01/2024  1:39 PM      Passed - Last BP in normal range    BP Readings from Last 1 Encounters:  04/12/24 131/70         Passed - Last Heart Rate in normal range    Pulse Readings from Last 1 Encounters:  04/12/24 (!) 57         Passed - Valid encounter within last 6 months    Recent Outpatient Visits           2 weeks ago Dermatitis   [place] [facility] Herold Hadassah SQUIBB, MD   3 weeks ago Rash   [place] [facility] Herold Hadassah SQUIBB, MD   1 month ago Rash   [place] [facility] Herold Hadassah SQUIBB, MD   1 month ago Rash   [place] [facility] Melvin Pao, NP   1 month ago Disseminated herpes zoster   [place] [facility] Melvin Pao, NP

## 2024-07-13 ENCOUNTER — Ambulatory Visit: Admitting: Pediatrics

## 2024-07-13 ENCOUNTER — Encounter: Payer: Self-pay | Admitting: Pediatrics

## 2024-07-13 VITALS — BP 160/84 | HR 62 | Temp 98.0°F | Ht 61.0 in | Wt 153.0 lb

## 2024-07-13 DIAGNOSIS — M79671 Pain in right foot: Secondary | ICD-10-CM | POA: Diagnosis not present

## 2024-07-13 DIAGNOSIS — Z23 Encounter for immunization: Secondary | ICD-10-CM

## 2024-07-13 DIAGNOSIS — L989 Disorder of the skin and subcutaneous tissue, unspecified: Secondary | ICD-10-CM | POA: Diagnosis not present

## 2024-07-13 DIAGNOSIS — I10 Essential (primary) hypertension: Secondary | ICD-10-CM

## 2024-07-13 MED ORDER — PREGABALIN 25 MG PO CAPS: 25.0000 mg | ORAL_CAPSULE | Freq: Two times a day (BID) | ORAL | 0 refills | Status: DC

## 2024-07-13 NOTE — Patient Instructions (Addendum)
 Lyrica 25mg  twice daily for pain, if it's not helping please let me know

## 2024-07-13 NOTE — Progress Notes (Signed)
 Office Visit  BP (!) 160/84 (BP Location: Left Arm, Patient Position: Sitting, Cuff Size: Normal)   Pulse 62   Temp 98 F (36.7 C) (Oral)   Ht 5' 1 (1.549 m)   Wt 153 lb (69.4 kg)   SpO2 96%   BMI 28.91 kg/m    Subjective:    Patient ID: Amanda Cruz, female    DOB: 1945/10/30, 78 y.o.   MRN: [id number]  HPI: Amanda Cruz is a 78 y.o. female  Chief Complaint  Patient presents with   Hypertension   Mass    Bump on left side of the back on the head. Onset about a few months ago. Noticed about 3 months ago that bump has been getting bigger. Denies pain.    Foot Pain    Right foot, Onset 3 weeks ago. Very painful. Hurts to walk sometimes. OTC meds does help. Thinks that it is coming from sciatica.  Would like something for pain.     Discussed the use of AI scribe software for clinical note transcription with the patient, who gave verbal consent to proceed.  History of Present Illness   Amanda Cruz is a 78 year old female with hypertension who presents with elevated blood pressure and sciatica.  She has been experiencing elevated blood pressure readings, which she attributes to lack of sleep. Her sleep is disrupted due to the need to administer her husband's medication every eight hours, including late at night. She checks her blood pressure at home only occasionally.  She describes severe foot pain due to sciatica, which began approximately three weeks ago. The pain is so intense that she cannot walk, with tenderness specifically in the insole area. This is her third episode of sciatica, and she has not seen a specialist for this condition. She has not previously taken Lyrica  or similar medications for nerve pain. No recent back injury or falls.  Her eyes are tearing, but there is no pain associated with this symptom. She reports that her eye is red and tearing, and that she was bitten by an insect while cleaning her backyard.  She notes a bump on her head, which is  not painful and resembles a mole. She is concerned about its appearance but has not sought dermatological evaluation.  She is the primary caregiver for her husband, who requires medication administration three times a day. Her son, who usually assists, is currently out of state. She reports significant physical and emotional strain due to her caregiving responsibilities, impacting her sleep and overall well-being.        Relevant past medical, surgical, family and social history reviewed and updated as indicated. Interim medical history since our last visit reviewed. Allergies and medications reviewed and updated.  ROS per HPI unless specifically indicated above     Objective:    BP (!) 160/84 (BP Location: Left Arm, Patient Position: Sitting, Cuff Size: Normal)   Pulse 62   Temp 98 F (36.7 C) (Oral)   Ht 5' 1 (1.549 m)   Wt 153 lb (69.4 kg)   SpO2 96%   BMI 28.91 kg/m   Wt Readings from Last 3 Encounters:  07/13/24 153 lb (69.4 kg)  04/12/24 152 lb (68.9 kg)  04/04/24 149 lb 3.2 oz (67.7 kg)     Physical Exam Constitutional:      Appearance: Normal appearance.  Pulmonary:     Effort: Pulmonary effort is normal.  Musculoskeletal:     Thoracic back: No tenderness.  Lumbar back: Positive right straight leg raise test. Negative left straight leg raise test.     Right foot: Normal. No tenderness or bony tenderness.  Skin:    Comments: Normal skin color  Neurological:     General: No focal deficit present.     Mental Status: She is alert. Mental status is at baseline.  Psychiatric:        Mood and Affect: Mood normal.        Behavior: Behavior normal.        Thought Content: Thought content normal.         07/13/2024   10:18 AM 04/12/2024    9:23 AM 04/04/2024   10:52 AM 03/13/2024   11:19 AM 03/06/2024    4:12 PM  Depression screen PHQ 2/9  Decreased Interest 0 0 0 0 0  Down, Depressed, Hopeless 0 0 0 0 0  PHQ - 2 Score 0 0 0 0 0  Altered sleeping 0 0 0 0    Tired, decreased energy 0 0 0 0   Change in appetite 0 0 0 0   Feeling bad or failure about yourself  0 0 0 0   Trouble concentrating 0 0 0 0   Moving slowly or fidgety/restless 0 0 0 0   Suicidal thoughts 0 0 0 0   PHQ-9 Score 0 0  0  0    Difficult doing work/chores Not difficult at all Not difficult at all Not difficult at all Not difficult at all      Data saved with a previous flowsheet row definition       07/13/2024   10:18 AM 04/12/2024    9:23 AM 04/04/2024   10:52 AM 03/13/2024   11:19 AM  GAD 7 : Generalized Anxiety Score  Nervous, Anxious, on Edge 0 0 0 0  Control/stop worrying 0 0 0 0  Worry too much - different things 0 0 0 0  Trouble relaxing 0 0 0 0  Restless 0 0 0 0  Easily annoyed or irritable 0 0 0 0  Afraid - awful might happen 0 0 0 0  Total GAD 7 Score 0 0 0 0  Anxiety Difficulty Not difficult at all Not difficult at all Not difficult at all Not difficult at all       Assessment & Plan:  Assessment & Plan   Right foot pain Severe right foot pain, possibly sciatica-related. No trauma. Affects mobility. Previous sciatica noted. - Prescribed Lyrica  (pregabalin ) twice daily. - Advised ibuprofen  and Tylenol  as needed. - Consider referral to orthopedics if pain persists. - Scheduled follow-up in 3-4 weeks to assess pain management. -     Pregabalin ; Take 1 capsule (25 mg total) by mouth 2 (two) times daily.  Dispense: 60 capsule; Refill: 0  Essential hypertension Assessment & Plan: Blood pressure elevated, likely stress and sleep-related. Infrequent home monitoring. - Scheduled follow-up in 3-4 weeks for blood pressure monitoring.  Need for vaccination -     Flu vaccine HIGH DOSE PF(Fluzone Trivalent)  Skin lesion Non-painful scalp lesion, likely benign. No malignancy signs. Prefers monitoring. - Documented lesion with photographs. - Advised monitoring for changes.  Follow up plan: Return in about 3 weeks (around 08/03/2024).  Hadassah SHAUNNA Nett,  MD

## 2024-07-23 ENCOUNTER — Other Ambulatory Visit: Payer: Self-pay | Admitting: Pediatrics

## 2024-07-23 DIAGNOSIS — I10 Essential (primary) hypertension: Secondary | ICD-10-CM

## 2024-07-23 DIAGNOSIS — N3281 Overactive bladder: Secondary | ICD-10-CM

## 2024-07-23 DIAGNOSIS — I214 Non-ST elevation (NSTEMI) myocardial infarction: Secondary | ICD-10-CM

## 2024-07-23 NOTE — Telephone Encounter (Unsigned)
 Copied from CRM #[id number]. Topic: Clinical - Medication Refill >> Jul 23, 2024  1:05 PM [name] wrote: Medication: oxybutynin  (DITROPAN -XL) 5 MG 24 hr tablet olmesartan -hydrochlorothiazide  (BENICAR  HCT) 40-25 MG tablet  Has the patient contacted their pharmacy? No (Agent: If no, request that the patient contact the pharmacy for the refill. If patient does not wish to contact the pharmacy document the reason why and proceed with request.) (Agent: If yes, when and what did the pharmacy advise?)  This is the patient's preferred pharmacy:  [facility] [address]), [place] - [address] [place]E OTHEL JACO[place]) [place] [address] Phone: [phone] Fax: [phone]  Is this the correct pharmacy for this prescription? Yes If no, delete pharmacy and type the correct one.   Has the prescription been filled recently? No  Is the patient out of the medication? No  Has the patient been seen for an appointment in the last year OR does the patient have an upcoming appointment? Yes  Can we respond through MyChart? Yes  Agent: Please be advised that Rx refills may take up to 3 business days. We ask that you follow-up with your pharmacy.

## 2024-07-24 ENCOUNTER — Encounter: Payer: Self-pay | Admitting: Pediatrics

## 2024-07-24 MED ORDER — OLMESARTAN MEDOXOMIL-HCTZ 40-25 MG PO TABS
1.0000 | ORAL_TABLET | Freq: Every day | ORAL | 1 refills | Status: AC
Start: 2024-07-24 — End: ?

## 2024-07-24 MED ORDER — OXYBUTYNIN CHLORIDE ER 5 MG PO TB24: 5.0000 mg | ORAL_TABLET | Freq: Every day | ORAL | 1 refills | Status: AC

## 2024-07-24 NOTE — Assessment & Plan Note (Signed)
 Blood pressure elevated, likely stress and sleep-related. Infrequent home monitoring. - Scheduled follow-up in 3-4 weeks for blood pressure monitoring.

## 2024-07-24 NOTE — Telephone Encounter (Signed)
 Requested Prescriptions  Pending Prescriptions Disp Refills   oxybutynin  (DITROPAN -XL) 5 MG 24 hr tablet 90 tablet 1    Sig: Take 1 tablet (5 mg total) by mouth at bedtime.     Urology:  Bladder Agents Passed - 07/24/2024  2:40 PM      Passed - Valid encounter within last 12 months    Recent Outpatient Visits           1 week ago Right foot pain   [place] [facility] Herold Hadassah SQUIBB, MD   3 months ago Dermatitis   [place] [facility] Herold Hadassah SQUIBB, MD   3 months ago Rash   [place] [facility] Herold Hadassah SQUIBB, MD   4 months ago Rash   [place] [facility] Herold Hadassah SQUIBB, MD   4 months ago Rash   [place] [facility] Melvin Pao, NP               olmesartan -hydrochlorothiazide  (BENICAR  HCT) 40-25 MG tablet 90 tablet 1    Sig: Take 1 tablet by mouth daily.     Cardiovascular: ARB + Diuretic Combos Failed - 07/24/2024  2:40 PM      Failed - Last BP in normal range    BP Readings from Last 1 Encounters:  07/13/24 (!) 160/84         Passed - K in normal range and within 180 days    Potassium  Date Value Ref Range Status  02/29/2024 4.0 3.5 - 5.2 mmol/L Final         Passed - Na in normal range and within 180 days    Sodium  Date Value Ref Range Status  02/29/2024 142 134 - 144 mmol/L Final         Passed - Cr in normal range and within 180 days    Creat  Date Value Ref Range Status  02/16/2022 0.88 0.60 - 1.00 mg/dL Final   Creatinine, Ser  Date Value Ref Range Status  02/29/2024 0.99 0.57 - 1.00 mg/dL Final         Passed - eGFR is 10 or above and within 180 days    GFR, Estimated  Date Value Ref Range Status  11/12/2023 >60 >60 mL/min Final    Comment:    (NOTE) Calculated using the CKD-EPI Creatinine Equation (2021)    eGFR  Date Value Ref Range Status  02/29/2024 58 (L) >59 mL/min/1.73 Final         Passed - Patient is not pregnant      Passed - Valid  encounter within last 6 months    Recent Outpatient Visits           1 week ago Right foot pain   [place] [facility] Herold Hadassah SQUIBB, MD   3 months ago Dermatitis   [place] [facility] Herold Hadassah SQUIBB, MD   3 months ago Rash   [place] [facility] Herold Hadassah SQUIBB, MD   4 months ago Rash   [place] [facility] Herold Hadassah SQUIBB, MD   4 months ago Rash   [facility] Melvin Pao, NP

## 2024-07-31 ENCOUNTER — Other Ambulatory Visit: Payer: Self-pay | Admitting: Pediatrics

## 2024-07-31 DIAGNOSIS — I10 Essential (primary) hypertension: Secondary | ICD-10-CM

## 2024-07-31 DIAGNOSIS — I214 Non-ST elevation (NSTEMI) myocardial infarction: Secondary | ICD-10-CM

## 2024-08-03 NOTE — Telephone Encounter (Signed)
 Requested Prescriptions  Pending Prescriptions Disp Refills   metoprolol  succinate (TOPROL -XL) 100 MG 24 hr tablet [Pharmacy Med Name: Metoprolol  Succinate ER 100 MG Oral Tablet Extended Release 24 Hour] 90 tablet 1    Sig: TAKE 1 TABLET BY MOUTH ONCE DAILY WITH OR IMMEDIATELY FOLLOWING A MEAL     Cardiovascular:  Beta Blockers Failed - 08/03/2024  7:51 AM      Failed - Last BP in normal range    BP Readings from Last 1 Encounters:  07/13/24 (!) 160/84         Passed - Last Heart Rate in normal range    Pulse Readings from Last 1 Encounters:  07/13/24 62         Passed - Valid encounter within last 6 months    Recent Outpatient Visits           3 weeks ago Right foot pain   [place] [facility] Herold Hadassah SQUIBB, MD   3 months ago Dermatitis   [place] [facility] Herold Hadassah SQUIBB, MD   4 months ago Rash   [place] [facility] Herold Hadassah SQUIBB, MD   4 months ago Rash   [place] [facility] Herold Hadassah SQUIBB, MD   4 months ago Rash   [place] [facility] Melvin Pao, NP

## 2024-08-08 ENCOUNTER — Encounter: Payer: Self-pay | Admitting: Pediatrics

## 2024-08-08 ENCOUNTER — Ambulatory Visit: Admitting: Pediatrics

## 2024-08-08 VITALS — BP 159/76 | HR 69 | Temp 98.0°F | Ht 61.0 in | Wt 155.4 lb

## 2024-08-08 DIAGNOSIS — Z636 Dependent relative needing care at home: Secondary | ICD-10-CM | POA: Diagnosis not present

## 2024-08-08 DIAGNOSIS — M545 Low back pain, unspecified: Secondary | ICD-10-CM

## 2024-08-08 DIAGNOSIS — G8929 Other chronic pain: Secondary | ICD-10-CM

## 2024-08-08 DIAGNOSIS — I10 Essential (primary) hypertension: Secondary | ICD-10-CM | POA: Diagnosis not present

## 2024-08-08 MED ORDER — METOPROLOL SUCCINATE ER 100 MG PO TB24: 150.0000 mg | ORAL_TABLET | Freq: Every day | ORAL | 1 refills | Status: AC

## 2024-08-08 NOTE — Progress Notes (Signed)
 Office Visit  BP (!) 159/76 (BP Location: Left Arm, Cuff Size: Normal)   Pulse 69   Temp 98 F (36.7 C) (Oral)   Ht 5' 1 (1.549 m)   Wt 155 lb 6.4 oz (70.5 kg)   SpO2 98%   BMI 29.36 kg/m    Subjective:    Patient ID: Amanda Cruz, female    DOB: [birth year], 78 y.o.   MRN: [id number]  HPI: Amanda Cruz is a 78 y.o. female  Chief Complaint  Patient presents with   Hypertension   Foot Pain    Discussed the use of AI scribe software for clinical note transcription with the patient, who gave verbal consent to proceed.  History of Present Illness   Amanda Cruz is a 78 year old female with chronic back pain and hypertension who presents for management of her back pain and uncontrolled blood pressure.  She experiences significant chronic back pain located in her lower back, which is persistent and bothersome. Pregabalin  (Lyrica ) was previously prescribed but did not alleviate the pain. She has a history of sciatica, which has not recurred recently, and she continues to perform exercises previously recommended to manage it.  Her blood pressure is sometimes high at home but remains below 180 mmHg. She stopped taking amlodipine  due to swelling and is currently on metoprolol , which was recently increased to 150 mg daily. She uses a pill cutter to adjust the dosage as needed and is concerned about her blood pressure not being well-controlled despite medication adjustments.  She is a caregiver for her husband. Her sleep is sometimes disrupted due to her responsibilities as a caregiver.  She has one son who is unable to assist with caregiving. Her other son lives in another state but may return by May. She expresses concerns about her ability to manage her husband's care and her own health.     Relevant past medical, surgical, family and social history reviewed and updated as indicated. Interim medical history since our last visit reviewed. Allergies and medications reviewed  and updated.  ROS per HPI unless specifically indicated above     Objective:    BP (!) 159/76 (BP Location: Left Arm, Cuff Size: Normal)   Pulse 69   Temp 98 F (36.7 C) (Oral)   Ht 5' 1 (1.549 m)   Wt 155 lb 6.4 oz (70.5 kg)   SpO2 98%   BMI 29.36 kg/m   Wt Readings from Last 3 Encounters:  08/08/24 155 lb 6.4 oz (70.5 kg)  07/13/24 153 lb (69.4 kg)  04/12/24 152 lb (68.9 kg)     Physical Exam Constitutional:      Appearance: Normal appearance.  Pulmonary:     Effort: Pulmonary effort is normal.  Musculoskeletal:        General: Normal range of motion.  Skin:    Comments: Normal skin color  Neurological:     General: No focal deficit present.     Mental Status: She is alert. Mental status is at baseline.  Psychiatric:        Mood and Affect: Mood normal.        Behavior: Behavior normal.        Thought Content: Thought content normal.         07/13/2024   10:18 AM 04/12/2024    9:23 AM 04/04/2024   10:52 AM 03/13/2024   11:19 AM 03/06/2024    4:12 PM  Depression screen PHQ 2/9  Decreased Interest 0  0 0 0 0  Down, Depressed, Hopeless 0 0 0 0 0  PHQ - 2 Score 0 0 0 0 0  Altered sleeping 0 0 0 0   Tired, decreased energy 0 0 0 0   Change in appetite 0 0 0 0   Feeling bad or failure about yourself  0 0 0 0   Trouble concentrating 0 0 0 0   Moving slowly or fidgety/restless 0 0 0 0   Suicidal thoughts 0 0 0 0   PHQ-9 Score 0 0  0  0    Difficult doing work/chores Not difficult at all Not difficult at all Not difficult at all Not difficult at all      Data saved with a previous flowsheet row definition       07/13/2024   10:18 AM 04/12/2024    9:23 AM 04/04/2024   10:52 AM 03/13/2024   11:19 AM  GAD 7 : Generalized Anxiety Score  Nervous, Anxious, on Edge 0 0 0 0  Control/stop worrying 0 0 0 0  Worry too much - different things 0 0 0 0  Trouble relaxing 0 0 0 0  Restless 0 0 0 0  Easily annoyed or irritable 0 0 0 0  Afraid - awful might happen 0 0 0 0   Total GAD 7 Score 0 0 0 0  Anxiety Difficulty Not difficult at all Not difficult at all Not difficult at all Not difficult at all       Assessment & Plan:  Assessment & Plan   Essential hypertension Hypertension uncontrolled. Amlodipine  discontinued due to swelling a few months ago. Increased metoprolol . Cardiology referral for HTN clinic. - Continue olmesartan  hydrochlorothiazide  40-25 combo, spironolactone  100mg , metop increased to 150mg  until cardiology eval. - Increased metoprolol  to 150 mg daily using 100 mg and 50 mg tablets. - Referred to cardiology for further management. - Scheduled follow-up in 6-8 weeks to reassess blood pressure. -     Metoprolol  Succinate ER; Take 1.5 tablets (150 mg total) by mouth daily. Take with or immediately following a meal.  Dispense: 90 tablet; Refill: 1 -     Ambulatory referral to Cardiology   Chronic low back pain, unspecified back pain laterality, unspecified whether sciatica present Pain persists despite pregabalin . Previous sciatica resolved. Exercises beneficial. - Discontinued pregabalin . - Referred to orthopedics for evaluation and management. - Recommended physical therapy as part of orthopedic evaluation. - Consider x-ray during orthopedic visit. -     Ambulatory referral to Orthopedic Surgery  Caregiver stress Experiencing stress due to caregiving responsibilities. Discussed support resources. Will try to look through husbands medication list to see if we can make adjustments. - Messaged social worker Amanda Cruz for caregiver support and financial assistance. - Coordinated with social services for additional support.  -     AMB Referral [facility] Care Management   Follow up plan: Return in about 6 weeks (around 09/19/2024) for HTN.  Amanda SHAUNNA Nett, MD

## 2024-08-09 ENCOUNTER — Ambulatory Visit

## 2024-08-09 ENCOUNTER — Ambulatory Visit (INDEPENDENT_AMBULATORY_CARE_PROVIDER_SITE_OTHER): Admitting: Physician Assistant

## 2024-08-09 DIAGNOSIS — M549 Dorsalgia, unspecified: Secondary | ICD-10-CM

## 2024-08-09 DIAGNOSIS — G8929 Other chronic pain: Secondary | ICD-10-CM

## 2024-08-09 DIAGNOSIS — M51362 Other intervertebral disc degeneration, lumbar region with discogenic back pain and lower extremity pain: Secondary | ICD-10-CM

## 2024-08-09 DIAGNOSIS — M5416 Radiculopathy, lumbar region: Secondary | ICD-10-CM | POA: Diagnosis not present

## 2024-08-09 DIAGNOSIS — R296 Repeated falls: Secondary | ICD-10-CM

## 2024-08-09 MED ORDER — PREDNISONE 10 MG (21) PO TBPK: ORAL_TABLET | ORAL | 0 refills | Status: DC

## 2024-08-09 NOTE — Progress Notes (Signed)
 Orthopaedic Surgery New Patient Visit   History of Present Illness: The patient is a 78 y.o. female seen in clinic for 109-month history of midline back pain with right-sided radiculopathy.  Patient describes constant ache/soreness in mid lower back.  Associated tenderness over right lower back paraspinal musculature.  States is constant.  Moderate in severity. States is exacerbated with trunk extension.  Patient reports associated pain radiating into buttock and posterior aspect of right lower extremity.  Patient reports burning sensation on plantar aspect of bilateral feet, right worse than left.  Patient has fallen 3-4 times in the past 6 months.  Patient states she has had no significant injury from these falls, typically lands on her buttocks.  Patient has taken over-the-counter Tylenol , ibuprofen , applied ice and heat with minimal symptom improvement.  Patient denies saddle anesthesia, bowel/bladder incontinence, urinary retention.  Patient reports previous history of neck pain and diagnosis of cervical degenerative disc disease.  Patient states her primary care provider diagnosed her with sciatica in November 2025, prescribed Lyrica , which has provided moderate symptom improvement.  Patient states she has previously completed course of physical therapy status post right knee surgery (ligament repair), decades ago.  Patient prescribed prednisone  40 mg once a day x 5 days in July and in August 2025 for treatment of rash, suspected herpes zoster.  Patient seen by PCP, Hadassah Nett, MD with [facility], yesterday 08/08/2024.  Patient reported chronic back pain.  Referred to orthopedic surgery for further evaluation and treatment.  Patient in March 2025 had episode of septic shock secondary to UTI.  Patient also with significantly elevated troponins, peak troponin 6095, secondary to demand ischemia.  Left heart cath performed on 11/10/2023 revealed no evidence of significant  CAD.  Patient on VitD supplement. Also taking OTC glucosamine supplement.  Unclear of diagnosis of osteoporosis; bone density scan seen from 2014 that was normal.  No smoking history.  No DM2 diagnosis.  Patient's last A1c 5.3% on 01/12/2024, 7 months ago.  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   Past Medical, Social and Family History: Past Medical History:  Diagnosis Date   Asthma    Dont remember   Cataract    Dont remember   Claudication 06/13/2020   COVID-19 2021   E. coli septicemia (HCC) 11/08/2023   GERD (gastroesophageal reflux disease)    Hypertension    Septic shock (HCC) 11/07/2023   Wears dentures    full upper and lower   Past Surgical History:  Procedure Laterality Date   ANTERIOR AND POSTERIOR REPAIR N/A 05/11/2021   Procedure: ANTERIOR (CYSTOCELE) AND POSTERIOR REPAIR (RECTOCELE);  Surgeon: Janit Alm Agent, MD;  Location: [facility] ORS;  Service: Gynecology;  Laterality: N/A;   CATARACT EXTRACTION W/PHACO Left 08/22/2019   Procedure: CATARACT EXTRACTION PHACO AND INTRAOCULAR LENS PLACEMENT (IOC) LEFT  9.98  01:10.4  14.2%;  Surgeon: Mittie Gaskin, MD;  Location: [facility];  Service: Ophthalmology;  Laterality: Left;   CATARACT EXTRACTION W/PHACO Right 09/19/2019   Procedure: CATARACT EXTRACTION PHACO AND INTRAOCULAR LENS PLACEMENT (IOC) RIGHT PANOPTIX LENS 7.44  00:52.4  14.3%;  Surgeon: Mittie Gaskin, MD;  Location: [facility];  Service: Ophthalmology;  Laterality: Right;   COLONOSCOPY WITH PROPOFOL  N/A 05/09/2018   Procedure: COLONOSCOPY WITH PROPOFOL ;  Surgeon: Jinny Carmine, MD;  Location: [facility] ENDOSCOPY;  Service: Endoscopy;  Laterality: N/A;   ESOPHAGOGASTRODUODENOSCOPY (EGD) WITH PROPOFOL  N/A 02/05/2020   Procedure: ESOPHAGOGASTRODUODENOSCOPY (EGD) WITH PROPOFOL ;  Surgeon: Jinny Carmine, MD;  Location: [facility]  ENDOSCOPY;  Service: Endoscopy;  Laterality: N/A;   EYE SURGERY     FOOT SURGERY Bilateral    KNEE ARTHROSCOPY  Right    LEFT HEART CATH AND CORONARY ANGIOGRAPHY N/A 11/10/2023   Procedure: LEFT HEART CATH AND CORONARY ANGIOGRAPHY;  Surgeon: Mady Bruckner, MD;  Location: [facility] INVASIVE CV LAB;  Service: Cardiovascular;  Laterality: N/A;   PUBOVAGINAL SLING N/A 05/11/2021   Procedure: PUBO-VAGINAL SLING (TOT);  Surgeon: Janit Alm Agent, MD;  Location: [facility] ORS;  Service: Gynecology;  Laterality: N/A;   TEAR DUCT PROBING  03/10/2015   TUBAL LIGATION     VAGINAL HYSTERECTOMY N/A 05/11/2021   Procedure: HYSTERECTOMY VAGINAL;  Surgeon: Janit Alm Agent, MD;  Location: [facility] ORS;  Service: Gynecology;  Laterality: N/A;   Allergies[1] Medications Ordered Prior to Encounter[2] Social History[3]    I have reviewed past medical, surgical, social and family history, medications and allergies as documented in the EMR.  Review of Systems - A ROS was performed including pertinent positives and negatives as documented in the HPI.     Physical Exam:  General/Constitutional: NAD Vascular: No edema, swelling or tenderness, except as noted in detailed exam Integumentary: No impressive skin lesions present, except as noted in detailed exam Neuro/Psych: Normal mood and affect, oriented to person, place and time Musculoskeletal: Normal, except as noted in detailed exam and in HPI   Focused Orthopaedic Examination:  MSK (spine): Normal to inspection. No pain with flexion, lateral flexion, or rotation. Pain with extension. Mild tenderness with palpation over midline lower lumbar spine and right sided paraspinal musculature. No SI joint tenderness.   -Strength exam      Left  Right  EHL    5/5  5/5 TA    5/5  5/5 GSC    5/5  5/5 Knee extension  5/5  4+/5 Hip flexion   5/5  4+/5 Knee flexion   5/5  4+/5  -Sensory exam    Sensation intact to light touch in L3-S1 nerve distributions of bilateral lower extremities  Sensation intact to light touch in C5-T1 nerve distributions of bilateral upper  extremities   -Patellar tendon DTR: 2+/4 on the left, 2+/4 on the right  -Spurling: Negative -Straight leg raise: Positive, right -Gait: No abnormality   Vascular/Lymphatic: 2+ dorsalis pedis/posterior tibialis pulses, foot warm and well perfused      XR Lumbar Spine Imaging: X-rays of the lumbar spine including 2 views (AP, lateral) obtained today 08/09/2024 at [facility]surgery at [facility] were reviewed personally by me.  Per my independent interpretation these images show no acute fracture or dislocation.  Diffuse degenerative disc disease of lumbar spine with endplate spurring.  Disc height loss, most prevalent at L3-L4.  Retrolisthesis of L3 and L4.     Assessment:  Lumbar degenerative disc disease Right sided lumbar radiculopathy L3 and L4 retrolisthesis     Plan:  Patient was seen and examined in office today. We reviewed patient's history, examination, and imaging in detail. Based on information available for this encounter, patient with 67-month history of low back pain and right sided lumbar radiculopathy.  Associated right lower extremity weakness.  Patient also with several falls.  Patient on physical exam with positive right straight leg raise, positive Stinchfield, and muscle weakness.  Patient's lumbar spine x-ray performed in office today reveals diffuse degenerative disc disease.  Suspect symptoms primarily due to lumbago with lumbar radiculopathy.  Discussed conservative management of rest, ice/heat, over-the-counter anti-inflammatories and analgesics, formal physical therapy, prescription  medication, lumbar epidural steroid injection.  Though patient is primary caretaker for husband, patient states that she would be available to attend physical therapy sessions.  Referral placed; included core strengthening and balance improvement in physical therapy request to help reduce frequent falls.  Patient stated not interested in LESI at this time, requested  oral course of steroid.  Prescribed Sterapred pack.  Patient will continued Lyrica .  Patient will return to clinic in 8 weeks for reevaluation.  Discussed possibility of referral to pain management for LESI.    Patient education material was provided.  All questions, concerns and comments were addressed to the best of my ability.  Follow-up: 8 weeks for re-evaluation; sooner with any new/worsening symptoms or concerns   Lucie Stabs, PA-C Orthopedic Surgery & Sports Medicine    This document was dictated using Dragon voice recognition software. A reasonable attempt at proof reading has been made to minimize errors.      [1]  Allergies Allergen Reactions   Amlodipine  Swelling    Lower extremity swelling  [2]  Current Outpatient Medications on File Prior to Visit  Medication Sig Dispense Refill   albuterol  (VENTOLIN  HFA) 108 (90 Base) MCG/ACT inhaler Inhale 2 puffs into the lungs every 6 (six) hours as needed for wheezing or shortness of breath. 8 g 0   atorvastatin  (LIPITOR ) 40 MG tablet Take 1 tablet by mouth once daily 90 tablet 1   Biotin w/ Vitamins C & E (HAIR/SKIN/NAILS PO) Take 1 tablet by mouth daily.     Cholecalciferol  (VITAMIN D3) 25 MCG (1000 UT) CAPS Take 1,000 Units by mouth daily.     fluticasone -salmeterol (ADVAIR) 100-50 MCG/ACT AEPB Inhale 1 puff into the lungs 2 (two) times daily. 1 each 3   Glucosamine 500 MG CAPS Take by mouth.     metoprolol  succinate (TOPROL -XL) 100 MG 24 hr tablet Take 1.5 tablets (150 mg total) by mouth daily. Take with or immediately following a meal. 90 tablet 1   olmesartan -hydrochlorothiazide  (BENICAR  HCT) 40-25 MG tablet Take 1 tablet by mouth daily. 90 tablet 1   oxybutynin  (DITROPAN -XL) 5 MG 24 hr tablet Take 1 tablet (5 mg total) by mouth at bedtime. 90 tablet 1   spironolactone  (ALDACTONE ) 100 MG tablet Take 1 tablet (100 mg total) by mouth daily. 30 tablet 2   triamcinolone  (KENALOG ) 0.025 % ointment Apply 1  Application topically 2 (two) times daily. Do not use more than 14 days at a time 30 g 0   No current facility-administered medications on file prior to visit.  [3]  Social History Tobacco Use   Smoking status: Never   Smokeless tobacco: Never  Vaping Use   Vaping status: Never Used  Substance Use Topics   Alcohol  use: Not Currently    Alcohol /week: 1.0 standard drink of alcohol     Types: 1 Glasses of wine per week   Drug use: No

## 2024-08-10 NOTE — Addendum Note (Signed)
 Addended by: VANDERBILT PLOWMAN L on: 08/10/2024 11:11 AM   Modules accepted: Orders

## 2024-08-20 ENCOUNTER — Telehealth: Payer: Self-pay

## 2024-08-20 NOTE — Progress Notes (Signed)
 Complex Care Management Note Care Guide Note  08/20/2024 Name: Amanda Cruz MRN: [id number] DOB: [birth year]   Complex Care Management Outreach Attempts: An unsuccessful telephone outreach was attempted today to offer the patient information about available complex care management services.  Follow Up Plan:  Additional outreach attempts will be made to offer the patient complex care management information and services.   Encounter Outcome:  No Answer  Jeoffrey Buffalo , RMA       [facility], [facility] Guide  Direct Dial: [phone]  Website: [email or web address]

## 2024-08-20 NOTE — Progress Notes (Signed)
 Complex Care Management Note  Care Guide Note 08/20/2024 Name: Amanda Cruz MRN: [id number] DOB: [birth year]  Amanda Cruz Honor is a 78 y.o. year old female who sees Herold, Hadassah SQUIBB, MD for primary care. I reached out to [name] by phone today to offer complex care management services.  [name] was given information about Complex Care Management services today including:   The Complex Care Management services include support from the care team which includes your Nurse Care Manager, Clinical Social Worker, or Pharmacist.  The Complex Care Management team is here to help remove barriers to the health concerns and goals most important to you. Complex Care Management services are voluntary, and the patient may decline or stop services at any time by request to their care team member.   Complex Care Management Consent Status: Patient agreed to services and verbal consent obtained.   Follow up plan:  Telephone appointment with complex care management team member scheduled for:  09/13/2023  Encounter Outcome:  Patient Scheduled  Jeoffrey Buffalo , RMA     [place]  [facility], [facility] Guide  Direct Dial: [phone]  Website: [email or web address]

## 2024-08-21 ENCOUNTER — Ambulatory Visit

## 2024-08-21 VITALS — BP 140/70 | HR 75 | Ht 61.0 in | Wt 154.0 lb

## 2024-08-21 DIAGNOSIS — I1A0 Resistant hypertension: Secondary | ICD-10-CM

## 2024-08-21 DIAGNOSIS — I700 Atherosclerosis of aorta: Secondary | ICD-10-CM

## 2024-08-21 DIAGNOSIS — E782 Mixed hyperlipidemia: Secondary | ICD-10-CM | POA: Diagnosis not present

## 2024-08-21 DIAGNOSIS — I739 Peripheral vascular disease, unspecified: Secondary | ICD-10-CM

## 2024-08-21 DIAGNOSIS — I340 Nonrheumatic mitral (valve) insufficiency: Secondary | ICD-10-CM

## 2024-08-21 MED ORDER — SPIRONOLACTONE 100 MG PO TABS: 100.0000 mg | ORAL_TABLET | Freq: Every day | ORAL | 3 refills | Status: DC

## 2024-08-21 NOTE — Patient Instructions (Signed)
 Medication Instructions:  Your physician recommends the following medication changes.    START TAKING: Restart Spironolactone  100 mg daily.   *If you need a refill on your cardiac medications before your next appointment, please call your pharmacy*  Lab Work: Your provider would like for you to have following labs drawn today Aldosterone with Renin.     Testing/Procedures:   Your physician has requested that you have an ankle brachial index (ABI). During this test an ultrasound and blood pressure cuff are used to evaluate the arteries that supply the arms and legs with blood.  Allow thirty minutes for this exam.  There are no restrictions or special instructions.  This will take place at [address] [facility] [address], [place] [address]  Please note: We ask at that you not bring children with you during ultrasound (echo/ vascular) testing. Due to room size and safety concerns, children are not allowed in the ultrasound rooms during exams. Our front office staff cannot provide observation of children in our lobby area while testing is being conducted. An adult accompanying a patient to their appointment will only be allowed in the ultrasound room at the discretion of the ultrasound technician under special circumstances. We apologize for any inconvenience.   Your physician has requested that you have a renal artery duplex. During this test, an ultrasound is used to evaluate blood flow to the kidneys. Take your medications as you usually do. This will take place at [address] [facility] [address], [place] [address].  No food after 11PM the night before.  Water  is OK. (Don't drink liquids if you have been instructed not to for ANOTHER test). Avoid foods that produce bowel gas, for 24 hours prior to exam (see below). No breakfast, no chewing gum, no smoking or carbonated beverages. Patient may take morning medications with water . Come in for test at least  15 minutes early to register.  Please note: We ask at that you not bring children with you during ultrasound (echo/ vascular) testing. Due to room size and safety concerns, children are not allowed in the ultrasound rooms during exams. Our front office staff cannot provide observation of children in our lobby area while testing is being conducted. An adult accompanying a patient to their appointment will only be allowed in the ultrasound room at the discretion of the ultrasound technician under special circumstances. We apologize for any inconvenience.   Follow-Up: At [facility], you and your health needs are our priority.  As part of our continuing mission to provide you with exceptional heart care, our providers are all part of one team.  This team includes your primary Cardiologist (physician) and Advanced Practice Providers or APPs (Physician Assistants and Nurse Practitioners) who all work together to provide you with the care you need, when you need it.  Your next appointment:   6 month(s)  Provider:   Caron Poser, MD

## 2024-08-21 NOTE — Progress Notes (Signed)
" °  Cardiology Office Note   Date:  08/21/2024  ID:  BELISA EICHHOLZ, DOB 18-Dec-1945, MRN [id number] PCP: Herold Hadassah SQUIBB, MD  [facility] Providers Cardiologist:  Caron Poser, MD   History of Present Illness Amanda Cruz is a 78 y.o. female PMH resistant hypertension, HLD who presents for further evaluation and management of resistant hypertension.  Patient reports that she has been dealing with high blood pressure for most of her adult life.  She denies any significant symptoms related to this.  She reports she has been told that she snores in the past.  She has never had a sleep study.  She is not currently taking the spironolactone  that her PCP prescribed.  Last LDL 46 12/2023.  Relevant CVD History - Cath 10/2023 no significant coronary disease - TTE 10/2023 LVEF 50 to 55%, moderate LVH, moderate MR - CTPA 10/2023 severe aortic atherosclerosis, no significant CAC  ROS: Pt denies any chest discomfort, jaw pain, arm pain, palpitations, syncope, presyncope, orthopnea, PND, or LE edema.  Studies Reviewed I have independently reviewed the patient's ECG, previous cardiac testing, previous medical records, previous blood work.  Physical Exam VS:  BP (!) 140/70 (BP Location: Right Arm, Patient Position: Sitting, Cuff Size: Normal)   Pulse 75   Ht 5' 1 (1.549 m)   Wt 154 lb (69.9 kg)   SpO2 97%   BMI 29.10 kg/m        Wt Readings from Last 3 Encounters:  08/21/24 154 lb (69.9 kg)  08/08/24 155 lb 6.4 oz (70.5 kg)  07/13/24 153 lb (69.4 kg)    GEN: No acute distress. NECK: No JVD; No carotid bruits. CARDIAC: RRR, no murmurs, rubs, gallops. RESPIRATORY:  Clear to auscultation. EXTREMITIES:  Warm and well-perfused. No edema.  ASSESSMENT AND PLAN Resistant hypertension Patient presents for further evaluation of resistant hypertension.  Her BP is 140/70 in office today.  She is not currently taking her spironolactone  for unclear reasons.  I think is reasonable to pursue  a secondary hypertension evaluation, though given her age, I do not think we need to get her blood pressure to 120/80.  A systolic goal of 859d is probably fine for her.  Plan: - Continue olmesartan -HCTZ 40-25 mg daily - Continue metoprolol  XL 150 mg daily; we can always switch her to carvedilol if we need more BP control - I encouraged her to take the spironolactone  prescribed by her PCP - She has had swelling with norvasc , so will avoid this now if we can - Will obtain a renal artery duplex, renin-aldosterone testing, and refer her to sleep medicine for sleep study given history of snoring as part of secondary HTN workup - She does not have any symptoms suggestive of pheochromocytoma, so we will hold off on testing for this  HLD Aortic atherosclerosis Seen on prior CT scan.  HLD is well-controlled, last LDL was 46 12/2023.  Continue Lipitor  40 mg daily.  Moderate MR Seen on prior echocardiogram.  Asymptomatic.  Can plan to repeat an echo in 1 year from last exam, around 10/2024 or after.  Claudication Patient reports left lower extremity cramping with exertion that improves with rest.  Will obtain ABIs.        Dispo: RTC 6 months or sooner as needed  Signed, Caron Poser, MD  "

## 2024-08-26 ENCOUNTER — Other Ambulatory Visit: Payer: Self-pay | Admitting: Pediatrics

## 2024-08-26 DIAGNOSIS — M79671 Pain in right foot: Secondary | ICD-10-CM

## 2024-09-02 LAB — ALDOSTERONE + RENIN ACTIVITY W/ RATIO
Aldos/Renin Ratio: 14.2 (ref 0.0–30.0)
Aldosterone: 3.2 ng/dL (ref 0.0–30.0)
Renin Activity, Plasma: 0.226 ng/mL/h (ref 0.167–5.380)

## 2024-09-03 ENCOUNTER — Ambulatory Visit: Payer: Self-pay

## 2024-09-12 ENCOUNTER — Telehealth: Payer: Self-pay | Admitting: *Deleted

## 2024-09-21 ENCOUNTER — Other Ambulatory Visit: Payer: Self-pay

## 2024-09-21 DIAGNOSIS — E782 Mixed hyperlipidemia: Secondary | ICD-10-CM

## 2024-09-21 DIAGNOSIS — I340 Nonrheumatic mitral (valve) insufficiency: Secondary | ICD-10-CM

## 2024-09-21 DIAGNOSIS — I700 Atherosclerosis of aorta: Secondary | ICD-10-CM

## 2024-09-21 DIAGNOSIS — I1A0 Resistant hypertension: Secondary | ICD-10-CM

## 2024-09-21 DIAGNOSIS — I739 Peripheral vascular disease, unspecified: Secondary | ICD-10-CM

## 2024-09-25 ENCOUNTER — Ambulatory Visit

## 2024-09-27 ENCOUNTER — Encounter: Payer: Self-pay | Admitting: Family Medicine

## 2024-09-27 ENCOUNTER — Ambulatory Visit (INDEPENDENT_AMBULATORY_CARE_PROVIDER_SITE_OTHER): Admitting: Family Medicine

## 2024-09-27 ENCOUNTER — Encounter: Payer: Self-pay | Admitting: *Deleted

## 2024-09-27 VITALS — BP 132/62 | HR 64 | Temp 97.9°F | Ht 61.0 in | Wt 153.0 lb

## 2024-09-27 DIAGNOSIS — I10 Essential (primary) hypertension: Secondary | ICD-10-CM | POA: Diagnosis not present

## 2024-09-27 DIAGNOSIS — M25562 Pain in left knee: Secondary | ICD-10-CM

## 2024-09-27 DIAGNOSIS — E7849 Other hyperlipidemia: Secondary | ICD-10-CM | POA: Diagnosis not present

## 2024-09-27 DIAGNOSIS — R202 Paresthesia of skin: Secondary | ICD-10-CM

## 2024-09-27 MED ORDER — DICLOFENAC SODIUM 1 % EX GEL: 4.0000 g | Freq: Four times a day (QID) | CUTANEOUS | 3 refills | Status: AC

## 2024-09-27 MED ORDER — SPIRONOLACTONE 100 MG PO TABS: 100.0000 mg | ORAL_TABLET | Freq: Every day | ORAL | 2 refills | Status: AC

## 2024-09-27 NOTE — Progress Notes (Signed)
 "  BP 132/62   Pulse 64   Temp 97.9 F (36.6 C) (Oral)   Ht 5' 1 (1.549 m)   Wt 153 lb (69.4 kg)   SpO2 98%   BMI 28.91 kg/m    Subjective:    Patient ID: Amanda Cruz, female    DOB: 1946/04/28, 79 y.o.   MRN: [id number]  HPI: Amanda Cruz is a 79 y.o. female who presents today to re-establish care after her PCP left the practice.  Chief Complaint  Patient presents with   Transitions Of Care   Knee Pain    With swelling. Onset about a month ago. Left leg. Concerns of gout.    HYPERTENSION  Hypertension status: better  Satisfied with current treatment? yes Duration of hypertension: chronic BP monitoring frequency:  occasionally BP medication side effects:  yes Medication compliance: good compliance Previous BP meds: olmesartan , hydrochlorothiazide , spironolactone , metoprolol  Aspirin : yes Recurrent headaches: no Visual changes: no Palpitations: no Dyspnea: no Chest pain: no Lower extremity edema: no Dizzy/lightheaded: yes  KNEE PAIN Duration: about a month ago Involved knee: left Mechanism of injury: unknown Location:diffuse Onset: sudden Severity: severe  Quality:  striking, ticking Frequency: intermittent Radiation: into her thigh and calf Aggravating factors: nothing  Alleviating factors: nothing  Status: worse Treatments attempted: advil , tylenol  arthritis  Relief with NSAIDs?:  mild Weakness with weight bearing or walking: yes Sensation of giving way: no Locking: no Popping: no Bruising: no Swelling: no Redness: no Paresthesias/decreased sensation: yes Fevers: no  Active Ambulatory Problems    Diagnosis Date Noted   Essential hypertension 03/13/2015   Acid reflux 03/13/2015   Cystocele with uterine prolapse 04/24/2015   Polyp of sigmoid colon    Chronic venous insufficiency 08/07/2018   Hyperlipidemia 08/07/2018   Varicose veins of bilateral lower extremities with other complications 08/25/2018   Dermatitis 04/17/2020   Sciatica of  left side 06/13/2020   Complete uterovaginal prolapse 05/11/2021   Lung nodule 11/08/2023   SVT (supraventricular tachycardia) 11/09/2023   NSTEMI (non-ST elevated myocardial infarction) (HCC) 11/11/2023   Allergic rhinitis 02/02/2024   Resolved Ambulatory Problems    Diagnosis Date Noted   Pessary maintenance 04/24/2015   Annual physical exam    Acute gastritis without hemorrhage    COVID-19 virus infection 05/29/2020   Claudication 06/13/2020   Dizziness 07/01/2020   Shortness of breath 11/06/2020   Cough 12/05/2020   Post-operative state 05/11/2021   Demand ischemia (HCC) 11/07/2023   Septic shock (HCC) 11/07/2023   Hypokalemia 11/07/2023   Hypomagnesemia 11/07/2023   Dyslipidemia 11/07/2023   Hyponatremia 11/08/2023   E. coli septicemia (HCC) 11/08/2023   Past Medical History:  Diagnosis Date   Asthma    Cataract    COVID-19 2021   GERD (gastroesophageal reflux disease)    Hypertension    Wears dentures    Past Surgical History:  Procedure Laterality Date   ANTERIOR AND POSTERIOR REPAIR N/A 05/11/2021   Procedure: ANTERIOR (CYSTOCELE) AND POSTERIOR REPAIR (RECTOCELE);  Surgeon: Janit Alm Agent, MD;  Location: [facility];  Service: Gynecology;  Laterality: N/A;   CATARACT EXTRACTION W/PHACO Left 08/22/2019   Procedure: CATARACT EXTRACTION PHACO AND INTRAOCULAR LENS PLACEMENT (IOC) LEFT  9.98  01:10.4  14.2%;  Surgeon: Mittie Gaskin, MD;  Location: [facility];  Service: Ophthalmology;  Laterality: Left;   CATARACT EXTRACTION W/PHACO Right 09/19/2019   Procedure: CATARACT EXTRACTION PHACO AND INTRAOCULAR LENS PLACEMENT (IOC) RIGHT PANOPTIX LENS 7.44  00:52.4  14.3%;  Surgeon: Mittie Gaskin, MD;  Location: [facility];  Service: Ophthalmology;  Laterality: Right;   COLONOSCOPY WITH PROPOFOL  N/A 05/09/2018   Procedure: COLONOSCOPY WITH PROPOFOL ;  Surgeon: Jinny Carmine, MD;  Location: [facility];  Service: Endoscopy;  Laterality: N/A;    ESOPHAGOGASTRODUODENOSCOPY (EGD) WITH PROPOFOL  N/A 02/05/2020   Procedure: ESOPHAGOGASTRODUODENOSCOPY (EGD) WITH PROPOFOL ;  Surgeon: Jinny Carmine, MD;  Location: [facility];  Service: Endoscopy;  Laterality: N/A;   EYE SURGERY     FOOT SURGERY Bilateral    KNEE ARTHROSCOPY Right    LEFT HEART CATH AND CORONARY ANGIOGRAPHY N/A 11/10/2023   Procedure: LEFT HEART CATH AND CORONARY ANGIOGRAPHY;  Surgeon: Mady Bruckner, MD;  Location: [facility];  Service: Cardiovascular;  Laterality: N/A;   PUBOVAGINAL SLING N/A 05/11/2021   Procedure: PUBO-VAGINAL SLING (TOT);  Surgeon: Janit Alm Agent, MD;  Location: [facility];  Service: Gynecology;  Laterality: N/A;   TEAR DUCT PROBING  03/10/2015   TUBAL LIGATION     VAGINAL HYSTERECTOMY N/A 05/11/2021   Procedure: HYSTERECTOMY VAGINAL;  Surgeon: Janit Alm Agent, MD;  Location: [facility];  Service: Gynecology;  Laterality: N/A;   Outpatient Encounter Medications as of 09/27/2024  Medication Sig   albuterol  (VENTOLIN  HFA) 108 (90 Base) MCG/ACT inhaler Inhale 2 puffs into the lungs every 6 (six) hours as needed for wheezing or shortness of breath.   atorvastatin  (LIPITOR ) 40 MG tablet Take 1 tablet by mouth once daily   Biotin w/ Vitamins C & E (HAIR/SKIN/NAILS PO) Take 1 tablet by mouth daily.   Cholecalciferol  (VITAMIN D3) 25 MCG (1000 UT) CAPS Take 1,000 Units by mouth daily.   diclofenac  Sodium (VOLTAREN ) 1 % GEL Apply 4 g topically 4 (four) times daily.   fluticasone -salmeterol (ADVAIR) 100-50 MCG/ACT AEPB Inhale 1 puff into the lungs 2 (two) times daily.   Glucosamine 500 MG CAPS Take by mouth.   metoprolol  succinate (TOPROL -XL) 100 MG 24 hr tablet Take 1.5 tablets (150 mg total) by mouth daily. Take with or immediately following a meal.   olmesartan -hydrochlorothiazide  (BENICAR  HCT) 40-25 MG tablet Take 1 tablet by mouth daily.   oxybutynin  (DITROPAN -XL) 5 MG 24 hr tablet Take 1 tablet (5 mg total) by mouth at bedtime.   triamcinolone   (KENALOG ) 0.025 % ointment Apply 1 Application topically 2 (two) times daily. Do not use more than 14 days at a time (Patient taking differently: Apply 1 Application topically 2 (two) times daily. Do not use more than 14 days at a time. AS NEEDED)   [DISCONTINUED] spironolactone  (ALDACTONE ) 100 MG tablet Take 1 tablet (100 mg total) by mouth daily.   spironolactone  (ALDACTONE ) 100 MG tablet Take 1 tablet (100 mg total) by mouth daily.   [DISCONTINUED] predniSONE  (STERAPRED UNI-PAK 21 TAB) 10 MG (21) TBPK tablet Use as directed. (Patient not taking: Reported on 08/21/2024)   No facility-administered encounter medications on file as of 09/27/2024.   Allergies[1] Family History  Problem Relation Age of Onset   Stroke Mother    Heart failure Mother    Thyroid  disease Paternal Grandfather    Social History   Socioeconomic History   Marital status: Married    Spouse name: Not on file   Number of children: Not on file   Years of education: Not on file   Highest education level: Associate degree: academic program  Occupational History   Not on file  Tobacco Use   Smoking status: Never   Smokeless tobacco: Never  Vaping Use   Vaping status: Never Used  Substance and  Sexual Activity   Alcohol  use: Not Currently    Alcohol /week: 1.0 standard drink of alcohol     Types: 1 Glasses of wine per week   Drug use: No   Sexual activity: Not Currently    Birth control/protection: Post-menopausal, Surgical    Comment: hysterectomy  Other Topics Concern   Not on file  Social History Narrative   Not on file   Social Drivers of Health   Tobacco Use: Low Risk (09/27/2024)   Patient History    Smoking Tobacco Use: Never    Smokeless Tobacco Use: Never    Passive Exposure: Not on file  Financial Resource Strain: Medium Risk (01/10/2024)   Overall Financial Resource Strain (CARDIA)    Difficulty of Paying Living Expenses: Somewhat hard  Food Insecurity: Food Insecurity Present (01/10/2024)    Hunger Vital Sign    Worried About Running Out of Food in the Last Year: Often true    Ran Out of Food in the Last Year: Often true  Transportation Needs: No Transportation Needs (01/10/2024)   PRAPARE - Administrator, Civil Service (Medical): No    Lack of Transportation (Non-Medical): No  Physical Activity: Insufficiently Active (01/10/2024)   Exercise Vital Sign    Days of Exercise per Week: 7 days    Minutes of Exercise per Session: 10 min  Stress: Stress Concern Present (01/10/2024)   [employer] of Occupational Health - Occupational Stress Questionnaire    Feeling of Stress : Very much  Social Connections: Socially Integrated (01/10/2024)   Social Connection and Isolation Panel    Frequency of Communication with Friends and Family: More than three times a week    Frequency of Social Gatherings with Friends and Family: Once a week    Attends Religious Services: More than 4 times per year    Active Member of Clubs or Organizations: Yes    Attends Banker Meetings: More than 4 times per year    Marital Status: Married  Depression (PHQ2-9): Medium Risk (09/27/2024)   Depression (PHQ2-9)    PHQ-2 Score: 6  Alcohol  Screen: Low Risk (01/10/2024)   Alcohol  Screen    Last Alcohol  Screening Score (AUDIT): 1  Housing: Low Risk (01/10/2024)   Housing Stability Vital Sign    Unable to Pay for Housing in the Last Year: No    Number of Times Moved in the Last Year: 0    Homeless in the Last Year: No  Utilities: Not At Risk (11/07/2023)   AHC Utilities    Threatened with loss of utilities: No  Health Literacy: Adequate Health Literacy (03/06/2024)   B1300 Health Literacy    Frequency of need for help with medical instructions: Never     Review of Systems  Constitutional: Negative.   Respiratory: Negative.    Cardiovascular: Negative.   Musculoskeletal:  Positive for arthralgias. Negative for back pain, gait problem, joint swelling, myalgias, neck pain and neck  stiffness.  Skin: Negative.   Neurological:  Positive for light-headedness and numbness (in her hands when she wakes up in the AM). Negative for dizziness, tremors, seizures, syncope, facial asymmetry, speech difficulty, weakness and headaches.  Psychiatric/Behavioral: Negative.      Per HPI unless specifically indicated above     Objective:    BP 132/62   Pulse 64   Temp 97.9 F (36.6 C) (Oral)   Ht 5' 1 (1.549 m)   Wt 153 lb (69.4 kg)   SpO2 98%   BMI 28.91 kg/m  Wt Readings from Last 3 Encounters:  09/27/24 153 lb (69.4 kg)  08/21/24 154 lb (69.9 kg)  08/08/24 155 lb 6.4 oz (70.5 kg)    Physical Exam Vitals and nursing note reviewed.  Constitutional:      General: She is not in acute distress.    Appearance: Normal appearance. She is not ill-appearing, toxic-appearing or diaphoretic.  HENT:     Head: Normocephalic and atraumatic.     Right Ear: External ear normal.     Left Ear: External ear normal.     Nose: Nose normal.     Mouth/Throat:     Mouth: Mucous membranes are moist.     Pharynx: Oropharynx is clear.  Eyes:     General: No scleral icterus.       Right eye: No discharge.        Left eye: No discharge.     Extraocular Movements: Extraocular movements intact.     Conjunctiva/sclera: Conjunctivae normal.     Pupils: Pupils are equal, round, and reactive to light.  Cardiovascular:     Rate and Rhythm: Normal rate and regular rhythm.     Pulses: Normal pulses.     Heart sounds: Normal heart sounds. No murmur heard.    No friction rub. No gallop.  Pulmonary:     Effort: Pulmonary effort is normal. No respiratory distress.     Breath sounds: Normal breath sounds. No stridor. No wheezing, rhonchi or rales.  Chest:     Chest wall: No tenderness.  Musculoskeletal:        General: Swelling (L knee) present. No tenderness, deformity or signs of injury. Normal range of motion.     Cervical back: Normal range of motion and neck supple.     Right lower leg:  No edema.     Left lower leg: No edema.  Skin:    General: Skin is warm and dry.     Capillary Refill: Capillary refill takes less than 2 seconds.     Coloration: Skin is not jaundiced or pale.     Findings: No bruising, erythema, lesion or rash.  Neurological:     General: No focal deficit present.     Mental Status: She is alert and oriented to person, place, and time. Mental status is at baseline.  Psychiatric:        Mood and Affect: Mood normal.        Behavior: Behavior normal.        Thought Content: Thought content normal.        Judgment: Judgment normal.     Results for orders placed or performed in visit on 08/21/24  Aldosterone + renin activity w/ ratio   Collection Time: 08/21/24  4:25 PM  Result Value Ref Range   Aldosterone 3.2 0.0 - 30.0 ng/dL   Renin Activity, Plasma 0.226 0.167 - 5.380 ng/mL/hr   Aldos/Renin Ratio 14.2 0.0 - 30.0      Assessment & Plan:   Problem List Items Addressed This Visit       Cardiovascular and Mediastinum   Essential hypertension - Primary   BP under good control. Continue current regimen. Continue to monitor. Recheck 6 weeks. Call with any concerns.       Relevant Medications   spironolactone  (ALDACTONE ) 100 MG tablet   Other Relevant Orders   CBC with Differential/Platelet   Comprehensive metabolic panel with GFR   TSH     Other   Hyperlipidemia   Rechecking labs today.  Await results. Treat as needed.       Relevant Medications   spironolactone  (ALDACTONE ) 100 MG tablet   Other Relevant Orders   Comprehensive metabolic panel with GFR   Lipid Panel w/o Chol/HDL Ratio   Other Visit Diagnoses       Acute pain of left knee       Will check x-ray and treat with voltaren . Recheck in about 4-6 weeks.   Relevant Orders   Uric acid   DG Knee Complete 4 Views Left     Hand paresthesia       Will check labs and x-rays. Await results. Treat as needed.   Relevant Orders   B12   DG Cervical Spine Complete   DG Thoracic  Spine W/Swimmers        Follow up plan: Return in about 6 weeks (around 11/08/2024).         [1]  Allergies Allergen Reactions   Amlodipine  Swelling    Lower extremity swelling   "

## 2024-09-27 NOTE — Assessment & Plan Note (Signed)
 BP under good control. Continue current regimen. Continue to monitor. Recheck 6 weeks. Call with any concerns.

## 2024-09-27 NOTE — Assessment & Plan Note (Signed)
 Rechecking labs today. Await results. Treat as needed.

## 2024-09-27 NOTE — Patient Outreach (Addendum)
 Late entry-referral received from providers office to assist patient with caregiver stress and community resources to assist with her spouses care needs.  This social contacted patient on 09/12/24 and dicussed/provided available resources related to private pay care to assist with spouse's daily care. Confirmed that patient would not be able to be a paid caregiver as spouse is not covered under Medicaid to be eligible for this program. Private pay care discussed as well as referral to [facility]. Patient confirms inability to pay privately, declined referral to [employer] program. Incontinent supply program discussed through [facility] as well as their respite care program. Patient denied having any further community resource needs at this time.   Shelle Galdamez, LCSW [place]  [facility], [facility] Licensed Clinical Social Worker  Direct Dial: [phone]

## 2024-09-28 ENCOUNTER — Ambulatory Visit: Payer: Self-pay | Admitting: Family Medicine

## 2024-09-28 LAB — CBC WITH DIFFERENTIAL/PLATELET
Basophils Absolute: 0 10*3/uL (ref 0.0–0.2)
Basos: 0 %
EOS (ABSOLUTE): 0.1 10*3/uL (ref 0.0–0.4)
Eos: 2 %
Hematocrit: 35.4 % (ref 34.0–46.6)
Hemoglobin: 12 g/dL (ref 11.1–15.9)
Immature Grans (Abs): 0 10*3/uL (ref 0.0–0.1)
Immature Granulocytes: 0 %
Lymphocytes Absolute: 0.8 10*3/uL (ref 0.7–3.1)
Lymphs: 17 %
MCH: 30.2 pg (ref 26.6–33.0)
MCHC: 33.9 g/dL (ref 31.5–35.7)
MCV: 89 fL (ref 79–97)
Monocytes Absolute: 0.5 10*3/uL (ref 0.1–0.9)
Monocytes: 10 %
Neutrophils Absolute: 3.4 10*3/uL (ref 1.4–7.0)
Neutrophils: 71 %
Platelets: 159 10*3/uL (ref 150–450)
RBC: 3.97 x10E6/uL (ref 3.77–5.28)
RDW: 12.3 % (ref 11.7–15.4)
WBC: 4.9 10*3/uL (ref 3.4–10.8)

## 2024-09-28 LAB — COMPREHENSIVE METABOLIC PANEL WITH GFR
ALT: 20 [IU]/L (ref 0–32)
AST: 24 [IU]/L (ref 0–40)
Albumin: 4.3 g/dL (ref 3.8–4.8)
Alkaline Phosphatase: 68 [IU]/L (ref 49–135)
BUN/Creatinine Ratio: 23 (ref 12–28)
BUN: 30 mg/dL — ABNORMAL HIGH (ref 8–27)
Bilirubin Total: 0.4 mg/dL (ref 0.0–1.2)
CO2: 20 mmol/L (ref 20–29)
Calcium: 9 mg/dL (ref 8.7–10.3)
Chloride: 99 mmol/L (ref 96–106)
Creatinine, Ser: 1.31 mg/dL — ABNORMAL HIGH (ref 0.57–1.00)
Globulin, Total: 2.2 g/dL (ref 1.5–4.5)
Glucose: 148 mg/dL — ABNORMAL HIGH (ref 70–99)
Potassium: 4.6 mmol/L (ref 3.5–5.2)
Sodium: 133 mmol/L — ABNORMAL LOW (ref 134–144)
Total Protein: 6.5 g/dL (ref 6.0–8.5)
eGFR: 41 mL/min/{1.73_m2} — ABNORMAL LOW

## 2024-09-28 LAB — TSH: TSH: 2.22 u[IU]/mL (ref 0.450–4.500)

## 2024-09-28 LAB — LIPID PANEL W/O CHOL/HDL RATIO
Cholesterol, Total: 93 mg/dL — ABNORMAL LOW (ref 100–199)
HDL: 46 mg/dL
LDL Chol Calc (NIH): 29 mg/dL (ref 0–99)
Triglycerides: 89 mg/dL (ref 0–149)
VLDL Cholesterol Cal: 18 mg/dL (ref 5–40)

## 2024-09-28 LAB — VITAMIN B12: Vitamin B-12: 428 pg/mL (ref 232–1245)

## 2024-09-28 LAB — URIC ACID: Uric Acid: 8.3 mg/dL — ABNORMAL HIGH (ref 3.1–7.9)

## 2024-09-28 MED ORDER — COLCHICINE 0.6 MG PO TABS: 0.6000 mg | ORAL_TABLET | Freq: Every day | ORAL | 0 refills | Status: AC

## 2024-09-28 MED ORDER — VITAMIN B-12 1000 MCG PO TABS: 1000.0000 ug | ORAL_TABLET | Freq: Every day | ORAL | 3 refills | Status: AC

## 2024-09-28 MED ORDER — ALLOPURINOL 100 MG PO TABS: 100.0000 mg | ORAL_TABLET | Freq: Every day | ORAL | 1 refills | Status: AC

## 2024-10-01 ENCOUNTER — Ambulatory Visit: Payer: Self-pay

## 2024-10-01 NOTE — Telephone Encounter (Signed)
 Pt spoke to NT, see other documentation.   Copied from CRM #[id number]. Topic: Clinical - Medical Advice >> Oct 01, 2024 10:15 AM Terri MATSU wrote: Reason for CRM: Patient stated Dr.Johnson told her to go to [facility] Friday to get an xray of hher knee but when she got there it was closed . Please advised . Callback number [phone]

## 2024-10-03 ENCOUNTER — Telehealth: Payer: Self-pay | Admitting: Family Medicine

## 2024-10-03 NOTE — Telephone Encounter (Unsigned)
 Copied from CRM [id number]. Topic: General - Other >> Oct 03, 2024 12:21 PM Edsel HERO wrote: Patient states that she called Monday to schedule the appointment to have the imaging done on her knee and was told they did not have imaging orders for her. Patient chart shows orders from 09/27/24. Please advise.

## 2024-10-04 ENCOUNTER — Ambulatory Visit
Admission: RE | Admit: 2024-10-04 | Discharge: 2024-10-04 | Disposition: A | Source: Ambulatory Visit | Attending: Family Medicine

## 2024-10-04 DIAGNOSIS — R202 Paresthesia of skin: Secondary | ICD-10-CM

## 2024-10-04 DIAGNOSIS — M25562 Pain in left knee: Secondary | ICD-10-CM

## 2024-10-04 NOTE — Telephone Encounter (Signed)
 There are orders in the chart. She needs to just go to [place] to have her x-rays done. I'm not sure what this is talking about?

## 2024-10-04 NOTE — Telephone Encounter (Signed)
 Left voice message for patient

## 2024-10-05 ENCOUNTER — Ambulatory Visit: Payer: Self-pay | Admitting: *Deleted

## 2024-10-05 NOTE — Telephone Encounter (Signed)
 Left voice message for patient today and yesterday. Sent mychart message yesterday.

## 2024-10-05 NOTE — Telephone Encounter (Signed)
 She needs to go get her x-ray

## 2024-10-05 NOTE — Telephone Encounter (Signed)
 FYI Only or Action Required?: Action required by provider: request for appointment and update on patient condition.  Patient was last seen in primary care on 09/27/2024 by Vicci Duwaine SQUIBB, DO.  Called Nurse Triage reporting Knee Pain.  Symptoms began several weeks ago.  Interventions attempted: Prescription medications: Allopurinol  .  Symptoms are: unchanged.  Triage Disposition: See HCP Within 4 Hours (Or PCP Triage)  Patient/caregiver understands and will follow disposition?: No, wishes to speak with PCP  Message from [place] D sent at 10/05/2024 11:35 AM EST  Reason for Triage: Pt is calling she is in pain still in the Left leg /Knee States pain meds she was prescribed are not working as she is still in a lot of pain   Reason for Disposition  [1] SEVERE pain (e.g., excruciating, unable to walk) AND [2] not improved after 2 hours of pain medicine  Answer Assessment - Initial Assessment Questions Patient reports OV 1/21 with diagnosis of gout- left knee pain/swelling. Patient reports medication did not help she is still have severe pain 10/10. Patient states she took he son's gout medication and it helped better than what she was prescribed: Indomethacin 50mg . Appointment offered at alternative location- patient declines- wants to see PCP next week. Will send message for PCP response and scheduling.     1. LOCATION and RADIATION: Where is the pain located?      Left knee 2. QUALITY: What does the pain feel like?  (e.g., sharp, dull, aching, burning)     Feels like a knife below the knee cutting the skin away, sharp 3. SEVERITY: How bad is the pain? What does it keep you from doing?   (Scale 1-10; or mild, moderate, severe)     10/10- no change 4. ONSET: When did the pain start? Does it come and go, or is it there all the time?     OV 1/21- being treated for gout 5. RECURRENT: Have you had this pain before? If Yes, ask: When, and what happened then?     no 6.  SETTING: Has there been any recent work, exercise or other activity that involved that part of the body?      Diagnosed gout 7. AGGRAVATING FACTORS: What makes the knee pain worse? (e.g., walking, climbing stairs, running)     Pain is worse with movement 8. ASSOCIATED SYMPTOMS: Is there any swelling or redness of the knee?     Swelling in the knee 9. OTHER SYMPTOMS: Do you have any other symptoms? (e.g., calf pain, chest pain, difficulty breathing, fever)     no  Protocols used: Knee Pain-A-AH

## 2024-10-10 ENCOUNTER — Ambulatory Visit

## 2024-11-08 ENCOUNTER — Ambulatory Visit: Admitting: Family Medicine

## 2025-03-12 ENCOUNTER — Ambulatory Visit
